# Patient Record
Sex: Male | Born: 1970
Health system: Southern US, Community
[De-identification: ages and names within clinical notes are randomized; demographics above are authoritative.]

## PROBLEM LIST (undated history)

## (undated) DIAGNOSIS — I420 Dilated cardiomyopathy: Secondary | ICD-10-CM

## (undated) DIAGNOSIS — I6529 Occlusion and stenosis of unspecified carotid artery: Secondary | ICD-10-CM

## (undated) DIAGNOSIS — E2609 Other primary hyperaldosteronism: Secondary | ICD-10-CM

## (undated) DIAGNOSIS — I77811 Abdominal aortic ectasia: Secondary | ICD-10-CM

## (undated) DIAGNOSIS — E785 Hyperlipidemia, unspecified: Secondary | ICD-10-CM

## (undated) DIAGNOSIS — I428 Other cardiomyopathies: Secondary | ICD-10-CM

## (undated) DIAGNOSIS — I110 Hypertensive heart disease with heart failure: Secondary | ICD-10-CM

## (undated) DIAGNOSIS — I701 Atherosclerosis of renal artery: Secondary | ICD-10-CM

## (undated) DIAGNOSIS — I2510 Atherosclerotic heart disease of native coronary artery without angina pectoris: Secondary | ICD-10-CM

## (undated) DIAGNOSIS — G40909 Epilepsy, unspecified, not intractable, without status epilepticus: Secondary | ICD-10-CM

## (undated) DIAGNOSIS — I619 Nontraumatic intracerebral hemorrhage, unspecified: Secondary | ICD-10-CM

## (undated) DIAGNOSIS — E119 Type 2 diabetes mellitus without complications: Secondary | ICD-10-CM

## (undated) HISTORY — DX: Occlusion and stenosis of unspecified carotid artery: I65.29

## (undated) HISTORY — DX: Abdominal aortic ectasia: I77.811

## (undated) HISTORY — DX: Dilated cardiomyopathy: I42.0

## (undated) HISTORY — DX: Nontraumatic intracerebral hemorrhage, unspecified: I61.9

## (undated) HISTORY — DX: Atherosclerotic heart disease of native coronary artery without angina pectoris: I25.10

## (undated) HISTORY — DX: Epilepsy, unspecified, not intractable, without status epilepticus: G40.909

## (undated) HISTORY — DX: Atherosclerosis of renal artery: I70.1

## (undated) HISTORY — DX: Type 2 diabetes mellitus without complications: E11.9

## (undated) HISTORY — DX: Hyperlipidemia, unspecified: E78.5

## (undated) HISTORY — DX: Other cardiomyopathies: I42.8

## (undated) HISTORY — DX: Other primary hyperaldosteronism: E26.09

## (undated) HISTORY — DX: Hypertensive heart disease with heart failure: I11.0

---

## 2011-07-10 ENCOUNTER — Other Ambulatory Visit: Payer: Self-pay

## 2011-07-10 ENCOUNTER — Encounter (HOSPITAL_COMMUNITY): Payer: Self-pay | Admitting: Emergency Medicine

## 2011-07-10 ENCOUNTER — Emergency Department (HOSPITAL_COMMUNITY): Payer: Self-pay

## 2011-07-10 ENCOUNTER — Emergency Department (HOSPITAL_COMMUNITY)
Admission: EM | Admit: 2011-07-10 | Discharge: 2011-07-10 | Disposition: A | Discharge status: Home / Self Care | Payer: Self-pay | Attending: Emergency Medicine | Admitting: Emergency Medicine

## 2011-07-10 DIAGNOSIS — I10 Essential (primary) hypertension: Secondary | ICD-10-CM | POA: Insufficient documentation

## 2011-07-10 DIAGNOSIS — R079 Chest pain, unspecified: Secondary | ICD-10-CM | POA: Insufficient documentation

## 2011-07-10 DIAGNOSIS — R0602 Shortness of breath: Secondary | ICD-10-CM | POA: Insufficient documentation

## 2011-07-10 DIAGNOSIS — I498 Other specified cardiac arrhythmias: Secondary | ICD-10-CM | POA: Insufficient documentation

## 2011-07-10 DIAGNOSIS — I471 Supraventricular tachycardia: Secondary | ICD-10-CM

## 2011-07-10 LAB — CBC
HCT: 51 % (ref 39.0–52.0)
MCHC: 35.1 g/dL (ref 30.0–36.0)
RDW: 13.5 % (ref 11.5–15.5)

## 2011-07-10 LAB — BASIC METABOLIC PANEL
BUN: 13 mg/dL (ref 6–23)
Creatinine, Ser: 1.1 mg/dL (ref 0.50–1.35)
GFR calc Af Amer: >90 mL/min (ref >90)
GFR calc non Af Amer: 83 mL/min — ABNORMAL LOW (ref >90)
Potassium: 3 mEq/L — ABNORMAL LOW (ref 3.5–5.1)

## 2011-07-10 MED ORDER — METOPROLOL TARTRATE 25 MG PO TABS: 25.0000 mg | ORAL_TABLET | Freq: Two times a day (BID) | ORAL | Status: DC

## 2011-07-10 MED ORDER — ADENOSINE 6 MG/2ML IV SOLN
INTRAVENOUS | Status: AC
  Filled 2011-07-10: qty 10

## 2011-07-10 MED ORDER — LABETALOL HCL 5 MG/ML IV SOLN
INTRAVENOUS | Status: AC
  Administered 2011-07-10: 10 mg via INTRAVENOUS
  Filled 2011-07-10: qty 4

## 2011-07-10 MED ORDER — CLONIDINE HCL 0.1 MG PO TABS
0.1000 mg | ORAL_TABLET | Freq: Once | ORAL | Status: AC
  Administered 2011-07-10: 0.1 mg via ORAL
  Filled 2011-07-10: qty 1

## 2011-07-10 MED ORDER — LABETALOL HCL 5 MG/ML IV SOLN
10.0000 mg | Freq: Once | INTRAVENOUS | Status: AC
  Administered 2011-07-10: 10 mg via INTRAVENOUS

## 2011-07-10 MED ORDER — LABETALOL HCL 5 MG/ML IV SOLN
INTRAVENOUS | Status: AC
  Filled 2011-07-10: qty 4

## 2011-07-10 MED ORDER — ADENOSINE 6 MG/2ML IV SOLN
6.0000 mg | Freq: Once | INTRAVENOUS | Status: AC
  Administered 2011-07-10: 6 mg via INTRAVENOUS

## 2011-07-10 MED ORDER — ADENOSINE 6 MG/2ML IV SOLN
12.0000 mg | Freq: Once | INTRAVENOUS | Status: AC
  Administered 2011-07-10: 12 mg via INTRAVENOUS

## 2011-07-10 NOTE — ED Notes (Signed)
Dr. Leary Roca is at the bedside.

## 2011-07-10 NOTE — ED Notes (Signed)
Dr. Leary Roca was made aware about patient having persistent elevated BP.

## 2011-07-10 NOTE — ED Notes (Signed)
Blood pressure remains elevated, medicated with Catapres 0.1 mg po as ordered. Will continue to monitor.

## 2011-07-10 NOTE — ED Notes (Signed)
RN notified of elevated B/p

## 2011-07-10 NOTE — ED Notes (Signed)
Pt sts woke up with palpitations, SOB and CP; pt noted to be tachy at present approx 140bpm; pt denies hx of same

## 2011-07-10 NOTE — ED Notes (Signed)
D/c I/v per charge RN

## 2011-07-10 NOTE — ED Provider Notes (Signed)
History     CSN: [id number]  Arrival date & time 07/10/11  1024   First MD Initiated Contact with Patient 07/10/11 1041      Chief Complaint  Patient presents with  . Palpitations  . Shortness of Breath  . Chest Pain    (Consider location/radiation/quality/duration/timing/severity/associated sxs/prior treatment) Patient is a 41 y.o. male presenting with palpitations. The history is provided by the patient.  Palpitations  This is a recurrent problem. The current episode started yesterday. The problem occurs constantly. The problem has been gradually worsening. The problem is associated with an unknown factor. Associated symptoms include irregular heartbeat, headaches (this morning, since resolved) and shortness of breath. Pertinent negatives include no diaphoresis, no fever, no malaise/fatigue, no chest pain, no chest pressure, no claudication, no near-syncope, no abdominal pain, no nausea, no vomiting, no leg pain, no lower extremity edema, no dizziness and no cough. He has tried nothing for the symptoms. Risk factors include smoking/tobacco exposure and being male. His past medical history does not include heart disease or hyperthyroidism.    History reviewed. No pertinent past medical history.  History reviewed. No pertinent past surgical history.  History reviewed. No pertinent family history.  History  Substance Use Topics  . Smoking status: Current Everyday Smoker  . Smokeless tobacco: Not on file  . Alcohol Use: Yes     occasional      Review of Systems  Unable to perform ROS Constitutional: Negative for fever, chills, malaise/fatigue and diaphoresis.  Respiratory: Positive for shortness of breath. Negative for cough and chest tightness.   Cardiovascular: Positive for palpitations. Negative for chest pain, claudication and near-syncope.  Gastrointestinal: Negative for nausea, vomiting and abdominal pain.  Musculoskeletal: Negative for myalgias and arthralgias.  Skin:  Negative for pallor and rash.  Neurological: Positive for headaches (this morning, since resolved). Negative for dizziness and light-headedness.  All other systems reviewed and are negative.    Allergies  Review of patient's allergies indicates no known allergies.  Home Medications   Current Outpatient Rx  Name Route Sig Dispense Refill  . ASPIRIN EC 81 MG PO TBEC Oral Take 81 mg by mouth daily as needed. For pain      Pulse 140  SpO2 100%  Physical Exam  Nursing note and vitals reviewed. Constitutional: He is oriented to person, place, and time. He appears well-developed and well-nourished.  HENT:  Head: Normocephalic and atraumatic.  Eyes: EOM are normal. Pupils are equal, round, and reactive to light.  Cardiovascular: Regular rhythm, normal heart sounds and intact distal pulses.  Tachycardia present.   Pulmonary/Chest: Effort normal and breath sounds normal. No respiratory distress.  Abdominal: Soft. There is no tenderness.  Neurological: He is alert and oriented to person, place, and time.  Skin: Skin is warm and dry.  Psychiatric: He has a normal mood and affect.    ED Course  Procedures (including critical care time)  Labs Reviewed  CBC - Abnormal; Notable for the following:    RBC 6.29 (*)     Hemoglobin 17.9 (*)     All other components within normal limits  BASIC METABOLIC PANEL - Abnormal; Notable for the following:    Potassium 3.0 (*)     Glucose, Bld 123 (*)     GFR calc non Af Amer 83 (*)     All other components within normal limits  POCT I-STAT TROPONIN I   Dg Chest 2 View  07/10/2011  *RADIOLOGY REPORT*  Clinical Data: Palpitations and shortness  of breath/chest pain.  CHEST - 2 VIEW  Comparison: None.  Findings: Midline trachea.  Mild cardiomegaly with tortuous thoracic aorta. Mediastinal contours otherwise within normal limits.  No pleural effusion or pneumothorax.  EKG lead artifacts project over the upper lobes bilaterally.  Mild interstitial  prominence.  IMPRESSION: Cardiomegaly.  Interstitial prominence suspicious for pulmonary venous congestion.  Original Report Authenticated By: Consuello Bossier, M.D.     Date: 07/10/2011 @1029   Rate: 139  Rhythm: supraventricular tachycardia (SVT)  QRS Axis: left  Intervals: normal  ST/T Wave abnormalities: ST depressions inferiorly and ST depressions laterally V4-V6, II, III, aVF  Conduction Disutrbances:none  Narrative Interpretation:   Old EKG Reviewed: none available   Date: 07/10/2011 @1129   Rate: 83  Rhythm: normal sinus rhythm  QRS Axis: normal  Intervals: PR prolonged  ST/T Wave abnormalities: nonspecific T wave changes, ST depressions inferiorly and ST depressions laterally <1 mm ST depression in II, V5-V6; TWI V4, III, aVF  Conduction Disutrbances:first-degree A-V block   Narrative Interpretation:   Old EKG Reviewed: changes noted p waves present, improvement of ST depressions    1. SVT (supraventricular tachycardia)   2. Hypertension       MDM  This is a 41 year old male who presents with palpitations and feeling like his heart is racing since last night, with mild headache this morning that has since resolved, and feelings of shortness of breath but still persists. The patient states that he has previously had similar episodes of palpitations, however they have not been as severe, they resolved on their own, and were not associated with any shortness of breath. He denies any chest pain at any point in time during this episode. He denies any drug use, over-the-counter medications, any recent infectious symptoms, any previous diagnosed history of dysrhythmias, or any known family history of dysrhythmias. He states he takes an intermittent PPS, but no other medications. He says he has previously been told he had hypertension, however has never been started on any medications. At this time the patient's overall well-appearing, is tachycardic with an EKG showing SVT; there are  some ST depressions noted, however at this point in time he'll consider they are most likely rate related. Patient was given 6 mg of adenosine without any benefit, followed by 12 mg of adenosine which resulted in conversion to a normal sinus rhythm. The patient tolerated this well without problems. A repeat EKG does still show some mild depressions, however if he rebleeds and with improvement in the depth of the depressions. The patient is persistently hypertensive, so will give labetalol to help treat this. We'll check basic labs to evaluate for possible etiology of his new SVT.  Labs unremarkable. Chest x-ray does show cardiomegaly and some possible pulmonary venous congestion. This is consistent with the patient's hypertension, which is likely chronic. Spoke with Trish from [facility] cardiology due to his new-onset SVT, hypertension, and cardiomegaly on chest x-ray. They will call the patient in the morning to set up close followup with him. In the interim we'll start the patient on low-dose beta blocker to help control his blood pressure as well as his heart rate. Advised patient to check his blood pressure as he is able, to keep track of these numbers, and the importance of followup with cardiology. The patient expresses understanding of this plan.    Theotis Burrow, MD 07/10/11 [phone]

## 2011-07-11 NOTE — ED Provider Notes (Signed)
I saw and evaluated the patient, reviewed the resident's note and I agree with the findings and plan.   Loren Racer, MD 07/11/11 [phone]

## 2012-04-14 ENCOUNTER — Encounter (HOSPITAL_COMMUNITY): Payer: Self-pay | Admitting: Emergency Medicine

## 2012-04-14 ENCOUNTER — Emergency Department (HOSPITAL_COMMUNITY)
Admission: EM | Admit: 2012-04-14 | Discharge: 2012-04-14 | Disposition: A | Discharge status: Home / Self Care | Payer: Self-pay | Attending: Emergency Medicine | Admitting: Emergency Medicine

## 2012-04-14 DIAGNOSIS — F411 Generalized anxiety disorder: Secondary | ICD-10-CM | POA: Insufficient documentation

## 2012-04-14 DIAGNOSIS — M5430 Sciatica, unspecified side: Secondary | ICD-10-CM | POA: Insufficient documentation

## 2012-04-14 DIAGNOSIS — F17200 Nicotine dependence, unspecified, uncomplicated: Secondary | ICD-10-CM | POA: Insufficient documentation

## 2012-04-14 DIAGNOSIS — I10 Essential (primary) hypertension: Secondary | ICD-10-CM | POA: Insufficient documentation

## 2012-04-14 DIAGNOSIS — M5432 Sciatica, left side: Secondary | ICD-10-CM

## 2012-04-14 DIAGNOSIS — M79609 Pain in unspecified limb: Secondary | ICD-10-CM | POA: Insufficient documentation

## 2012-04-14 MED ORDER — LISINOPRIL 20 MG PO TABS
20.0000 mg | ORAL_TABLET | Freq: Once | ORAL | Status: AC
  Administered 2012-04-14: 20 mg via ORAL
  Filled 2012-04-14: qty 1

## 2012-04-14 MED ORDER — LISINOPRIL 20 MG PO TABS: 20.0000 mg | ORAL_TABLET | Freq: Every day | ORAL | Status: DC

## 2012-04-14 MED ORDER — CYCLOBENZAPRINE HCL 10 MG PO TABS
5.0000 mg | ORAL_TABLET | Freq: Once | ORAL | Status: AC
  Administered 2012-04-14: 5 mg via ORAL
  Filled 2012-04-14: qty 1

## 2012-04-14 MED ORDER — IBUPROFEN 800 MG PO TABS: 800.0000 mg | ORAL_TABLET | Freq: Three times a day (TID) | ORAL | Status: DC

## 2012-04-14 MED ORDER — CYCLOBENZAPRINE HCL 10 MG PO TABS: 10.0000 mg | ORAL_TABLET | Freq: Two times a day (BID) | ORAL | Status: DC | PRN

## 2012-04-14 MED ORDER — FENTANYL CITRATE 0.05 MG/ML IJ SOLN
50.0000 ug | Freq: Once | INTRAMUSCULAR | Status: AC
  Administered 2012-04-14: 50 ug via INTRAMUSCULAR
  Filled 2012-04-14: qty 2

## 2012-04-14 MED ORDER — METOPROLOL TARTRATE 50 MG PO TABS: 50.0000 mg | ORAL_TABLET | Freq: Two times a day (BID) | ORAL | Status: DC

## 2012-04-14 MED ORDER — METOPROLOL TARTRATE 25 MG PO TABS
50.0000 mg | ORAL_TABLET | Freq: Once | ORAL | Status: AC
  Administered 2012-04-14: 50 mg via ORAL
  Filled 2012-04-14: qty 2

## 2012-04-14 NOTE — ED Provider Notes (Signed)
History     CSN: [id number]  Arrival date & time 04/14/12  0814   First MD Initiated Contact with Patient 04/14/12 [phone]      Chief Complaint  Patient presents with  . Back Pain  . Leg Pain    (Consider location/radiation/quality/duration/timing/severity/associated sxs/prior treatment) The history is provided by the patient.  Steven Higgins is a 42 y.o. male history of hypertension, medication noncompliance, here with left leg pain. Start with back pain that radiated down to the left leg for the last 3 weeks. Denies injury or trauma. He said the pain radiated down to his toes on the left leg. No fevers or chills or urinary incontinence or bowel incontinence. Denies vomiting. He has not been taking his metoprolol for over 3 months because he cannot follow up with a doctor due to lack of insurance. No chest pain or SOB.    Past Medical History  Diagnosis Date  . Hypertension     History reviewed. No pertinent past surgical history.  History reviewed. No pertinent family history.  History  Substance Use Topics  . Smoking status: Current Every Day Smoker  . Smokeless tobacco: Not on file  . Alcohol Use: Yes     Comment: occasional      Review of Systems  Musculoskeletal: Positive for back pain.       Leg pain   All other systems reviewed and are negative.    Allergies  Review of patient's allergies indicates no known allergies.  Home Medications   Current Outpatient Rx  Name  Route  Sig  Dispense  Refill  . aspirin EC 81 MG tablet   Oral   Take 81 mg by mouth daily as needed. For pain         . ibuprofen (ADVIL,MOTRIN) 200 MG tablet   Oral   Take 400-600 mg by mouth 3 (three) times daily as needed for pain.         . Menthol-Methyl Salicylate (MUSCLE RUB EX)   Apply externally   Apply 1 application topically every 4 (four) hours as needed (pain).           BP 198/125  Pulse 63  Temp(Src) 97.4 F (36.3 C) (Oral)  Resp 18  SpO2 98%  Physical  Exam  Nursing note and vitals reviewed. Constitutional: He is oriented to person, place, and time. He appears well-developed and well-nourished.  Slightly anxious, uncomfortable   HENT:  Head: Normocephalic.  Mouth/Throat: Oropharynx is clear and moist.  Eyes: Conjunctivae are normal. Pupils are equal, round, and reactive to light.  Neck: Normal range of motion. Neck supple.  Cardiovascular: Normal rate, regular rhythm and normal heart sounds.   Pulmonary/Chest: Effort normal and breath sounds normal. No respiratory distress. He has no wheezes. He has no rales.  Abdominal: Soft. Bowel sounds are normal. He exhibits no distension. There is no tenderness. There is no rebound and no guarding.  Musculoskeletal: Normal range of motion.  Positive straight leg raise on L side, + L paralumbar tenderness   Neurological: He is alert and oriented to person, place, and time.  No saddle anesthesia   Skin: Skin is warm and dry.  Psychiatric: He has a normal mood and affect. His behavior is normal. Judgment and thought content normal.    ED Course  Procedures (including critical care time)  Labs Reviewed - No data to display No results found.   No diagnosis found.    MDM  Steven Higgins is a 42 y.o.  male here with L leg pain. Likely sciatica. Also hypertensive but no evidence of end organ damage. Will give pain meds and bp meds and reassess.   11:36 AM Pain improved, BP improved with metoprolol. Will d/c home on metoprolol and lisinopril. Gave him a list of PMD to f/u with. Also prescribed motrin and flexeril for sciatica. Return precautions given.          Richardean Canal, MD 04/14/12 [phone]

## 2012-04-14 NOTE — ED Notes (Signed)
Pt sts left lower back pain with radiation down left leg x 3 weeks; pt noted to be hypertensive and sts has not had meds x 3 months

## 2012-04-21 ENCOUNTER — Emergency Department (HOSPITAL_COMMUNITY)
Admission: EM | Admit: 2012-04-21 | Discharge: 2012-04-21 | Disposition: A | Discharge status: Home / Self Care | Payer: Self-pay | Attending: Emergency Medicine | Admitting: Emergency Medicine

## 2012-04-21 ENCOUNTER — Encounter (HOSPITAL_COMMUNITY): Payer: Self-pay | Admitting: *Deleted

## 2012-04-21 DIAGNOSIS — F17200 Nicotine dependence, unspecified, uncomplicated: Secondary | ICD-10-CM | POA: Insufficient documentation

## 2012-04-21 DIAGNOSIS — M549 Dorsalgia, unspecified: Secondary | ICD-10-CM | POA: Insufficient documentation

## 2012-04-21 DIAGNOSIS — M5430 Sciatica, unspecified side: Secondary | ICD-10-CM | POA: Insufficient documentation

## 2012-04-21 DIAGNOSIS — R32 Unspecified urinary incontinence: Secondary | ICD-10-CM | POA: Insufficient documentation

## 2012-04-21 DIAGNOSIS — I10 Essential (primary) hypertension: Secondary | ICD-10-CM

## 2012-04-21 DIAGNOSIS — E876 Hypokalemia: Secondary | ICD-10-CM

## 2012-04-21 DIAGNOSIS — M5432 Sciatica, left side: Secondary | ICD-10-CM

## 2012-04-21 LAB — POCT I-STAT, CHEM 8
BUN: 12 mg/dL (ref 6–23)
Chloride: 95 mEq/L — ABNORMAL LOW (ref 96–112)
HCT: 53 % — ABNORMAL HIGH (ref 39.0–52.0)
Sodium: 140 mEq/L (ref 135–145)
TCO2: 37 mmol/L (ref 0–100)

## 2012-04-21 MED ORDER — ONDANSETRON 4 MG PO TBDP
4.0000 mg | ORAL_TABLET | Freq: Once | ORAL | Status: AC
  Administered 2012-04-21: 4 mg via ORAL
  Filled 2012-04-21: qty 1

## 2012-04-21 MED ORDER — PREDNISONE 20 MG PO TABS: 40.0000 mg | ORAL_TABLET | Freq: Every day | ORAL | Status: DC

## 2012-04-21 MED ORDER — OXYCODONE-ACETAMINOPHEN 5-325 MG PO TABS: 1.0000 | ORAL_TABLET | Freq: Four times a day (QID) | ORAL | Status: DC | PRN

## 2012-04-21 MED ORDER — METOPROLOL TARTRATE 25 MG PO TABS
50.0000 mg | ORAL_TABLET | Freq: Two times a day (BID) | ORAL | Status: DC
  Administered 2012-04-21: 50 mg via ORAL
  Filled 2012-04-21: qty 2

## 2012-04-21 MED ORDER — HYDROMORPHONE HCL PF 2 MG/ML IJ SOLN
2.0000 mg | Freq: Once | INTRAMUSCULAR | Status: AC
  Administered 2012-04-21: 2 mg via INTRAMUSCULAR
  Filled 2012-04-21: qty 1

## 2012-04-21 MED ORDER — POTASSIUM CHLORIDE CRYS ER 20 MEQ PO TBCR
80.0000 meq | EXTENDED_RELEASE_TABLET | Freq: Once | ORAL | Status: AC
  Administered 2012-04-21: 80 meq via ORAL
  Filled 2012-04-21: qty 4

## 2012-04-21 MED ORDER — POTASSIUM CHLORIDE CRYS ER 20 MEQ PO TBCR: 20.0000 meq | EXTENDED_RELEASE_TABLET | Freq: Two times a day (BID) | ORAL | Status: DC

## 2012-04-21 NOTE — ED Notes (Signed)
MD at bedside. 

## 2012-04-21 NOTE — ED Notes (Signed)
Dr. Anitra Lauth made aware of pts. Pain and hypertension

## 2012-04-21 NOTE — ED Notes (Signed)
Patient moved to room 29  Report given

## 2012-04-21 NOTE — ED Notes (Signed)
Pt is here with left leg pain that starts at lower buttocks area and down leg.  Strong pedal pulse

## 2012-04-21 NOTE — ED Provider Notes (Addendum)
History     CSN: [id number]  Arrival date & time 04/21/12  1541   First MD Initiated Contact with Patient 04/21/12 1811      Chief Complaint  Patient presents with  . Leg Pain    (Consider location/radiation/quality/duration/timing/severity/associated sxs/prior treatment) Patient is a 42 y.o. male presenting with leg pain. The history is provided by the patient.  Leg Pain Location:  Buttock Time since incident:  2 weeks Injury: no   Buttock location:  L buttock Pain details:    Quality:  Throbbing, sharp, burning and aching   Radiates to:  L leg (to the outer 3 toes)   Severity:  Severe   Onset quality:  Gradual   Duration:  2 weeks   Timing:  Constant   Progression:  Worsening Chronicity:  New Prior injury to area:  No Relieved by:  Nothing Worsened by:  Bearing weight and activity Ineffective treatments:  NSAIDs and muscle relaxant Associated symptoms: back pain   Associated symptoms: no decreased ROM, no fever, no muscle weakness, no neck pain, no numbness, no swelling and no tingling   Associated symptoms comment:  Urinary incontinence or retention. No bowel incontinence Risk factors: no frequent fractures and no recent illness     Past Medical History  Diagnosis Date  . Hypertension     History reviewed. No pertinent past surgical history.  No family history on file.  History  Substance Use Topics  . Smoking status: Current Every Day Smoker  . Smokeless tobacco: Not on file  . Alcohol Use: Yes     Comment: occasional      Review of Systems  Constitutional: Negative for fever.  HENT: Negative for neck pain.   Respiratory: Negative for cough and shortness of breath.   Cardiovascular: Negative for chest pain.       Pt states his BP is running high since the pain started.  170's last night.  Musculoskeletal: Positive for back pain.  Neurological: Negative for headaches.  All other systems reviewed and are negative.    Allergies  Review of  patient's allergies indicates no known allergies.  Home Medications   Current Outpatient Rx  Name  Route  Sig  Dispense  Refill  . cyclobenzaprine (FLEXERIL) 10 MG tablet   Oral   Take 1 tablet (10 mg total) by mouth 2 (two) times daily as needed for muscle spasms.   20 tablet   0   . ibuprofen (ADVIL,MOTRIN) 800 MG tablet   Oral   Take 1 tablet (800 mg total) by mouth 3 (three) times daily.   21 tablet   0   . lisinopril (PRINIVIL,ZESTRIL) 20 MG tablet   Oral   Take 1 tablet (20 mg total) by mouth daily.   30 tablet   0   . metoprolol (LOPRESSOR) 50 MG tablet   Oral   Take 1 tablet (50 mg total) by mouth 2 (two) times daily.   60 tablet   0     BP 219/145  Pulse 61  Temp(Src) 98 F (36.7 C) (Oral)  Resp 14  SpO2 98%  Physical Exam  Nursing note and vitals reviewed. Constitutional: He is oriented to person, place, and time. He appears well-developed and well-nourished. He appears distressed.  HENT:  Head: Normocephalic and atraumatic.  Mouth/Throat: Oropharynx is clear and moist.  Eyes: Conjunctivae and EOM are normal. Pupils are equal, round, and reactive to light.  Neck: Normal range of motion. Neck supple.  Cardiovascular: Normal rate, regular  rhythm and intact distal pulses.   No murmur heard. Pulmonary/Chest: Effort normal and breath sounds normal. No respiratory distress. He has no wheezes. He has no rales.  Abdominal: Soft. He exhibits no distension. There is no tenderness. There is no rebound and no guarding.  Musculoskeletal: Normal range of motion. He exhibits no edema and no tenderness.       Lumbar back: He exhibits tenderness, pain and spasm. He exhibits normal pulse.       Back:  Neurological: He is alert and oriented to person, place, and time. He has normal strength. No sensory deficit.  Reflex Scores:      Patellar reflexes are 2+ on the left side. Skin: Skin is warm and dry. No rash noted. No erythema.  Psychiatric: He has a normal mood and  affect. His behavior is normal.    ED Course  Procedures (including critical care time)  Labs Reviewed  POCT I-STAT, CHEM 8 - Abnormal; Notable for the following:    Potassium 2.6 (*)    Chloride 95 (*)    Glucose, Bld 111 (*)    Calcium, Ion 1.09 (*)    Hemoglobin 18.0 (*)    HCT 53.0 (*)    All other components within normal limits   No results found.   Date: 04/21/2012  Rate: 70  Rhythm: normal sinus rhythm  QRS Axis: normal  Intervals: normal  ST/T Wave abnormalities: nonspecific ST/T changes  Conduction Disutrbances:first-degree A-V block  and LVH  Narrative Interpretation:   Old EKG Reviewed: unchanged   1. Hypertension   2. Hypokalemia   3. Sciatica of left side       MDM   Pt with gradual onset of back pain suggestive of radiculopathy.  No neurovascular compromise and no incontinence.  Pt has no infectious sx, hx of CA  or other red flags concerning for pathologic back pain.  Pt is able to ambulate but is painful.  Normal strength and reflexes on exam.  Denies trauma.  Patient seen approximately last week has been taking ibuprofen and Flexeril without improvement in his sciatica-type symptoms Will give pt pain control and to return for developement of above sx. Also patient is hypertensive today but is asymptomatic from his hypertension. EKG shows signs of LVH but no acute findings. He denies any chest pain, shortness of breath. He has taken his blood pressure medications today.  8:36 PM Pt feeling much better after pain meds.  Given evening dose of his meds.  Repeat BP 191/120.  Still assymptomatic. I-stat with hypokalemia of 2.6 which is most likely from lisinopril.  Orally replaced and pt will be given supplement.    Gwyneth Sprout, MD 04/21/12 1610  Gwyneth Sprout, MD 04/21/12 2131

## 2012-04-21 NOTE — ED Notes (Signed)
Pt was told to come here for high blood pressure and pt  Is also complaining of headache

## 2012-04-21 NOTE — ED Notes (Signed)
Pt. States, "bp up b/c on severe pain."

## 2012-04-22 MED ORDER — OXYCODONE-ACETAMINOPHEN 5-325 MG PO TABS: 1.0000 | ORAL_TABLET | Freq: Four times a day (QID) | ORAL | Status: DC | PRN

## 2012-04-22 MED ORDER — PREDNISONE 20 MG PO TABS: 40.0000 mg | ORAL_TABLET | Freq: Every day | ORAL | Status: DC

## 2012-04-22 MED ORDER — POTASSIUM CHLORIDE CRYS ER 20 MEQ PO TBCR: 20.0000 meq | EXTENDED_RELEASE_TABLET | Freq: Two times a day (BID) | ORAL | Status: DC

## 2016-03-15 ENCOUNTER — Emergency Department (HOSPITAL_COMMUNITY): Payer: Self-pay

## 2016-03-15 ENCOUNTER — Encounter (HOSPITAL_COMMUNITY): Payer: Self-pay

## 2016-03-15 ENCOUNTER — Inpatient Hospital Stay (HOSPITAL_COMMUNITY)
Admission: EM | Admit: 2016-03-15 | Discharge: 2016-03-20 | DRG: 065 | Disposition: A | Discharge status: Home / Self Care | Payer: Self-pay | Attending: Neurology | Admitting: Neurology

## 2016-03-15 DIAGNOSIS — E876 Hypokalemia: Secondary | ICD-10-CM | POA: Diagnosis present

## 2016-03-15 DIAGNOSIS — N179 Acute kidney failure, unspecified: Secondary | ICD-10-CM | POA: Diagnosis present

## 2016-03-15 DIAGNOSIS — I629 Nontraumatic intracranial hemorrhage, unspecified: Secondary | ICD-10-CM

## 2016-03-15 DIAGNOSIS — R52 Pain, unspecified: Secondary | ICD-10-CM

## 2016-03-15 DIAGNOSIS — R569 Unspecified convulsions: Secondary | ICD-10-CM | POA: Diagnosis present

## 2016-03-15 DIAGNOSIS — F17210 Nicotine dependence, cigarettes, uncomplicated: Secondary | ICD-10-CM | POA: Diagnosis present

## 2016-03-15 DIAGNOSIS — Z79899 Other long term (current) drug therapy: Secondary | ICD-10-CM

## 2016-03-15 DIAGNOSIS — W19XXXA Unspecified fall, initial encounter: Secondary | ICD-10-CM | POA: Diagnosis present

## 2016-03-15 DIAGNOSIS — I611 Nontraumatic intracerebral hemorrhage in hemisphere, cortical: Principal | ICD-10-CM | POA: Diagnosis present

## 2016-03-15 DIAGNOSIS — I161 Hypertensive emergency: Secondary | ICD-10-CM | POA: Diagnosis present

## 2016-03-15 DIAGNOSIS — E785 Hyperlipidemia, unspecified: Secondary | ICD-10-CM | POA: Diagnosis present

## 2016-03-15 DIAGNOSIS — M25559 Pain in unspecified hip: Secondary | ICD-10-CM | POA: Diagnosis present

## 2016-03-15 DIAGNOSIS — R072 Precordial pain: Secondary | ICD-10-CM

## 2016-03-15 DIAGNOSIS — M310 Hypersensitivity angiitis: Secondary | ICD-10-CM | POA: Diagnosis present

## 2016-03-15 DIAGNOSIS — I619 Nontraumatic intracerebral hemorrhage, unspecified: Secondary | ICD-10-CM | POA: Diagnosis present

## 2016-03-15 DIAGNOSIS — Z720 Tobacco use: Secondary | ICD-10-CM | POA: Diagnosis present

## 2016-03-15 DIAGNOSIS — M549 Dorsalgia, unspecified: Secondary | ICD-10-CM | POA: Diagnosis present

## 2016-03-15 DIAGNOSIS — I701 Atherosclerosis of renal artery: Secondary | ICD-10-CM | POA: Diagnosis present

## 2016-03-15 DIAGNOSIS — I10 Essential (primary) hypertension: Secondary | ICD-10-CM | POA: Diagnosis present

## 2016-03-15 LAB — URINALYSIS, ROUTINE W REFLEX MICROSCOPIC
Bilirubin Urine: NEGATIVE
GLUCOSE, UA: NEGATIVE mg/dL
KETONES UR: NEGATIVE mg/dL
LEUKOCYTES UA: NEGATIVE
NITRITE: NEGATIVE
PH: 5 (ref 5.0–8.0)
Protein, ur: 100 mg/dL — AB
SPECIFIC GRAVITY, URINE: 1.01 (ref 1.005–1.030)
Squamous Epithelial / LPF: NONE SEEN

## 2016-03-15 LAB — COMPREHENSIVE METABOLIC PANEL
ALBUMIN: 3.9 g/dL (ref 3.5–5.0)
ALT: 36 U/L (ref 17–63)
AST: 35 U/L (ref 15–41)
Alkaline Phosphatase: 57 U/L (ref 38–126)
Anion gap: 10 (ref 5–15)
BUN: 14 mg/dL (ref 6–20)
CHLORIDE: 100 mmol/L — AB (ref 101–111)
CO2: 27 mmol/L (ref 22–32)
Calcium: 9 mg/dL (ref 8.9–10.3)
Creatinine, Ser: 1.58 mg/dL — ABNORMAL HIGH (ref 0.61–1.24)
GFR calc non Af Amer: 52 mL/min — ABNORMAL LOW (ref >60)
Glucose, Bld: 130 mg/dL — ABNORMAL HIGH (ref 65–99)
POTASSIUM: 2.5 mmol/L — AB (ref 3.5–5.1)
SODIUM: 137 mmol/L (ref 135–145)
Total Bilirubin: 0.5 mg/dL (ref 0.3–1.2)
Total Protein: 6.6 g/dL (ref 6.5–8.1)

## 2016-03-15 LAB — CBC WITH DIFFERENTIAL/PLATELET
Basophils Absolute: 0 10*3/uL (ref 0.0–0.1)
Basophils Relative: 0 %
EOS PCT: 1 %
Eosinophils Absolute: 0.1 10*3/uL (ref 0.0–0.7)
HEMATOCRIT: 44.1 % (ref 39.0–52.0)
Hemoglobin: 14.9 g/dL (ref 13.0–17.0)
LYMPHS ABS: 1.5 10*3/uL (ref 0.7–4.0)
LYMPHS PCT: 17 %
MCH: 27.5 pg (ref 26.0–34.0)
MCHC: 33.8 g/dL (ref 30.0–36.0)
MCV: 81.5 fL (ref 78.0–100.0)
Monocytes Absolute: 0.5 10*3/uL (ref 0.1–1.0)
Monocytes Relative: 6 %
NEUTROS ABS: 6.4 10*3/uL (ref 1.7–7.7)
Neutrophils Relative %: 76 %
PLATELETS: 188 10*3/uL (ref 150–400)
RBC: 5.41 MIL/uL (ref 4.22–5.81)
RDW: 13.4 % (ref 11.5–15.5)
WBC: 8.4 10*3/uL (ref 4.0–10.5)

## 2016-03-15 LAB — I-STAT TROPONIN, ED: Troponin i, poc: 0.07 ng/mL (ref 0.00–0.08)

## 2016-03-15 LAB — RAPID URINE DRUG SCREEN, HOSP PERFORMED
Amphetamines: NOT DETECTED
BARBITURATES: NOT DETECTED
Benzodiazepines: NOT DETECTED
Cocaine: NOT DETECTED
Opiates: NOT DETECTED
TETRAHYDROCANNABINOL: NOT DETECTED

## 2016-03-15 MED ORDER — POTASSIUM CHLORIDE 20 MEQ/15ML (10%) PO SOLN: 40.0000 meq | Freq: Every day | ORAL | Status: DC

## 2016-03-15 MED ORDER — NICARDIPINE HCL IN NACL 20-0.86 MG/200ML-% IV SOLN
3.0000 mg/h | INTRAVENOUS | Status: DC
  Administered 2016-03-15 – 2016-03-16 (×2): 5 mg/h via INTRAVENOUS
  Administered 2016-03-16: 7.5 mg/h via INTRAVENOUS
  Administered 2016-03-16: 2.5 mg/h via INTRAVENOUS
  Filled 2016-03-15 (×5): qty 200

## 2016-03-15 MED ORDER — MORPHINE SULFATE (PF) 4 MG/ML IV SOLN
4.0000 mg | Freq: Once | INTRAVENOUS | Status: AC
  Administered 2016-03-15: 4 mg via INTRAVENOUS
  Filled 2016-03-15: qty 1

## 2016-03-15 MED ORDER — POTASSIUM CHLORIDE 20 MEQ/15ML (10%) PO SOLN
60.0000 meq | Freq: Every day | ORAL | Status: DC
  Administered 2016-03-16: 60 meq via ORAL
  Filled 2016-03-15: qty 45

## 2016-03-15 MED ORDER — SODIUM CHLORIDE 0.9 % IV BOLUS (SEPSIS)
1000.0000 mL | Freq: Once | INTRAVENOUS | Status: AC
  Administered 2016-03-15: 1000 mL via INTRAVENOUS

## 2016-03-15 MED ORDER — POTASSIUM CHLORIDE CRYS ER 20 MEQ PO TBCR
40.0000 meq | EXTENDED_RELEASE_TABLET | Freq: Once | ORAL | Status: AC
  Administered 2016-03-15: 40 meq via ORAL
  Filled 2016-03-15: qty 2

## 2016-03-15 MED ORDER — LABETALOL HCL 5 MG/ML IV SOLN
10.0000 mg | Freq: Once | INTRAVENOUS | Status: AC
  Administered 2016-03-15: 10 mg via INTRAVENOUS
  Filled 2016-03-15: qty 4

## 2016-03-15 NOTE — ED Notes (Signed)
Patient transported to CT 

## 2016-03-15 NOTE — ED Notes (Signed)
Pt returned from radiology.

## 2016-03-15 NOTE — ED Notes (Signed)
Patient transported to X-ray 

## 2016-03-15 NOTE — H&P (Addendum)
Neurology H&P  CC: Seizure  History is obtained from: Patient, family  HPI: Steven Higgins is a 46 y.o. male who was in his normal state of health until earlier this evening when he had a seizure. Family member describes that he was extended and had his eyes turned though she is not certain which way. He was shaking and bit his tongue.  He has since returned to baseline.  On evaluation for this in the emergency department a CT was performed which shows a small hemorrhage in the left parietal region.  This was unclear on initial CT and therefore an MRI was performed which does demonstrate a small acute hemorrhage.  He does not have any symptoms from this, and therefore I'm not certain we can say when it happened but likely I would suspect that the seizure happened at onset.  Of note, he has long-standing hypertension but has not taken his medicines and 3 years because the one that he was prescribed was $100 per month.  LKW: Unclear tpa given?: no, ICH ICH Score: 0  ROS: A 14 point ROS was performed and is negative except as noted in the HPI.   Past Medical History:  Diagnosis Date  . Hypertension      Family history: Mother-hypertension   Social History:  reports that he has been smoking Cigarettes.  He has been smoking about 0.25 packs per day. He uses smokeless tobacco. He reports that he drinks alcohol. He reports that he does not use drugs.   Exam: Current vital signs: BP (!) 193/116   Pulse 83   Resp 20   Ht 6\' 6"  (1.981 m)   Wt 97.5 kg (215 lb)   SpO2 91%   BMI 24.85 kg/m  Vital signs in last 24 hours: Pulse Rate:  [82-88] 83 (03/15 2215) Resp:  [17-25] 20 (03/15 2215) BP: (146-196)/(100-123) 193/116 (03/15 2215) SpO2:  [91 %-96 %] 91 % (03/15 2215) Weight:  [97.5 kg (215 lb)] 97.5 kg (215 lb) (03/15 1910)  Physical Exam  Constitutional: Appears well-developed and well-nourished.  Psych: Affect appropriate to situation Eyes: No scleral injection HENT: No  OP obstrucion Head: Normocephalic.  Cardiovascular: Normal rate and regular rhythm.  Respiratory: Effort normal and breath sounds normal to anterior ascultation GI: Soft.  No distension. There is no tenderness.  Skin: WDI  Neuro: Mental Status: Patient is awake, alert, oriented to person, place, month, year, and situation. Patient is able to give a clear and coherent history. No signs of aphasia or neglect Cranial Nerves: II: Visual Fields are full. Pupils are equal, round, and reactive to light.   III,IV, VI: EOMI without ptosis or diploplia.  V: Facial sensation is symmetric to temperature VII: Facial movement is symmetric.  VIII: hearing is intact to voice X: Uvula elevates symmetrically XI: Shoulder shrug is symmetric. XII: tongue is midline without atrophy or fasciculations.  Motor: Tone is normal. Bulk is normal. 5/5 strength was present in all four extremities.  Sensory: Sensation is symmetric to light touch and temperature in the arms and legs. Cerebellar: FNF intact bilaterally   I have reviewed labs in epic and the results pertinent to this consultation are: Hypokalemia  I have reviewed the images obtained: CT head-small hemorrhage in the left parietal region  Impression: 46 year old man with severe long-standing hypertension who presents with small hemorrhage and left parietal region resulting in seizure. I would favor at least short-term antiepileptics.   Though the hemorrhage is relatively small at this time, given his  severe hypertension I would favor admission to the ICU for aggressive blood pressure management to prevent enlargement.  Given evidence of AKI, small size of hemorrhage, long-standing hypertension I would favor using a slightly higher goal of 140-160 systolic rather than the typical 120-140.  He also has long-standing hypokalemia, and I wonder if he has renal artery stenosis resulting in both severe hypertension and  hyperaldosteronism.  Recommendations: 1) Admit to ICU 2) no antiplatelets or anticoagulants 3) blood pressure control with goal systolic 140 - 160 4) Frequent neuro checks 5) If symptoms worsen or there is decreased mental status, repeat stat head CT 6) PT,OT,ST 7) renal artery ultrasound 8) For hypokalemia, will give 60 mEq potassium immediate release and 40 mEq long-acting, recheck BMP in the morning 9) nicardipine for blood pressure control 10) keppra 500mg  bid   This patient is critically ill and at significant risk of neurological worsening, death and care requires constant monitoring of vital signs, hemodynamics,respiratory and cardiac monitoring, neurological assessment, discussion with family, other specialists and medical decision making of high complexity. I spent 45 minutes of neurocritical care time  in the care of  this patient.  Ritta Slot, MD [facility] [phone]  If 7pm- 7am, please page neurology on call as listed in [facility]ION. 03/15/2016  10:30 PM

## 2016-03-15 NOTE — ED Notes (Signed)
Blood hemolyzed after obtaining from IV site, EMT to get blood

## 2016-03-15 NOTE — ED Notes (Signed)
MD made aware of blood pressures. Pt stated he has been off his blood pressure medications for at least three years.

## 2016-03-15 NOTE — ED Triage Notes (Signed)
Pt from home with [facility]. Pt started to feel sick to his stomach, went to restroom and sister went to check on pt in bathroom after hearing a "bang". Pt was found leaning against the sink from the toilet "shaking".  Sister helped lower pt to the floor and called EMS. Pt denies hx of seizures, only hx of htn. Pt c/o neck and lower back pain and frontal HA. Denies SHOB, Nausea or dizziness. Towel applied to pt neck due to c collar not fitting. VSS NAD at this time. Pt alert and oriented x4.

## 2016-03-15 NOTE — ED Notes (Addendum)
EMT will get blood when pt returns from xray.

## 2016-03-15 NOTE — ED Provider Notes (Signed)
Emergency Department Provider Note   I have reviewed the triage vital signs and the nursing notes.   HISTORY  Chief Complaint Seizures   HPI Steven Higgins is a 46 y.o. male with PMH of HTN presents to the emergency room for evaluation of questionable seizure activity and chest pressure. The patient states he was making dinner and began to feel that he had an upset stomach. He went to the bathroom at which point he apparently had either a syncopal event or a seizure. His significant other heard a noise in the bathroom and found him shaking on the floor. EMS was called. They report that the patient seemed postictal on their arrival. Upon questioning he did complain of some chest pressure and lower back pain. Initial EKG was obtained and then repeated in the ambulance when he began to complain of some increasing anxiety symptoms. Arrival the patient is not having chest pain and this continued to complain of some lower back discomfort. He has no history of syncope or seizure. No fevers, chills, new medications. He drinks 1-2 days a week. Denies any illicit drug use. He does not recall any chest pain, palpitations, difficulty breathing prior to the event. No sudden severe headache. No radiation of symptoms.    Past Medical History:  Diagnosis Date  . Hypertension     Patient Active Problem List   Diagnosis Date Noted  . ICH (intracerebral hemorrhage) (HCC) 03/15/2016    History reviewed. No pertinent surgical history.  Current Outpatient Rx  . Order #: [id number] Class: Historical Med  . Order #: [id number] Class: Print  . Order #: [id number] Class: Print  . Order #: [id number] Class: Print  . Order #: [id number] Class: Print  . Order #: [id number] Class: Print  . Order #: [id number] Class: Print  . Order #: [id number] Class: Print    Allergies Patient has no known allergies.  No family history on file.  Social History Social History  Substance Use Topics  . Smoking status: Current Every Day  Smoker    Packs/day: 0.25    Types: Cigarettes  . Smokeless tobacco: Current User  . Alcohol use Yes     Comment: occasional    Review of Systems  Constitutional: No fever/chills. Positive LOC.  Eyes: No visual changes. ENT: No sore throat. Cardiovascular: Positive chest pain (now resolved). Respiratory: Denies shortness of breath. Gastrointestinal: No abdominal pain.  No nausea, no vomiting.  No diarrhea.  No constipation. Genitourinary: Negative for dysuria. Musculoskeletal: Positive lower for back pain. Skin: Negative for rash. Neurological: Negative for headaches, focal weakness or numbness. Positive questionable seizure.   10-point ROS otherwise negative.  ____________________________________________   PHYSICAL EXAM:  VITAL SIGNS: Pulse: 88 BP: 146/100 Resp: 17 SpO2: 94% RA   Constitutional: Alert and oriented. Well appearing and in no acute distress. Eyes: Conjunctivae are normal. PERRL.  Head: Atraumatic. Nose: No congestion/rhinnorhea. Mouth/Throat: Mucous membranes are moist.  Oropharynx non-erythematous. Neck: No stridor.   Cardiovascular: Normal rate, regular rhythm. Good peripheral circulation. Grossly normal heart sounds.   Respiratory: Normal respiratory effort.  No retractions. Lungs CTAB. Gastrointestinal: Soft and nontender. No distention.  Musculoskeletal: No lower extremity tenderness nor edema. No gross deformities of extremities. Neurologic:  Normal speech and language. No gross focal neurologic deficits are appreciated.  Skin:  Skin is warm, dry and intact. No rash noted.  ____________________________________________   LABS (all labs ordered are listed, but only abnormal results are displayed)  Labs Reviewed  COMPREHENSIVE METABOLIC PANEL - Abnormal; Notable for the following:  Result Value   Potassium 2.5 (*)    Chloride 100 (*)    Glucose, Bld 130 (*)    Creatinine, Ser 1.58 (*)    GFR calc non Af Amer 52 (*)    All other  components within normal limits  URINALYSIS, ROUTINE W REFLEX MICROSCOPIC - Abnormal; Notable for the following:    APPearance HAZY (*)    Hgb urine dipstick MODERATE (*)    Protein, ur 100 (*)    Bacteria, UA RARE (*)    All other components within normal limits  CBC WITH DIFFERENTIAL/PLATELET  RAPID URINE DRUG SCREEN, HOSP PERFORMED  ETHANOL  I-STAT TROPOININ, ED   ____________________________________________  EKG    EKG Interpretation  Date/Time:  Thursday March 15 2016 19:13:30 EDT Ventricular Rate:  86 PR Interval:    QRS Duration: 120 QT Interval:  381 QTC Calculation: 456 R Axis:   48 Text Interpretation:  Sinus rhythm Left atrial enlargement LVH with secondary repolarization abnormality Anterior ST elevation, probably due to LVH No STEMI.  Confirmed by LONG MD, JOSHUA [id number]) on 03/15/2016 7:17:09 PM       ____________________________________________  RADIOLOGY  Dg Chest 2 View  Result Date: 03/15/2016 CLINICAL DATA:  46 year old male with seizure like activity. EXAM: CHEST  2 VIEW COMPARISON:  Chest radiograph dated 07/10/2011 FINDINGS: The heart size and mediastinal contours are within normal limits. Both lungs are clear. The visualized skeletal structures are unremarkable. IMPRESSION: No active cardiopulmonary disease. Electronically Signed   By: Elgie Collard M.D.   On: 03/15/2016 19:37   Ct Head Wo Contrast  Result Date: 03/15/2016 CLINICAL DATA:  46 year old male with possible seizure. EXAM: CT HEAD WITHOUT CONTRAST TECHNIQUE: Contiguous axial images were obtained from the base of the skull through the vertex without intravenous contrast. COMPARISON:  None. FINDINGS: Brain: A 3 mm hyperdense focus within the posterior left parietal region may represent a small area of hemorrhage but there is no adjacent edema. Periventricular white matter hypodensities are nonspecific but may represent chronic small-vessel disease. No evidence of acute infarction, extra-axial  collection, hydrocephalus or midline shift. Vascular: No hyperdense vessel or unexpected calcification. Skull: Normal. Negative for fracture or focal lesion. Sinuses/Orbits: Clear except for partial opacification of the right sphenoid sinus Other: None IMPRESSION: 3 mm hyperdensity/hemorrhage within the posterior left parietal region -no adjacent edema. This may represent a small incidental cavernoma. MRI may be helpful for further evaluation as clinically indicated. Periventricular white matter hypodensities - nonspecific but question chronic small-vessel ischemic changes. Critical Value/emergent results were called by telephone at the time of interpretation on 03/15/2016 at 7:59 pm to Dr. Alona Bene , who verbally acknowledged these results. Electronically Signed   By: Harmon Pier M.D.   On: 03/15/2016 19:59   Mr Brain Wo Contrast  Result Date: 03/15/2016 CLINICAL DATA:  46 y/o M; seizure with questionable hemorrhage on CT. EXAM: MRI HEAD WITHOUT CONTRAST TECHNIQUE: Multiplanar, multiecho pulse sequences of the brain and surrounding structures were obtained without intravenous contrast. COMPARISON:  03/15/2016 CT of the head. FINDINGS: Brain: No diffusion signal abnormality. Numerous T2 FLAIR hyperintense foci within white matter are present in periventricular and subcortical white matter. No lesion is identified within the corpus callosum, brainstem, or cerebellum. There are T2 hyperintense foci present within the left lentiform nucleus and left caudate body with cystic change probably representing chronic lacunar infarcts. On SWI imaging there are numerous punctate foci of susceptibility hypointensity in greatest concentration within the bilateral basal ganglia and with additional  lesions in the left frontal lobe, left parietal lobe, bilateral temporal lobes, left occipital lobe, and several scattered foci in the cerebellar hemispheres. Susceptibility hypointensity likely represents hemosiderin deposition  from old microhemorrhage. The hyperdense lesion on CT as corresponding susceptibility blooming on MRI and is poorly visualized on T1 and T2 weighted sequences, likely a small acute hemorrhage. No hydrocephalus, extra-axial collection, or significant mass effect. Vascular: Intracranial arterial flow voids are diffusely enlarged. Skull and upper cervical spine: Normal marrow signal. Sinuses/Orbits: Right sphenoid sinus opacification, moderate left maxillary sinus mucosal thickening, and ethmoid sinus mucosal thickening. Other: 5 mm left paramedian frontal scalp lesion. IMPRESSION: 1. Hyperdense focus in left parietal lobe on CT demonstrates likely represents acute hemorrhage. 2. Numerous foci of chronic hemosiderin deposition with central predominance, diffuse enlargement of intracranial arterial flow voids, severe white matter disease for age, and sequelae of chronic lacunar infarcts in the basal ganglia. Findings are likely related to chronic severe hypertension, less likely vasculopathy such as CNS vasculitis or HIV associated. 3. Paranasal sinus disease predominantly in the right sphenoid and left maxillary sinuses. 4. 5 mm left paramedian frontal scalp lesion. Electronically Signed   By: Mitzi Hansen M.D.   On: 03/15/2016 22:01    ____________________________________________   PROCEDURES  Procedure(s) performed:   Procedures  CRITICAL CARE Performed by: Maia Plan Total critical care time: 40 minutes Critical care time was exclusive of separately billable procedures and treating other patients. Critical care was necessary to treat or prevent imminent or life-threatening deterioration. Critical care was time spent personally by me on the following activities: development of treatment plan with patient and/or surrogate as well as nursing, discussions with consultants, evaluation of patient's response to treatment, examination of patient, obtaining history from patient or surrogate,  ordering and performing treatments and interventions, ordering and review of laboratory studies, ordering and review of radiographic studies, pulse oximetry and re-evaluation of patient's condition.  Alona Bene, MD Emergency Medicine  ____________________________________________   INITIAL IMPRESSION / ASSESSMENT AND PLAN / ED COURSE  Pertinent labs & imaging results that were available during my care of the patient were reviewed by me and considered in my medical decision making (see chart for details).  Patient resents to the emergency department for evaluation of questionable syncope event versus seizure while sitting on the toilet today. Patient apparently appeared postictal to EMS on their arrival. No stigmata of seizure on exam. Patient's EKG was repeated in route with some anxiety and was nonspecific. Repeat EKG on arrival to the emergency department shows significant LVH but no acute ischemia. Patient not actively having any chest pain or other anginal equivalents. He is overall well-appearing with a nonfocal exam. Plan for CT scan of the head with questionable seizure activity along with lab work to evaluate for first-time seizure versus syncope with some associated chest discomfort. No STEMI on ED EKG or EKG transmitted from EMS.   08:00 PM Spoke with Radiology regarding CT findings. May be incidental but with seizure today will obtain MRI.   09:37 PM Spoke with Dr. Amada Jupiter who will see the patient with new onset seizure and concern for possible bleed. Will control BP and admit. Patient with worsening HA symptoms and BP which were not present initially. BP worsening.   Discussed patient's case with Neurology, Dr. Amada Jupiter. Patient and family (if present) updated with plan. Care transferred to Neurology service.  I reviewed all nursing notes, vitals, pertinent old records, EKGs, labs, imaging (as available).  ____________________________________________  FINAL CLINICAL  IMPRESSION(S) / ED DIAGNOSES  Final diagnoses:  Seizure (HCC)  Intracranial hemorrhage (HCC)  Essential hypertension  Precordial chest pain     MEDICATIONS GIVEN DURING THIS VISIT:  Medications  nicardipine (CARDENE) 20mg  in 0.86% saline IV infusion (0.1 mg/ml) (12.5 mg/hr Intravenous Rate/Dose Change 03/15/16 2254)  potassium chloride 20 MEQ/15ML (10%) solution 60 mEq (not administered)  sodium chloride 0.9 % bolus 1,000 mL (0 mLs Intravenous Stopped 03/15/16 2128)  sodium chloride 0.9 % bolus 1,000 mL (0 mLs Intravenous Stopped 03/15/16 2259)  potassium chloride SA (K-DUR,KLOR-CON) CR tablet 40 mEq (40 mEq Oral Given 03/15/16 2135)  morphine 4 MG/ML injection 4 mg (4 mg Intravenous Given 03/15/16 2210)  labetalol (NORMODYNE,TRANDATE) injection 10 mg (10 mg Intravenous Given 03/15/16 2211)     NEW OUTPATIENT MEDICATIONS STARTED DURING THIS VISIT:  None   Note:  This document was prepared using Dragon voice recognition software and may include unintentional dictation errors.  Alona Bene, MD Emergency Medicine   Maia Plan, MD 03/16/16 1026

## 2016-03-16 ENCOUNTER — Inpatient Hospital Stay (HOSPITAL_COMMUNITY): Payer: Self-pay

## 2016-03-16 DIAGNOSIS — E785 Hyperlipidemia, unspecified: Secondary | ICD-10-CM

## 2016-03-16 DIAGNOSIS — R569 Unspecified convulsions: Secondary | ICD-10-CM

## 2016-03-16 DIAGNOSIS — I161 Hypertensive emergency: Secondary | ICD-10-CM

## 2016-03-16 DIAGNOSIS — I6789 Other cerebrovascular disease: Secondary | ICD-10-CM

## 2016-03-16 DIAGNOSIS — E876 Hypokalemia: Secondary | ICD-10-CM

## 2016-03-16 DIAGNOSIS — F17200 Nicotine dependence, unspecified, uncomplicated: Secondary | ICD-10-CM

## 2016-03-16 LAB — LIPID PANEL
CHOL/HDL RATIO: 4.3 ratio
Cholesterol: 217 mg/dL — ABNORMAL HIGH (ref 0–200)
HDL: 50 mg/dL (ref >40)
LDL CALC: 151 mg/dL — AB (ref 0–99)
Triglycerides: 79 mg/dL (ref <150)
VLDL: 16 mg/dL (ref 0–40)

## 2016-03-16 LAB — VAS US CAROTID
LCCADDIAS: -19 cm/s
LCCADSYS: -79 cm/s
LCCAPSYS: 91 cm/s
LEFT ECA DIAS: -8 cm/s
LEFT VERTEBRAL DIAS: 9 cm/s
LICADSYS: -76 cm/s
Left CCA prox dias: 11 cm/s
Left ICA dist dias: -24 cm/s
Left ICA prox dias: -13 cm/s
Left ICA prox sys: -67 cm/s
RCCADSYS: -70 cm/s
RCCAPSYS: 108 cm/s
RIGHT ECA DIAS: -9 cm/s
RIGHT VERTEBRAL DIAS: 11 cm/s
Right CCA prox dias: 12 cm/s

## 2016-03-16 LAB — HIV ANTIBODY (ROUTINE TESTING W REFLEX): HIV SCREEN 4TH GENERATION: NONREACTIVE

## 2016-03-16 LAB — BASIC METABOLIC PANEL
ANION GAP: 11 (ref 5–15)
BUN: 7 mg/dL (ref 6–20)
CALCIUM: 8.7 mg/dL — AB (ref 8.9–10.3)
CO2: 29 mmol/L (ref 22–32)
Chloride: 102 mmol/L (ref 101–111)
Creatinine, Ser: 1.12 mg/dL (ref 0.61–1.24)
Glucose, Bld: 108 mg/dL — ABNORMAL HIGH (ref 65–99)
Potassium: 2.8 mmol/L — ABNORMAL LOW (ref 3.5–5.1)
Sodium: 142 mmol/L (ref 135–145)

## 2016-03-16 LAB — RPR: RPR: NONREACTIVE

## 2016-03-16 LAB — CBC
HEMATOCRIT: 42 % (ref 39.0–52.0)
Hemoglobin: 14.3 g/dL (ref 13.0–17.0)
MCH: 27.9 pg (ref 26.0–34.0)
MCHC: 34 g/dL (ref 30.0–36.0)
MCV: 82 fL (ref 78.0–100.0)
PLATELETS: 211 10*3/uL (ref 150–400)
RBC: 5.12 MIL/uL (ref 4.22–5.81)
RDW: 13.5 % (ref 11.5–15.5)
WBC: 7.4 10*3/uL (ref 4.0–10.5)

## 2016-03-16 LAB — ECHOCARDIOGRAM COMPLETE
Height: 78 in
Weight: 3432.12 oz

## 2016-03-16 LAB — VITAMIN B12: VITAMIN B 12: 598 pg/mL (ref 180–914)

## 2016-03-16 LAB — TSH: TSH: 0.568 u[IU]/mL (ref 0.350–4.500)

## 2016-03-16 LAB — MRSA PCR SCREENING: MRSA BY PCR: NEGATIVE

## 2016-03-16 MED ORDER — MAGIC MOUTHWASH
15.0000 mL | Freq: Once | ORAL | Status: DC
  Filled 2016-03-16: qty 15

## 2016-03-16 MED ORDER — PANTOPRAZOLE SODIUM 40 MG PO TBEC
40.0000 mg | DELAYED_RELEASE_TABLET | Freq: Every day | ORAL | Status: DC
  Administered 2016-03-16 – 2016-03-20 (×5): 40 mg via ORAL
  Filled 2016-03-16 (×5): qty 1

## 2016-03-16 MED ORDER — BENZOCAINE 10 % MT GEL
Freq: Three times a day (TID) | OROMUCOSAL | Status: DC | PRN
Start: 2016-03-16 — End: 2016-03-20
  Administered 2016-03-16: 1 via OROMUCOSAL
  Filled 2016-03-16: qty 9.4

## 2016-03-16 MED ORDER — ACETAMINOPHEN 325 MG PO TABS
650.0000 mg | ORAL_TABLET | ORAL | Status: DC | PRN
  Administered 2016-03-16 – 2016-03-19 (×5): 650 mg via ORAL
  Filled 2016-03-16 (×5): qty 2

## 2016-03-16 MED ORDER — PANTOPRAZOLE SODIUM 40 MG IV SOLR
40.0000 mg | Freq: Every day | INTRAVENOUS | Status: DC
  Administered 2016-03-16: 40 mg via INTRAVENOUS
  Filled 2016-03-16: qty 40

## 2016-03-16 MED ORDER — ATORVASTATIN CALCIUM 10 MG PO TABS
20.0000 mg | ORAL_TABLET | Freq: Every day | ORAL | Status: DC
  Administered 2016-03-16 – 2016-03-19 (×4): 20 mg via ORAL
  Filled 2016-03-16 (×3): qty 2
  Filled 2016-03-16: qty 1

## 2016-03-16 MED ORDER — AMLODIPINE BESYLATE 10 MG PO TABS
10.0000 mg | ORAL_TABLET | Freq: Every day | ORAL | Status: DC
  Administered 2016-03-16 – 2016-03-20 (×5): 10 mg via ORAL
  Filled 2016-03-16 (×5): qty 1

## 2016-03-16 MED ORDER — POTASSIUM CHLORIDE CRYS ER 20 MEQ PO TBCR
40.0000 meq | EXTENDED_RELEASE_TABLET | ORAL | Status: AC
  Administered 2016-03-16 – 2016-03-17 (×3): 40 meq via ORAL
  Filled 2016-03-16 (×3): qty 2

## 2016-03-16 MED ORDER — ACETAMINOPHEN 650 MG RE SUPP: 650.0000 mg | RECTAL | Status: DC | PRN

## 2016-03-16 MED ORDER — LABETALOL HCL 5 MG/ML IV SOLN: 10.0000 mg | INTRAVENOUS | Status: DC | PRN

## 2016-03-16 MED ORDER — SENNOSIDES-DOCUSATE SODIUM 8.6-50 MG PO TABS
1.0000 | ORAL_TABLET | Freq: Two times a day (BID) | ORAL | Status: DC
  Administered 2016-03-16 – 2016-03-20 (×10): 1 via ORAL
  Filled 2016-03-16 (×10): qty 1

## 2016-03-16 MED ORDER — STROKE: EARLY STAGES OF RECOVERY BOOK
Freq: Once | Status: AC
  Administered 2016-03-16: 02:00:00
  Filled 2016-03-16: qty 1

## 2016-03-16 MED ORDER — LEVETIRACETAM 500 MG PO TABS
500.0000 mg | ORAL_TABLET | Freq: Two times a day (BID) | ORAL | Status: DC
  Administered 2016-03-16 – 2016-03-20 (×9): 500 mg via ORAL
  Filled 2016-03-16 (×9): qty 1

## 2016-03-16 MED ORDER — ACETAMINOPHEN 160 MG/5ML PO SOLN: 650.0000 mg | ORAL | Status: DC | PRN

## 2016-03-16 MED ORDER — LABETALOL HCL 100 MG PO TABS
100.0000 mg | ORAL_TABLET | Freq: Three times a day (TID) | ORAL | Status: DC
  Administered 2016-03-16 – 2016-03-17 (×4): 100 mg via ORAL
  Filled 2016-03-16 (×4): qty 1

## 2016-03-16 NOTE — Progress Notes (Signed)
STROKE TEAM PROGRESS NOTE   HISTORY OF PRESENT ILLNESS (per record) Steven Higgins is a 46 y.o. male who was in his normal state of health until earlier this evening when he had a seizure. Family member describes that he was extended and had his eyes turned though she is not certain which way. He was shaking and bit his tongue.  He has since returned to baseline.  On evaluation for this in the emergency department a CT was performed which shows a small hemorrhage in the left parietal region.  This was unclear on initial CT and therefore an MRI was performed which does demonstrate a small acute hemorrhage.  He does not have any symptoms from this, and therefore I'm not certain we can say when it happened but likely I would suspect that the seizure happened at onset.  Of note, he has long-standing hypertension but has not taken his medicines and 3 years because the one that he was prescribed was $100 per month.  LKW: Unclear tpa given?: no, ICH ICH Score: 0   SUBJECTIVE (INTERVAL HISTORY) No family is at the bedside.  2D echo tech is doing TTE. He is back to baseline. He has no hx of seizure and he admitted that he did not compliant with medication for his HTN treatment.    OBJECTIVE Temp:  [98.3 F (36.8 C)-98.6 F (37 C)] 98.3 F (36.8 C) (03/16 0400) Pulse Rate:  [72-88] 72 (03/16 0645) Cardiac Rhythm: Normal sinus rhythm (03/16 0200) Resp:  [11-25] 12 (03/16 0645) BP: (131-196)/(79-127) 138/84 (03/16 0645) SpO2:  [90 %-96 %] 92 % (03/16 0645) FiO2 (%):  [0 %] 0 % (03/16 0139) Weight:  [97.3 kg (214 lb 8.1 oz)-97.5 kg (215 lb)] 97.3 kg (214 lb 8.1 oz) (03/16 0232)  CBC:   Recent Labs Lab 03/15/16 1904 03/16/16 0705  WBC 8.4 7.4  NEUTROABS 6.4  --   HGB 14.9 14.3  HCT 44.1 42.0  MCV 81.5 82.0  PLT 188 211    Basic Metabolic Panel:   Recent Labs Lab 03/15/16 1904 03/16/16 0705  NA 137 142  K 2.5* 2.8*  CL 100* 102  CO2 27 29  GLUCOSE 130* 108*  BUN 14  7  CREATININE 1.58* 1.12  CALCIUM 9.0 8.7*    Lipid Panel:     Component Value Date/Time   CHOL 217 (H) 03/16/2016 0705   TRIG 79 03/16/2016 0705   HDL 50 03/16/2016 0705   CHOLHDL 4.3 03/16/2016 0705   VLDL 16 03/16/2016 0705   LDLCALC 151 (H) 03/16/2016 0705   HgbA1c: No results found for: HGBA1C Urine Drug Screen:     Component Value Date/Time   LABOPIA NONE DETECTED 03/15/2016 2049   COCAINSCRNUR NONE DETECTED 03/15/2016 2049   LABBENZ NONE DETECTED 03/15/2016 2049   AMPHETMU NONE DETECTED 03/15/2016 2049   THCU NONE DETECTED 03/15/2016 2049   LABBARB NONE DETECTED 03/15/2016 2049      IMAGING I have personally reviewed the radiological images below and agree with the radiology interpretations.  Dg Chest 2 View 03/15/2016 No active cardiopulmonary disease.   Ct Head Wo Contrast 03/15/2016 3 mm hyperdensity/hemorrhage within the posterior left parietal region -no adjacent edema. This may represent a small incidental cavernoma. MRI may be helpful for further evaluation as clinically indicated. Periventricular white matter hypodensities - nonspecific but question chronic small-vessel ischemic changes.  Mr Brain Wo Contrast 03/15/2016 1. Hyperdense focus in left parietal lobe on CT demonstrates likely represents acute hemorrhage.  2. Numerous  foci of chronic hemosiderin deposition with central predominance, diffuse enlargement of intracranial arterial flow voids, severe white matter disease for age, and sequelae of chronic lacunar infarcts in the basal ganglia. Findings are likely related to chronic severe hypertension, less likely vasculopathy such as CNS vasculitis or HIV associated.  3. Paranasal sinus disease predominantly in the right sphenoid and left maxillary sinuses.  4. 5 mm left paramedian frontal scalp lesion.   Carotid Duplex    Findings suggest 1-39% internal carotid artery stenosis bilaterally. Vertebral arteries are patent with antegrade flow.  Renal  artery duplex  Findings suggest 1-59% renal artery stenosis bilaterally.  MRA pending  EEG pending  TTE pending   PHYSICAL EXAM  Temp:  [98.2 F (36.8 C)-98.6 F (37 C)] 98.3 F (36.8 C) (03/16 1200) Pulse Rate:  [72-88] 76 (03/16 1500) Resp:  [11-25] 15 (03/16 1500) BP: (131-196)/(79-127) 151/97 (03/16 1500) SpO2:  [90 %-97 %] 96 % (03/16 1500) FiO2 (%):  [0 %] 0 % (03/16 0139) Weight:  [214 lb 8.1 oz (97.3 kg)-215 lb (97.5 kg)] 214 lb 8.1 oz (97.3 kg) (03/16 0232)  General - Well nourished, well developed, in no apparent distress.  Ophthalmologic - Fundi not visualized due to at TTE testing.  Cardiovascular - Regular rate and rhythm.  Mental Status -  Level of arousal and orientation to time, place, and person were intact. Language including expression, naming, repetition, comprehension was assessed and found intact. Attention span and concentration were normal. Fund of Knowledge was assessed and was intact.  Cranial Nerves II - XII - II - Visual field intact OU. III, IV, VI - Extraocular movements intact. V - Facial sensation intact bilaterally. VII - Facial movement intact bilaterally. VIII - Hearing & vestibular intact bilaterally. X - Palate elevates symmetrically. XI - Chin turning & shoulder shrug intact bilaterally XII - Tongue protrusion intact.  Motor Strength - The patient's strength was normal in all extremities and pronator drift was absent.  Bulk was normal and fasciculations were absent.   Motor Tone - Muscle tone was assessed at the neck and appendages and was normal.  Reflexes - The patient's reflexes were 1+ in all extremities and he had no pathological reflexes.  Sensory - Light touch, temperature/pinprick were assessed and were symmetrical.    Coordination - The patient had normal movements in the hands and feet with no ataxia or dysmetria.  Tremor was absent.  Gait and Station - deferred due to during TTE test.   ASSESSMENT/PLAN Mr.  Masson Nalepa is a 46 y.o. male with history of hypertension, hypokalemia, previous infarcts by imaging, and ongoing tobacco use presenting with new onset seizure. He did not receive IV t-PA due to acute left parietal hemorrhage.  Seizure -  likely due to hypertensive emergency, less likely due to the tiny left parietal ICH  now onset  On keppra 500mg  bid  EEG pending  ICH - tiny left parietal ICH, likely secondary to untreated hypertension due to noncompliance. Also multiple CMBs on MRI support long standing untreated HTN.  Resultant no neuro deficit  MRI - left parietal lobe tiny acute hemorrhage. Chronic lacunar infarcts in the basal ganglia. Multiple CMBs throughout.  MRA - pending  Carotid Doppler - negative  2D Echo - pending  LDL - 151  HgbAc - pending  VTE prophylaxis - SCDs Diet regular Room service appropriate? Yes; Fluid consistency: Thin  aspirin 81 mg daily prior to admission, now on No antithrombotic - secondary to hemorrhage.  Ongoing aggressive  stroke risk factor management  Therapy recommendations: pending  Disposition: Pending  Hypertensive emergency  Stable now - off Cardene drip  On amlodipine and labetalol  Long-term BP goal normotensive  Medication compliance counseling       Renal artery duplex - negative for RAS  Metanephrines, Catacholamines, Aldosterone and Renin - pending for evaluation of secondary HTN  Hyperlipidemia  Home meds: No lipid lowering medications prior to admission  LDL 151, goal < 70  Add Lipitor 20 mg daily  Continue statin at discharge  Tobacco abuse  Current smoker  Smoking cessation counseling provided  Pt is willing to quit  Hypokalemia   K 2.6-2.5-2.8  Supplement  Check magnesium in am  Other Stroke Risk Factors  ETOH use, advised to drink no more than 1 drink per day  Hx stroke on imaging  Other Active Problems  Prednisone therapy prior to admission - ?    Hospital day # 1  This  patient is critically ill due to seizure, hypertensive emergency, and left ICH and at significant risk of neurological worsening, death form recurrent ICH, status epilepticus. This patient's care requires constant monitoring of vital signs, hemodynamics, respiratory and cardiac monitoring, review of multiple databases, neurological assessment, discussion with family, other specialists and medical decision making of high complexity. I spent 35 minutes of neurocritical care time in the care of this patient.  Marvel Plan, MD PhD Stroke Neurology 03/16/2016 4:04 PM  To contact Stroke Continuity provider, please refer to [email or web address]. After hours, contact General Neurology

## 2016-03-16 NOTE — Progress Notes (Signed)
*  PRELIMINARY RESULTS* Vascular Ultrasound Carotid Duplex (Doppler) has been completed.   Findings suggest 1-39% internal carotid artery stenosis bilaterally. Vertebral arteries are patent with antegrade flow.  Renal artery duplex has been completed. Findings suggest 1-59% renal artery stenosis bilaterally.  03/16/2016 10:29 AM Gertie Fey, BS, RVT, RDCS, RDMS

## 2016-03-16 NOTE — Progress Notes (Signed)
Patient a transfer from 17 MW alert and oriented x 4 ,speech care answers question appropriately and moves all extremities. Pt oriented to room and the use of call light to call at all time safety and fall education also given. The patient denied any c/o at present.  Cardiac monitor box          No 60M- 04 in use. use. CMT made aware verification complete. RN will continue to monitor pt. will

## 2016-03-16 NOTE — Progress Notes (Signed)
SLP Cancellation Note  Patient Details Name: Steven Higgins MRN: [id number] DOB: Aug 07, 1970   Cancelled treatment:       Reason Eval/Treat Not Completed: Other (comment) Attempted to see pt but has was having testing done in the room. Will f/u for speech/language eval as able.   Maxcine Ham 03/16/2016, 4:00 PM  Maxcine Ham, M.A. CCC-SLP [phone]

## 2016-03-16 NOTE — Progress Notes (Signed)
PT Cancellation Note  Patient Details Name: Steven Higgins MRN: [id number] DOB: November 02, 1970   Cancelled Treatment:    Reason Eval/Treat Not Completed: Patient not medically ready.  Patient remains on bedrest per orders.  MD:  Please write activity orders when appropriate for patient.  Thank you.   Vena Austria 03/16/2016, 10:40 AM Durenda Hurt. Renaldo Fiddler, [facility]hab Services Pager [phone]

## 2016-03-16 NOTE — Progress Notes (Signed)
  Echocardiogram 2D Echocardiogram has been performed.  Steven Higgins 03/16/2016, 4:08 PM

## 2016-03-16 NOTE — Progress Notes (Signed)
Bedside EEG completed, results pending. 

## 2016-03-16 NOTE — Care Management Note (Signed)
Case Management Note  Patient Details  Name: Steven Higgins MRN: [id number] Date of Birth: [birth year]  Subjective/Objective:    Pt admitted on 03/15/16 with small hemorrhage in the Lt parietal region resulting in a seizure.  PTA, pt independent of ADLS.                  Action/Plan: Will follow for discharge planning as pt progresses.    Expected Discharge Date:                  Expected Discharge Plan:  Home/Self Care  In-House Referral:     Discharge planning Services  CM Consult  Post Acute Care Choice:    Choice offered to:     DME Arranged:    DME Agency:     HH Arranged:    HH Agency:     Status of Service:  In process, will continue to follow  If discussed at Long Length of Stay Meetings, dates discussed:    Additional Comments:  Glennon Mac, RN 03/16/2016, 3:48 PM

## 2016-03-17 ENCOUNTER — Inpatient Hospital Stay (HOSPITAL_COMMUNITY): Payer: Self-pay

## 2016-03-17 LAB — BASIC METABOLIC PANEL
Anion gap: 10 (ref 5–15)
BUN: 6 mg/dL (ref 6–20)
CALCIUM: 8.9 mg/dL (ref 8.9–10.3)
CO2: 28 mmol/L (ref 22–32)
CREATININE: 1.18 mg/dL (ref 0.61–1.24)
Chloride: 101 mmol/L (ref 101–111)
GFR calc Af Amer: >60 mL/min (ref >60)
GLUCOSE: 115 mg/dL — AB (ref 65–99)
POTASSIUM: 2.9 mmol/L — AB (ref 3.5–5.1)
SODIUM: 139 mmol/L (ref 135–145)

## 2016-03-17 LAB — CBC
HCT: 42.9 % (ref 39.0–52.0)
Hemoglobin: 14.1 g/dL (ref 13.0–17.0)
MCH: 27.4 pg (ref 26.0–34.0)
MCHC: 32.9 g/dL (ref 30.0–36.0)
MCV: 83.5 fL (ref 78.0–100.0)
PLATELETS: 189 10*3/uL (ref 150–400)
RBC: 5.14 MIL/uL (ref 4.22–5.81)
RDW: 13.7 % (ref 11.5–15.5)
WBC: 8 10*3/uL (ref 4.0–10.5)

## 2016-03-17 LAB — HEMOGLOBIN A1C
Hgb A1c MFr Bld: 6.1 % — ABNORMAL HIGH (ref 4.8–5.6)
MEAN PLASMA GLUCOSE: 128 mg/dL

## 2016-03-17 LAB — MAGNESIUM: MAGNESIUM: 1.7 mg/dL (ref 1.7–2.4)

## 2016-03-17 MED ORDER — LABETALOL HCL 5 MG/ML IV SOLN
10.0000 mg | INTRAVENOUS | Status: DC | PRN
  Administered 2016-03-17 – 2016-03-19 (×3): 10 mg via INTRAVENOUS
  Filled 2016-03-17 (×4): qty 4

## 2016-03-17 MED ORDER — POTASSIUM CHLORIDE 20 MEQ PO PACK
20.0000 meq | PACK | Freq: Three times a day (TID) | ORAL | Status: DC
  Administered 2016-03-17 – 2016-03-18 (×5): 20 meq via ORAL
  Filled 2016-03-17 (×6): qty 1

## 2016-03-17 MED ORDER — MAGNESIUM CHLORIDE 64 MG PO TBEC
1.0000 | DELAYED_RELEASE_TABLET | Freq: Two times a day (BID) | ORAL | Status: AC
  Administered 2016-03-17 – 2016-03-19 (×6): 64 mg via ORAL
  Filled 2016-03-17 (×6): qty 1

## 2016-03-17 MED ORDER — LABETALOL HCL 200 MG PO TABS
200.0000 mg | ORAL_TABLET | Freq: Three times a day (TID) | ORAL | Status: DC
  Administered 2016-03-17 – 2016-03-20 (×9): 200 mg via ORAL
  Filled 2016-03-17 (×9): qty 1

## 2016-03-17 NOTE — Progress Notes (Signed)
Elevated BP; prn labetalol given; continue to monitor;goal SBP<160.

## 2016-03-17 NOTE — Progress Notes (Addendum)
STROKE TEAM PROGRESS NOTE   HISTORY OF PRESENT ILLNESS (per record) Keyandre Pileggi is a 46 y.o. male who was in his normal state of health until earlier this evening when he had a seizure. Family member describes that he was extended and had his eyes turned though she is not certain which way. He was shaking and bit his tongue.  He has since returned to baseline.  On evaluation for this in the emergency department a CT was performed which shows a small hemorrhage in the left parietal region.  This was unclear on initial CT and therefore an MRI was performed which does demonstrate a small acute hemorrhage.  He does not have any symptoms from this, and therefore I'm not certain we can say when it happened but likely I would suspect that the seizure happened at onset.  Of note, he has long-standing hypertension but has not taken his medicines and 3 years because the one that he was prescribed was $100 per month.  LKW: Unclear tpa given?: no, ICH ICH Score: 0   SUBJECTIVE (INTERVAL HISTORY) Family is at the bedside.  He is back to baseline. But he reports feeling very dizzy.  He has no hx of seizure and he admitted that he did not compliant with medication for his HTN treatment due to having no insurance.  I agreed to order Social Work consult   OBJECTIVE Temp:  [98.7 F (37.1 C)-101.1 F (38.4 C)] 98.9 F (37.2 C) (03/17 1034) Pulse Rate:  [71-87] 76 (03/17 1142) Cardiac Rhythm: Normal sinus rhythm;Bundle branch block (03/17 0700) Resp:  [11-22] 18 (03/17 1034) BP: (133-173)/(79-109) 159/109 (03/17 1142) SpO2:  [89 %-98 %] 98 % (03/17 1142) Weight:  [96.3 kg (212 lb 6.4 oz)] 96.3 kg (212 lb 6.4 oz) (03/16 2130)  CBC:   Recent Labs Lab 03/15/16 1904 03/16/16 0705 03/17/16 0455  WBC 8.4 7.4 8.0  NEUTROABS 6.4  --   --   HGB 14.9 14.3 14.1  HCT 44.1 42.0 42.9  MCV 81.5 82.0 83.5  PLT 188 211 189    Basic Metabolic Panel:   Recent Labs Lab 03/16/16 0705  03/17/16 0455  NA 142 139  K 2.8* 2.9*  CL 102 101  CO2 29 28  GLUCOSE 108* 115*  BUN 7 6  CREATININE 1.12 1.18  CALCIUM 8.7* 8.9  MG  --  1.7    Lipid Panel:     Component Value Date/Time   CHOL 217 (H) 03/16/2016 0705   TRIG 79 03/16/2016 0705   HDL 50 03/16/2016 0705   CHOLHDL 4.3 03/16/2016 0705   VLDL 16 03/16/2016 0705   LDLCALC 151 (H) 03/16/2016 0705   HgbA1c:  Lab Results  Component Value Date   HGBA1C 6.1 (H) 03/16/2016   Urine Drug Screen:     Component Value Date/Time   LABOPIA NONE DETECTED 03/15/2016 2049   COCAINSCRNUR NONE DETECTED 03/15/2016 2049   LABBENZ NONE DETECTED 03/15/2016 2049   AMPHETMU NONE DETECTED 03/15/2016 2049   THCU NONE DETECTED 03/15/2016 2049   LABBARB NONE DETECTED 03/15/2016 2049      IMAGING I have personally reviewed the radiological images below and agree with the radiology interpretations.  Dg Chest 2 View 03/15/2016 No active cardiopulmonary disease.   Ct Head Wo Contrast 03/15/2016 3 mm hyperdensity/hemorrhage within the posterior left parietal region -no adjacent edema. This may represent a small incidental cavernoma. MRI may be helpful for further evaluation as clinically indicated. Periventricular white matter hypodensities - nonspecific but  question chronic small-vessel ischemic changes.  Mr Brain Wo Contrast 03/15/2016 1. Hyperdense focus in left parietal lobe on CT demonstrates likely represents acute hemorrhage.  2. Numerous foci of chronic hemosiderin deposition with central predominance, diffuse enlargement of intracranial arterial flow voids, severe white matter disease for age, and sequelae of chronic lacunar infarcts in the basal ganglia. Findings are likely related to chronic severe hypertension, less likely vasculopathy such as CNS vasculitis or HIV associated.  3. Paranasal sinus disease predominantly in the right sphenoid and left maxillary sinuses.  4. 5 mm left paramedian frontal scalp lesion.    Carotid Duplex    Findings suggest 1-39% internal carotid artery stenosis bilaterally. Vertebral arteries are patent with antegrade flow.  Renal artery duplex  Findings suggest 1-59% renal artery stenosis bilaterally.  MRA Head Wo Contrast 01/17/2016 1. Mild bilateral M1 ectasia and bilateral M2 superior division short segments of mild stenosis. Combined with findings of prior MRI of the brain, this is likely to represent artery remodeling secondary to chronic hypertension, less likely vasculitis. 2. No high-grade stenosis, large vessel occlusion, or aneurysm identified.    EEG pending    TTE  03/16/2016 Study Conclusions - Left ventricle: The cavity size was at the upper limits of   normal. Wall thickness was increased in a pattern of mild LVH.   Systolic function was mildly to moderately reduced. The estimated   ejection fraction was in the range of 40% to 45%. Diffuse hypokinesis. - Aortic valve: There was no stenosis. There was mild   regurgitation. - Mitral valve: There was trivial regurgitation. - Left atrium: The atrium was moderately dilated. - Right ventricle: The cavity size was normal. Systolic function   was normal. - Right atrium: The atrium was moderately dilated. - Pulmonary arteries: No complete TR doppler jet so unable to   estimate PA systolic pressure. - Systemic veins: IVC measured 2.7 cm with > 50% respirophasic   variation, suggesting RA pressure 8 mmHg. Impressions: - Borderline dilated LV with mild LV hypertrophy. EF 40-45%, diffuse hypokinesis.  Normal RV size and systolic function. Mild aortic insufficiency.     PHYSICAL EXAM  Temp:  [98.7 F (37.1 C)-101.1 F (38.4 C)] 98.9 F (37.2 C) (03/17 1034) Pulse Rate:  [71-87] 76 (03/17 1142) Resp:  [11-22] 18 (03/17 1034) BP: (133-173)/(79-109) 159/109 (03/17 1142) SpO2:  [89 %-98 %] 98 % (03/17 1142) Weight:  [96.3 kg (212 lb 6.4 oz)] 96.3 kg (212 lb 6.4 oz) (03/16 2130)  General - Well  nourished, well developed, in no apparent distress.  Ophthalmologic - Fundi not visualized due to at TTE testing.  Cardiovascular - Regular rate and rhythm.  Mental Status -  Level of arousal and orientation to time, place, and person were intact. Language including expression, naming, repetition, comprehension was assessed and found intact. Attention span and concentration were normal. Fund of Knowledge was assessed and was intact.  Cranial Nerves II - XII - II - Visual field intact OU. III, IV, VI - Extraocular movements intact. V - Facial sensation intact bilaterally. VII - Facial movement intact bilaterally. VIII - Hearing & vestibular intact bilaterally. X - Palate elevates symmetrically. XI - Chin turning & shoulder shrug intact bilaterally XII - Tongue protrusion intact.  Motor Strength - The patient's strength was normal in all extremities and pronator drift was absent.  Bulk was normal and fasciculations were absent.   Motor Tone - Muscle tone was assessed at the neck and appendages and was normal.  Reflexes -  The patient's reflexes were 1+ in all extremities and he had no pathological reflexes.  Sensory - Light touch, temperature/pinprick were assessed and were symmetrical.    Coordination - The patient had normal movements in the hands and feet with no ataxia or dysmetria.  Tremor was absent.  Gait and Station - deferred due to during TTE test.   ASSESSMENT/PLAN Mr. Yostin Malacara is a 46 y.o. male with history of hypertension, hypokalemia, previous infarcts by imaging, and ongoing tobacco use presenting with new onset seizure. He did not receive IV t-PA due to acute left parietal hemorrhage.  Seizure -  likely due to hypertensive emergency, less likely due to the tiny left parietal ICH  now onset  On keppra 500mg  bid  EEG - pending  ICH - tiny left parietal ICH, likely secondary to untreated hypertension due to noncompliance. Also multiple CMBs on MRI support  long standing untreated HTN.  Resultant no neuro deficit  MRI - left parietal lobe tiny acute hemorrhage. Chronic lacunar infarcts in the basal ganglia. Multiple CMBs throughout.  MRA - No high-grade stenosis, large vessel occlusion, or aneurysm identified.  Carotid Doppler - negative  2D Echo - 40% to 45%. Diffuse hypokinesis. No cardiac source of emboli identified.  LDL - 151  HgbAc - 6.1  VTE prophylaxis - SCDs Diet regular Room service appropriate? Yes; Fluid consistency: Thin  aspirin 81 mg daily prior to admission, now on No antithrombotic - secondary to hemorrhage.  Ongoing aggressive stroke risk factor management  Therapy recommendations: pending - increase activity. BP running high.  Disposition: Pending  Hypertensive emergency  Stable now - off Cardene drip -> Labetalol increased.  On amlodipine and labetalol  Long-term BP goal normotensive  Medication compliance counseling       Renal artery duplex - negative for RAS  Metanephrines, Catacholamines, Aldosterone and Renin - pending for evaluation of secondary HTN  Hyperlipidemia  Home meds: No lipid lowering medications prior to admission  LDL 151, goal < 70  Add Lipitor 20 mg daily  Continue statin at discharge  Tobacco abuse  Current smoker  Smoking cessation counseling provided  Pt is willing to quit  Hypokalemia   K 2.6-2.5-2.8 -> 2.9 supplement further and recheck  Check magnesium in am -> 1.7 supplement  Other Stroke Risk Factors  ETOH use, advised to drink no more than 1 drink per day  Hx stroke on imaging  Other Active Problems  Prednisone therapy prior to admission - ?  Nurse reports patient C/O back and hip pain secondary to fall associated with seizure.  May need imaging.  Hospital day # 2  ATTENDING NOTE: Patient was seen and examined by me personally. Documentation reflects findings. The laboratory and radiographic studies reviewed by me. ROS completed by me  personally and pertinent positives fully documented  Condition:  Stable  Assessment and plan completed by me personally and fully documented above. Plans/Recommendations include:     As abovce  Will also check orthostatics  Case management consult for medication assistance   SIGNED BY: Dr. Sula Soda     To contact Stroke Continuity provider, please refer to [email or web address]. After hours, contact General Neurology

## 2016-03-17 NOTE — Progress Notes (Signed)
OT Cancellation Note  Patient Details Name: Steven Higgins MRN: [id number] DOB: [birth year]   Cancelled Treatment:    Reason Eval/Treat Not Completed: Other (comment). Per conversation with PT, pt just finished with PT and blood pressure is measuring high even after rest. Will reattempt.   Pearletha Furl OTR/L Pager: [phone]  03/17/2016, 12:22 PM

## 2016-03-17 NOTE — Procedures (Signed)
History: 46 yo M with new onset seziure, tiny ICH  Sedation: None  Technique: This is a 21 channel routine scalp EEG performed at the bedside with bipolar and monopolar montages arranged in accordance to the international 10/20 system of electrode placement. One channel was dedicated to EKG recording.    Background: The background consists of intermixed alpha and beta activities. There is a well defined posterior dominant rhythm of 9 - 10 Hz that attenuates with eye opening.   Photic stimulation: Physiologic driving is not performed  EEG Abnormalities: None  Clinical Interpretation: This normal EEG is recorded in the waking and drowsy state. There was no seizure or seizure predisposition recorded on this study. Please note that a normal EEG does not preclude the possibility of epilepsy.   Ritta Slot, MD [facility] [phone]  If 7pm- 7am, please page neurology on call as listed in AMION.

## 2016-03-17 NOTE — Progress Notes (Addendum)
Patient having nose bleed; red blood on tissues; patient denies any drainage down the throat; patient held pressure and tissue up his nares; no active bleeding at this time; continue to monitor.

## 2016-03-17 NOTE — Progress Notes (Signed)
Physical Therapy Evaluation Patient Details Name: Steven Higgins MRN: [id number] DOB: [birth year] Today's Date: 03/17/2016   History of Present Illness  46 yo male with episode of seizure on 03/15/16. Imaging shows small ICH, and patient has history of HTN.  Clinical Impression  Patient seen with sister present, she is visiting from [place], and will be able to stay with him on return home.  Physically, patient presents with full strength and coordination, had been on bed rest for 2 days, Supervision for safety on initial stand and activity.  BP checked following activity and found to be significantly elevated, so patient returned to bed and nursing notified.  Balance not formally assessed this treatment.  Patient also reports Left hip pain, which sister feels may be from fall during seizure event.  Also reported to nursing.     Follow Up Recommendations Supervision/Assistance - 24 hour;Supervision for mobility/OOB    Equipment Recommendations   (To be determined)    Recommendations for Other Services       Precautions / Restrictions Precautions Precautions: Fall;Other (comment) Precaution Comments: Monitor BP      Mobility  Bed Mobility Overal bed mobility: Independent                Transfers Overall transfer level: Modified independent Equipment used: None             General transfer comment: Patient very tall, Supervision due to first time standing in 2 days.  Ambulation/Gait Ambulation/Gait assistance: Supervision Ambulation Distance (Feet): 100 Feet Assistive device: None (Seeking handrail for stability.) Gait Pattern/deviations: Step-through pattern;Wide base of support   Gait velocity interpretation: Below normal speed for age/gender General Gait Details: Slower pace, wide base of support with slightly everted hips, seeking hallway rail for mild stability assist.  Stairs            Wheelchair Mobility    Modified Rankin (Stroke Patients  Only) Modified Rankin (Stroke Patients Only) Pre-Morbid Rankin Score: No symptoms Modified Rankin: Slight disability     Balance Overall balance assessment: Needs assistance   Sitting balance-Leahy Scale: Normal     Standing balance support: No upper extremity supported Standing balance-Leahy Scale: Fair                 High Level Balance Comments: Formal balance assessment not completed due to elevated BP             Pertinent Vitals/Pain Pain Assessment: 0-10 Pain Score: 7  Pain Location: Left hip (Sister states he may have fallen on it when had seizure. ) Pain Descriptors / Indicators: Sore;Aching Pain Intervention(s): Monitored during session    Home Living Family/patient expects to be discharged to:: Private residence Living Arrangements: Alone Available Help at Discharge: Family;Available 24 hours/day (Sister visiting now, will stay with patient on return home.) Type of Home: Apartment Home Access: Level entry     Home Layout: One level        Prior Function Level of Independence: Independent         Comments: Reports he is normally active, recreational athletics, etc.     Hand Dominance        Extremity/Trunk Assessment   Upper Extremity Assessment Upper Extremity Assessment: Defer to OT evaluation;Overall [facility] for tasks assessed    Lower Extremity Assessment Lower Extremity Assessment: Overall WFL for tasks assessed    Cervical / Trunk Assessment Cervical / Trunk Assessment: Normal  Communication   Communication: No difficulties  Cognition Arousal/Alertness: Awake/alert Behavior During Therapy:  WFL for tasks assessed/performed Overall Cognitive Status: Within Functional Limits for tasks assessed                      General Comments General comments (skin integrity, edema, etc.): BP in sitting, after gait 166/112 (126), terminated evaluation, patient returned to supine and reported to nursing.    Exercises      Assessment/Plan    PT Assessment Patient needs continued PT services  PT Problem List Decreased activity tolerance;Decreased balance;Decreased mobility;Decreased safety awareness;Decreased knowledge of precautions;Pain       PT Treatment Interventions Gait training;Functional mobility training;Balance training;Therapeutic activities;Therapeutic exercise;Neuromuscular re-education;Patient/family education    PT Goals (Current goals can be found in the Care Plan section)  Acute Rehab PT Goals Patient Stated Goal: Get back home PT Goal Formulation: With patient/family Time For Goal Achievement: 03/31/16 Potential to Achieve Goals: Good    Frequency Min 4X/week   Barriers to discharge        Co-evaluation               End of Session Equipment Utilized During Treatment: Gait belt Activity Tolerance: Treatment limited secondary to medical complications (Comment) Patient left: in bed;with call bell/phone within reach;with family/visitor present Nurse Communication: Mobility status;Patient requests pain meds;Other (comment) (Elevated BP, returned to supine) PT Visit Diagnosis: Other abnormalities of gait and mobility (R26.89);Pain Pain - Right/Left: Left Pain - part of body: Hip    Functional Assessment Tool Used: AM-PAC 6 Clicks Basic Mobility Functional Limitation: Mobility: Walking and moving around Mobility: Walking and Moving Around Current Status (Z6109): At least 20 percent but less than 40 percent impaired, limited or restricted Mobility: Walking and Moving Around Goal Status [phone]): 0 percent impaired, limited or restricted    Time: 1100-1125 PT Time Calculation (min) (ACUTE ONLY): 25 min   Charges:   PT Evaluation $PT Eval Moderate Complexity: 1 Procedure     PT G Codes:   PT G-Codes **NOT FOR INPATIENT CLASS** Functional Assessment Tool Used: AM-PAC 6 Clicks Basic Mobility Functional Limitation: Mobility: Walking and moving around Mobility: Walking  and Moving Around Current Status (U9811): At least 20 percent but less than 40 percent impaired, limited or restricted Mobility: Walking and Moving Around Goal Status [phone]): 0 percent impaired, limited or restricted     Freida Busman, Yulitza Shorts L 03/17/2016, 12:53 PM

## 2016-03-18 LAB — BASIC METABOLIC PANEL
Anion gap: 8 (ref 5–15)
BUN: 8 mg/dL (ref 6–20)
CALCIUM: 9 mg/dL (ref 8.9–10.3)
CHLORIDE: 100 mmol/L — AB (ref 101–111)
CO2: 29 mmol/L (ref 22–32)
CREATININE: 1.22 mg/dL (ref 0.61–1.24)
GFR calc Af Amer: >60 mL/min (ref >60)
GFR calc non Af Amer: >60 mL/min (ref >60)
GLUCOSE: 111 mg/dL — AB (ref 65–99)
Potassium: 3.1 mmol/L — ABNORMAL LOW (ref 3.5–5.1)
Sodium: 137 mmol/L (ref 135–145)

## 2016-03-18 LAB — CBC WITH DIFFERENTIAL/PLATELET
BASOS ABS: 0 10*3/uL (ref 0.0–0.1)
Basophils Relative: 0 %
Eosinophils Absolute: 0.3 10*3/uL (ref 0.0–0.7)
Eosinophils Relative: 5 %
HEMATOCRIT: 41.2 % (ref 39.0–52.0)
Hemoglobin: 13.6 g/dL (ref 13.0–17.0)
Lymphocytes Relative: 41 %
Lymphs Abs: 2.9 10*3/uL (ref 0.7–4.0)
MCH: 27.5 pg (ref 26.0–34.0)
MCHC: 33 g/dL (ref 30.0–36.0)
MCV: 83.4 fL (ref 78.0–100.0)
MONOS PCT: 9 %
Monocytes Absolute: 0.6 10*3/uL (ref 0.1–1.0)
NEUTROS ABS: 3.3 10*3/uL (ref 1.7–7.7)
Neutrophils Relative %: 45 %
Platelets: 186 10*3/uL (ref 150–400)
RBC: 4.94 MIL/uL (ref 4.22–5.81)
RDW: 13.8 % (ref 11.5–15.5)
WBC: 7.2 10*3/uL (ref 4.0–10.5)

## 2016-03-18 LAB — MAGNESIUM: Magnesium: 1.7 mg/dL (ref 1.7–2.4)

## 2016-03-18 NOTE — Progress Notes (Signed)
STROKE TEAM PROGRESS NOTE   HISTORY OF PRESENT ILLNESS (per record) Steven Higgins is a 46 y.o. male who was in his normal state of health until earlier this evening when he had a seizure. Family member describes that he was extended and had his eyes turned though she is not certain which way. He was shaking and bit his tongue.  He has since returned to baseline.  On evaluation for this in the emergency department a CT was performed which shows a small hemorrhage in the left parietal region.  This was unclear on initial CT and therefore an MRI was performed which does demonstrate a small acute hemorrhage.  He does not have any symptoms from this, and therefore I'm not certain we can say when it happened but likely I would suspect that the seizure happened at onset.  Of note, he has long-standing hypertension but has not taken his medicines and 3 years because the one that he was prescribed was $100 per month.  LKW: Unclear tpa given?: no, ICH ICH Score: 0   SUBJECTIVE (INTERVAL HISTORY) Family is at the bedside.  He is back to baseline. But he continue to report feeling very dizzy.  He feels generalized weakness today   OBJECTIVE Temp:  [97.8 F (36.6 C)-99.7 F (37.6 C)] 97.8 F (36.6 C) (03/18 1043) Pulse Rate:  [70-114] 79 (03/18 1043) Cardiac Rhythm: Heart block (03/18 0753) Resp:  [16-20] 16 (03/18 1043) BP: (157-181)/(102-111) 157/102 (03/18 1043) SpO2:  [94 %-99 %] 99 % (03/18 1043)  CBC:   Recent Labs Lab 03/15/16 1904  03/17/16 0455 03/18/16 0403  WBC 8.4  < > 8.0 7.2  NEUTROABS 6.4  --   --  3.3  HGB 14.9  < > 14.1 13.6  HCT 44.1  < > 42.9 41.2  MCV 81.5  < > 83.5 83.4  PLT 188  < > 189 186  < > = values in this interval not displayed.  Basic Metabolic Panel:   Recent Labs Lab 03/17/16 0455 03/18/16 0403  NA 139 137  K 2.9* 3.1*  CL 101 100*  CO2 28 29  GLUCOSE 115* 111*  BUN 6 8  CREATININE 1.18 1.22  CALCIUM 8.9 9.0  MG 1.7  --      Lipid Panel:     Component Value Date/Time   CHOL 217 (H) 03/16/2016 0705   TRIG 79 03/16/2016 0705   HDL 50 03/16/2016 0705   CHOLHDL 4.3 03/16/2016 0705   VLDL 16 03/16/2016 0705   LDLCALC 151 (H) 03/16/2016 0705   HgbA1c:  Lab Results  Component Value Date   HGBA1C 6.1 (H) 03/16/2016   Urine Drug Screen:     Component Value Date/Time   LABOPIA NONE DETECTED 03/15/2016 2049   COCAINSCRNUR NONE DETECTED 03/15/2016 2049   LABBENZ NONE DETECTED 03/15/2016 2049   AMPHETMU NONE DETECTED 03/15/2016 2049   THCU NONE DETECTED 03/15/2016 2049   LABBARB NONE DETECTED 03/15/2016 2049      IMAGING I have personally reviewed the radiological images below and agree with the radiology interpretations.  Dg Chest 2 View 03/15/2016 No active cardiopulmonary disease.   Ct Head Wo Contrast 03/15/2016 3 mm hyperdensity/hemorrhage within the posterior left parietal region -no adjacent edema. This may represent a small incidental cavernoma. MRI may be helpful for further evaluation as clinically indicated. Periventricular white matter hypodensities - nonspecific but question chronic small-vessel ischemic changes.  Mr Brain Wo Contrast 03/15/2016 1. Hyperdense focus in left parietal lobe on CT  demonstrates likely represents acute hemorrhage.  2. Numerous foci of chronic hemosiderin deposition with central predominance, diffuse enlargement of intracranial arterial flow voids, severe white matter disease for age, and sequelae of chronic lacunar infarcts in the basal ganglia. Findings are likely related to chronic severe hypertension, less likely vasculopathy such as CNS vasculitis or HIV associated.  3. Paranasal sinus disease predominantly in the right sphenoid and left maxillary sinuses.  4. 5 mm left paramedian frontal scalp lesion.   Carotid Duplex    Findings suggest 1-39% internal carotid artery stenosis bilaterally. Vertebral arteries are patent with antegrade flow.  Renal artery  duplex  Findings suggest 1-59% renal artery stenosis bilaterally.  MRA Head Wo Contrast 01/17/2016 1. Mild bilateral M1 ectasia and bilateral M2 superior division short segments of mild stenosis. Combined with findings of prior MRI of the brain, this is likely to represent artery remodeling secondary to chronic hypertension, less likely vasculitis. 2. No high-grade stenosis, large vessel occlusion, or aneurysm identified.   Lumbar Spine 4 View 03/17/2016 1. Loss of the normal lumbar lordosis may be related to muscle spasm. 2. Mild localized degenerative disc disease at L5-S1.  EEG  03/17/2014 EEG Abnormalities: None Clinical Interpretation: This normal EEG is recorded in the waking and drowsy state. There was no seizure or seizure predisposition recorded on this study. Please note that a normal EEG does not preclude the possibility of epilepsy.    TTE  03/16/2016 Study Conclusions - Left ventricle: The cavity size was at the upper limits of   normal. Wall thickness was increased in a pattern of mild LVH.   Systolic function was mildly to moderately reduced. The estimated   ejection fraction was in the range of 40% to 45%. Diffuse hypokinesis. - Aortic valve: There was no stenosis. There was mild   regurgitation. - Mitral valve: There was trivial regurgitation. - Left atrium: The atrium was moderately dilated. - Right ventricle: The cavity size was normal. Systolic function   was normal. - Right atrium: The atrium was moderately dilated. - Pulmonary arteries: No complete TR doppler jet so unable to   estimate PA systolic pressure. - Systemic veins: IVC measured 2.7 cm with > 50% respirophasic   variation, suggesting RA pressure 8 mmHg. Impressions: - Borderline dilated LV with mild LV hypertrophy. EF 40-45%, diffuse hypokinesis.  Normal RV size and systolic function. Mild aortic insufficiency.  PHYSICAL EXAM  Temp:  [97.8 F (36.6 C)-99.7 F (37.6 C)] 97.8 F (36.6 C)  (03/18 1043) Pulse Rate:  [70-114] 79 (03/18 1043) Resp:  [16-20] 16 (03/18 1043) BP: (157-181)/(102-111) 157/102 (03/18 1043) SpO2:  [94 %-99 %] 99 % (03/18 1043)  General - Well nourished, well developed, in no apparent distress.  Ophthalmologic - Fundi not visualized due to at TTE testing.  Cardiovascular - Regular rate and rhythm.  Mental Status -  Level of arousal and orientation to time, place, and person were intact. Language including expression, naming, repetition, comprehension was assessed and found intact. Attention span and concentration were normal. Fund of Knowledge was assessed and was intact.  Cranial Nerves II - XII - II - Visual field intact OU. III, IV, VI - Extraocular movements intact. V - Facial sensation intact bilaterally. VII - Facial movement intact bilaterally. VIII - Hearing & vestibular intact bilaterally. X - Palate elevates symmetrically. XI - Chin turning & shoulder shrug intact bilaterally XII - Tongue protrusion intact.  Motor Strength - The patient's strength was normal in all extremities and pronator drift was  absent.  Bulk was normal and fasciculations were absent.   Motor Tone - Muscle tone was assessed at the neck and appendages and was normal.  Reflexes - The patient's reflexes were 1+ in all extremities and he had no pathological reflexes.  Sensory - Light touch, temperature/pinprick were assessed and were symmetrical.    Coordination - The patient had normal movements in the hands and feet with no ataxia or dysmetria.  Tremor was absent.  Gait and Station - deferred due to during TTE test.   ASSESSMENT/PLAN [name] is a 46 y.o. male with history of hypertension, hypokalemia, previous infarcts by imaging, and ongoing tobacco use presenting with new onset seizure. He did not receive IV t-PA due to acute left parietal hemorrhage.  Seizure -  likely due to hypertensive emergency, less likely due to the tiny left parietal  ICH  now onset  On keppra 500mg  bid  EEG - normal  ICH - tiny left parietal ICH, likely secondary to untreated hypertension due to noncompliance. Also multiple CMBs on MRI support long standing untreated HTN.  Resultant no neuro deficit  MRI - left parietal lobe tiny acute hemorrhage. Chronic lacunar infarcts in the basal ganglia. Multiple CMBs throughout.  MRA - No high-grade stenosis, large vessel occlusion, or aneurysm identified.  Carotid Doppler - negative  2D Echo - 40% to 45%. Diffuse hypokinesis. No cardiac source of emboli identified.  LDL - 151  HgbAc - 6.1  VTE prophylaxis - SCDs Diet regular Room service appropriate? Yes; Fluid consistency: Thin  aspirin 81 mg daily prior to admission, now on No antithrombotic - secondary to hemorrhage.  Ongoing aggressive stroke risk factor management  Therapy recommendations: 24 hr per day supervision recommended.  Disposition: Pending  Hypertensive emergency  Stable now - off Cardene drip -> Labetalol increased.  On amlodipine and labetalol  Long-term BP goal normotensive  Medication compliance counseling       Renal artery duplex - negative for RAS  Metanephrines, Catacholamines, Aldosterone and Renin - pending for evaluation of secondary HTN  Hyperlipidemia  Home meds: No lipid lowering medications prior to admission  LDL 151, goal < 70  Now on Lipitor 20 mg daily  Continue statin at discharge  Tobacco abuse  Current smoker  Smoking cessation counseling provided  Pt is willing to quit  Hypokalemia   K 2.6-2.5-2.8 -> 2.9 -> 3.1 -> continue potassium -> check in AM  Check magnesium in am -> 1.7 supplement -> check in AM  Other Stroke Risk Factors  ETOH use, advised to drink no more than 1 drink per day  Hx stroke on imaging  Other Active Problems  Prednisone therapy prior to admission - ?  Nurse reports patient C/O back and hip pain secondary to fall associated with seizure.  Lumbar spine  films - as above.  Hospital day # 3  ATTENDING NOTE: Patient was seen and examined by me personally. Documentation reflects findings. The laboratory and radiographic studies reviewed by me. ROS completed by me personally and pertinent positives fully documented  Condition:  Stable  Assessment and plan completed by me personally and fully documented above. Plans/Recommendations include:     As above  If patient continue to have lower back discomfort, may need muscle relaxant given film findings.  However, today the back pain is not his focal issue  Hypokalemia remains and issue.  Metanephrines, Catacholamines, Aldosterone and Renin - pending for evaluation of secondary HTN  Checked orthostatics -> unremarkable  Case management  consult for medication assistance   To contact Stroke Continuity provider, please refer to [email or web address]. After hours, contact General Neurology

## 2016-03-18 NOTE — Progress Notes (Signed)
Family at bedside reports that patient having chest pain.  Patient denies chest pain and upset with wife for reporting the chest pain.  Tylenol administered, and encouraged patient to report any chest pains immediately.  Patient on telemetry, will continue to monitor.

## 2016-03-18 NOTE — Progress Notes (Signed)
Physical Therapy Treatment Patient Details Name: Steven Higgins MRN: [id number] DOB: [birth year] Today's Date: 03/18/2016    History of Present Illness 46 yo male with episode of seizure on 03/15/16. Imaging shows small ICH, and patient has history of HTN.    PT Comments    Pt continues to progress with mobility.  Reports fatigue during gait with mild dyspnea. SPO2 on RA 96%, HR 93 bpm.  NO c/o dizziness just reports feeling swimmy headed from being in bed.  Pt remains to present with sway/drift during gait but NO LOB noted.  Minor LOB to the R with L SLS.  Will continue to follow patient per POC with emphasis on high level balance activities.     Follow Up Recommendations  Supervision/Assistance - 24 hour;Supervision for mobility/OOB     Equipment Recommendations   (TBD)    Recommendations for Other Services       Precautions / Restrictions Precautions Precautions: Fall;Other (comment) Precaution Comments: Monitor BP Restrictions Weight Bearing Restrictions: No    Mobility  Bed Mobility Overal bed mobility: Independent                Transfers Overall transfer level: Modified independent Equipment used: None             General transfer comment: Good technique to and from seated surface.    Ambulation/Gait Ambulation/Gait assistance: Supervision Ambulation Distance (Feet): 150 Feet Assistive device: None (no use of hand rail during today's treatment.  ) Gait Pattern/deviations: Step-through pattern;Wide base of support;Drifts right/left;Trunk flexed   Gait velocity interpretation: Below normal speed for age/gender General Gait Details: Cues for upper trunk control, mild drifting.  Pt reports feeling swimmy headed from lack of mobility but reports he feels close to his baseline.     Stairs            Wheelchair Mobility    Modified Rankin (Stroke Patients Only)       Balance Overall balance assessment: Needs assistance   Sitting  balance-Leahy Scale: Normal       Standing balance-Leahy Scale: Fair   Single Leg Stance - Right Leg: 30 Single Leg Stance - Left Leg: 15 (LOB to the R after 15 sec) Tandem Stance - Right Leg: 30 Tandem Stance - Left Leg: 30 Rhomberg - Eyes Opened: 30 Rhomberg - Eyes Closed: 30        Cognition Arousal/Alertness: Awake/alert Behavior During Therapy: WFL for tasks assessed/performed Overall Cognitive Status: Impaired/Different from baseline                      Exercises      General Comments        Pertinent Vitals/Pain Pain Assessment: No/denies pain Pain Score: 0-No pain    Home Living     Available Help at Discharge: Family;Available 24 hours/day Type of Home: Apartment              Prior Function            PT Goals (current goals can now be found in the care plan section) Acute Rehab PT Goals Patient Stated Goal: Get back home Potential to Achieve Goals: Good Progress towards PT goals: Progressing toward goals    Frequency    Min 4X/week      PT Plan Current plan remains appropriate    Co-evaluation             End of Session Equipment Utilized During Treatment: Gait belt Activity Tolerance: Treatment limited  secondary to medical complications (Comment) Patient left: in bed;with call bell/phone within reach;with family/visitor present;with bed alarm set Nurse Communication: Mobility status PT Visit Diagnosis: Other abnormalities of gait and mobility (R26.89);Pain Pain - Right/Left: Left Pain - part of body: Hip     Time: 1610-9604 PT Time Calculation (min) (ACUTE ONLY): 14 min  Charges:  $Gait Training: 8-22 mins                    G Codes:       Florestine Avers 2016-04-11, 11:44 AM Joycelyn Rua, PTA pager [phone]

## 2016-03-18 NOTE — Evaluation (Signed)
Speech Language Pathology Evaluation Patient Details Name: Steven Higgins MRN: [id number] DOB: 11/06/1970 Today's Date: 03/18/2016 Time: 8299-3716 SLP Time Calculation (min) (ACUTE ONLY): 32 min  Problem List:  Patient Active Problem List   Diagnosis Date Noted  . ICH (intracerebral hemorrhage) (HCC) 03/15/2016   Past Medical History:  Past Medical History:  Diagnosis Date  . Hypertension    Past Surgical History: History reviewed. No pertinent surgical history. HPI:  46 yo male with episode of seizure on 03/15/16. Imaging shows small left parietal ICH, and patient has history of HTN.   Assessment / Plan / Recommendation Clinical Impression  Patient presents with moderate cognitive communication impairment with deficits in attention (3/6), language (0/3), and delayed recall (0/5). Naming appears intact, however suspect attention and language impacting delayed recall. Administered MOCA form 7.3; patient scored 17/30. He demonstrates reduced awareness of deficits, "I don't feel like anything is different." With verbal cues at completion of testing, he did acknowledge he had difficulty on the test which he felt would have been easier for him before his stroke. Provided education regarding potential functional impacts of cognitive deficits. Recommending 24 hour supervision at discharge with follow-up in OP for cognitive-linguistic deficits. No acute f/u from SLP recommended at this time as all needs can be addressed in next level of care. Patient verbalizes understanding and is in agreement with this plan.     SLP Assessment  SLP Recommendation/Assessment: All further Speech Lanaguage Pathology  needs can be addressed in the next venue of care SLP Visit Diagnosis: Cognitive communication deficit (R41.841)    Follow Up Recommendations  24 hour supervision/assistance;Outpatient SLP    Frequency and Duration           SLP Evaluation Cognition  Overall Cognitive Status: Impaired/Different  from baseline Arousal/Alertness: Awake/alert Orientation Level: Oriented X4 Attention: Focused;Sustained;Selective Focused Attention: Impaired Focused Attention Impairment: Verbal complex;Functional basic Sustained Attention: Impaired Sustained Attention Impairment: Verbal complex;Functional complex Selective Attention: Impaired Selective Attention Impairment: Verbal complex;Functional complex Memory: Impaired Memory Impairment: Storage deficit;Decreased recall of new information Awareness: Impaired Awareness Impairment: Intellectual impairment Problem Solving: Appears intact Executive Function: Reasoning;Sequencing;Organizing Reasoning: Appears intact Sequencing: Appears intact Organizing: Appears intact Safety/Judgment: Impaired       Comprehension  Auditory Comprehension Overall Auditory Comprehension: Appears within functional limits for tasks assessed Yes/No Questions: Within Functional Limits Commands: Within Functional Limits Conversation: Complex Interfering Components: Attention EffectiveTechniques: Repetition Visual Recognition/Discrimination Discrimination: Within Function Limits Reading Comprehension Reading Status: Not tested    Expression Expression Primary Mode of Expression: Verbal Verbal Expression Overall Verbal Expression: Impaired Initiation: No impairment Automatic Speech: Name;Social Response Level of Generative/Spontaneous Verbalization: Conversation Repetition: Impaired Level of Impairment: Sentence level Naming: No impairment Interfering Components: Attention Non-Verbal Means of Communication: Not applicable Written Expression Dominant Hand: Right Written Expression: Not tested   Oral / Motor  Oral Motor/Sensory Function Overall Oral Motor/Sensory Function: Within functional limits Motor Speech Overall Motor Speech: Appears within functional limits for tasks assessed Respiration: Within functional limits Phonation: Normal Resonance:  Within functional limits Articulation: Within functional limitis Intelligibility: Intelligible Motor Planning: Witnin functional limits Motor Speech Errors: Not applicable   GO                   Rondel Baton, MS CF-SLP Speech-Language Pathologist [phone]   Arlana Lindau 03/18/2016, 9:50 AM

## 2016-03-18 NOTE — Progress Notes (Addendum)
Received notification of rhythm change to junctional rhythm from the telemetry tech, patient is asymptomatic at this time.

## 2016-03-18 NOTE — Evaluation (Signed)
Occupational Therapy Evaluation Patient Details Name: Steven Higgins MRN: [id number] DOB: 04-14-1970 Today's Date: 03/18/2016    History of Present Illness 46 yo male with episode of seizure on 03/15/16. Imaging shows small ICH, and patient has history of HTN.   Clinical Impression   PTA, pt was independent with ADL and functional mobility. He currently is able to complete ADL in hospital setting without physical assistance. However, he does present with decreased short-term memory, emergent awareness, decreased safety awareness, and decreased attention to task limiting his safety with IADL, medication management, and work related tasks. He would benefit from continued OT services while admitted to improve safety post-acute D/C and facilitate return to PLOF. Recommend 24 hour assistance post-acute D/C and outpatient OT services if deficits persist.    Follow Up Recommendations  Supervision/Assistance - 24 hour;Outpatient OT    Equipment Recommendations  None recommended by OT    Recommendations for Other Services       Precautions / Restrictions Precautions Precautions: Fall;Other (comment) Precaution Comments: Monitor BP Restrictions Weight Bearing Restrictions: No      Mobility Bed Mobility Overal bed mobility: Independent                Transfers Overall transfer level: Modified independent Equipment used: None                  Balance Overall balance assessment: Needs assistance   Sitting balance-Leahy Scale: Normal     Standing balance support: No upper extremity supported Standing balance-Leahy Scale: Fair                              ADL Overall ADL's : Independent;At baseline                                       General ADL Comments: Able to complete all ADL without physical assistance on evaluation.     Vision Baseline Vision/History: No visual deficits Patient Visual Report: No change from baseline Vision  Assessment?: No apparent visual deficits Additional Comments: Central and peripheral vision in tact for tasks assessed. No diplopia or blurry vision reported. Able to read menu with no difficulties.     Perception     Praxis      Pertinent Vitals/Pain Pain Assessment: No/denies pain     Hand Dominance Right   Extremity/Trunk Assessment Upper Extremity Assessment Upper Extremity Assessment: Overall WFL for tasks assessed   Lower Extremity Assessment Lower Extremity Assessment: Overall WFL for tasks assessed   Cervical / Trunk Assessment Cervical / Trunk Assessment: Normal   Communication Communication Communication: No difficulties   Cognition Arousal/Alertness: Awake/alert Behavior During Therapy: WFL for tasks assessed/performed Overall Cognitive Status: Impaired/Different from baseline Area of Impairment: Attention;Memory;Safety/judgement;Awareness;Problem solving   Current Attention Level: Sustained Memory: Decreased short-term memory   Safety/Judgement: Decreased awareness of safety Awareness: Emergent Problem Solving: Slow processing General Comments: Pt tends to hyperfocus on topics and is easily distracted at times.   General Comments       Exercises       Shoulder Instructions      Home Living Family/patient expects to be discharged to:: Private residence Living Arrangements: Alone Available Help at Discharge: Family;Available 24 hours/day Type of Home: Apartment Home Access: Level entry     Home Layout: One level     Bathroom Shower/Tub: Tub/shower unit   [employer]  Toilet: Standard     Home Equipment: None      Lives With: Alone    Prior Functioning/Environment Level of Independence: Independent        Comments: Reports he is normally active, recreational athletics, etc.        OT Problem List: Impaired balance (sitting and/or standing);Decreased cognition      OT Treatment/Interventions: Self-care/ADL training;Therapeutic  activities;Cognitive remediation/compensation    OT Goals(Current goals can be found in the care plan section) Acute Rehab OT Goals Patient Stated Goal: Get back home OT Goal Formulation: With patient Time For Goal Achievement: 03/25/16 Potential to Achieve Goals: Good ADL Goals Additional ADL Goal #1: Pt will incorporate 3 strategies to improve memory in order to improve safety with medication management tasks. Additional ADL Goal #2: Pt will complete simple path-finding task utilizing contextual cues with no more than 1 VC.  OT Frequency: Min 2X/week   Barriers to D/C:            Co-evaluation              End of Session Nurse Communication: Mobility status  Activity Tolerance: Patient tolerated treatment well Patient left: in bed;with call bell/phone within reach;with bed alarm set;with family/visitor present  OT Visit Diagnosis: Unsteadiness on feet (R26.81)                ADL either performed or assessed with clinical judgement  Time: 1350-1405 OT Time Calculation (min): 15 min Charges:  OT General Charges $OT Visit: 1 Procedure OT Evaluation $OT Eval Low Complexity: 1 Procedure G-Codes:     Doristine Section, MS OTR/L  Pager: [phone]   Kouper Spinella A Cashae Weich 03/18/2016, 3:46 PM

## 2016-03-19 DIAGNOSIS — I610 Nontraumatic intracerebral hemorrhage in hemisphere, subcortical: Secondary | ICD-10-CM

## 2016-03-19 LAB — METANEPHRINES, PLASMA
Metanephrine, Free: 36 pg/mL (ref 0–62)
NORMETANEPHRINE FREE: 169 pg/mL — AB (ref 0–145)

## 2016-03-19 LAB — POTASSIUM: POTASSIUM: 3.5 mmol/L (ref 3.5–5.1)

## 2016-03-19 MED ORDER — POTASSIUM CHLORIDE CRYS ER 20 MEQ PO TBCR
20.0000 meq | EXTENDED_RELEASE_TABLET | Freq: Three times a day (TID) | ORAL | Status: AC
  Administered 2016-03-19 – 2016-03-20 (×4): 20 meq via ORAL
  Filled 2016-03-19 (×4): qty 1

## 2016-03-19 MED ORDER — LABETALOL HCL 200 MG PO TABS: 200.0000 mg | ORAL_TABLET | Freq: Three times a day (TID) | ORAL | 2 refills | Status: DC

## 2016-03-19 MED ORDER — ATORVASTATIN CALCIUM 20 MG PO TABS: 20.0000 mg | ORAL_TABLET | Freq: Every day | ORAL | 2 refills | Status: DC

## 2016-03-19 MED ORDER — LABETALOL HCL 5 MG/ML IV SOLN: 10.0000 mg | INTRAVENOUS | Status: DC | PRN

## 2016-03-19 MED ORDER — AMLODIPINE BESYLATE 10 MG PO TABS: 10.0000 mg | ORAL_TABLET | Freq: Every day | ORAL | 2 refills | Status: DC

## 2016-03-19 MED ORDER — NICOTINE 14 MG/24HR TD PT24
14.0000 mg | MEDICATED_PATCH | Freq: Every day | TRANSDERMAL | Status: DC
  Filled 2016-03-19 (×2): qty 1

## 2016-03-19 MED ORDER — LEVETIRACETAM 500 MG PO TABS: 500.0000 mg | ORAL_TABLET | Freq: Two times a day (BID) | ORAL | 2 refills | Status: DC

## 2016-03-19 MED ORDER — SIMETHICONE 80 MG PO CHEW
80.0000 mg | CHEWABLE_TABLET | Freq: Four times a day (QID) | ORAL | Status: DC | PRN
  Administered 2016-03-19: 80 mg via ORAL
  Filled 2016-03-19: qty 1

## 2016-03-19 NOTE — Progress Notes (Signed)
   03/19/16 1337  Vitals  Temp 98.4 F (36.9 C)  Temp Source Oral  BP (!) 165/103  BP Location Right Arm  BP Method Automatic  Patient Position (if appropriate) Lying  Pulse Rate 75  Pulse Rate Source Dinamap  ECG Heart Rate (!) 18  Oxygen Therapy  SpO2 97 %   Notified Annie Main, NP of pt.'s BP. Orders received for parameter change to 180.

## 2016-03-19 NOTE — Progress Notes (Signed)
STROKE TEAM PROGRESS NOTE    SUBJECTIVE (INTERVAL HISTORY) His sister is at the bedside. Pt reports hx of HTN x 10 years. He took meds for a while, but them stopped. He became dizzy while taking the meds, did not like what he thought were side effects and stopped the medications. Advised to cut out all additional salt from diet.    OBJECTIVE Temp:  [98.1 F (36.7 C)-98.6 F (37 C)] 98.6 F (37 C) (03/19 0953) Pulse Rate:  [77-89] 78 (03/19 0953) Cardiac Rhythm: Normal sinus rhythm (03/19 0700) Resp:  [16-18] 18 (03/19 0953) BP: (144-180)/(97-117) 144/102 (03/19 1225) SpO2:  [96 %-99 %] 97 % (03/19 0953)  CBC:   Recent Labs Lab 03/15/16 1904  03/17/16 0455 03/18/16 0403  WBC 8.4  < > 8.0 7.2  NEUTROABS 6.4  --   --  3.3  HGB 14.9  < > 14.1 13.6  HCT 44.1  < > 42.9 41.2  MCV 81.5  < > 83.5 83.4  PLT 188  < > 189 186  < > = values in this interval not displayed.  Basic Metabolic Panel:   Recent Labs Lab 03/17/16 0455 03/18/16 0403 03/19/16 0339  NA 139 137  --   K 2.9* 3.1* 3.5  CL 101 100*  --   CO2 28 29  --   GLUCOSE 115* 111*  --   BUN 6 8  --   CREATININE 1.18 1.22  --   CALCIUM 8.9 9.0  --   MG 1.7 1.7  --     Lipid Panel:     Component Value Date/Time   CHOL 217 (H) 03/16/2016 0705   TRIG 79 03/16/2016 0705   HDL 50 03/16/2016 0705   CHOLHDL 4.3 03/16/2016 0705   VLDL 16 03/16/2016 0705   LDLCALC 151 (H) 03/16/2016 0705   HgbA1c:  Lab Results  Component Value Date   HGBA1C 6.1 (H) 03/16/2016   Urine Drug Screen:     Component Value Date/Time   LABOPIA NONE DETECTED 03/15/2016 2049   COCAINSCRNUR NONE DETECTED 03/15/2016 2049   LABBENZ NONE DETECTED 03/15/2016 2049   AMPHETMU NONE DETECTED 03/15/2016 2049   THCU NONE DETECTED 03/15/2016 2049   LABBARB NONE DETECTED 03/15/2016 2049     PHYSICAL EXAM General - Well nourished, well developed, in no apparent distress.  Ophthalmologic - Fundi not visualized due to at TTE  testing.  Cardiovascular - Regular rate and rhythm.  Mental Status -  Level of arousal and orientation to time, place, and person were intact. Language including expression, naming, repetition, comprehension was assessed and found intact. Attention span and concentration were normal. Fund of Knowledge was assessed and was intact.  Cranial Nerves II - XII - II - Visual field intact OU. III, IV, VI - Extraocular movements intact. V - Facial sensation intact bilaterally. VII - Facial movement intact bilaterally. VIII - Hearing & vestibular intact bilaterally. X - Palate elevates symmetrically. XI - Chin turning & shoulder shrug intact bilaterally XII - Tongue protrusion intact.  Motor Strength - The patient's strength was normal in all extremities and pronator drift was absent.  Bulk was normal and fasciculations were absent.   Motor Tone - Muscle tone was assessed at the neck and appendages and was normal.  Reflexes - The patient's reflexes were 1+ in all extremities and he had no pathological reflexes.  Sensory - Light touch, temperature/pinprick were assessed and were symmetrical.    Coordination - The patient had normal movements  in the hands and feet with no ataxia or dysmetria.  Tremor was absent.  Gait and Station - deferred due to during TTE test.   ASSESSMENT/PLAN [name] is a 46 y.o. male with history of hypertension, hypokalemia, previous infarcts by imaging, and ongoing tobacco use presenting with new onset seizure. He did not receive IV t-PA due to acute left parietal hemorrhage.  Seizure -  likely due to hypertensive emergency, less likely due to the tiny left parietal ICH  now onset  On keppra 500mg  bid  EEG - normal  ICH - tiny left parietal ICH, likely secondary to untreated hypertension due to noncompliance. Also multiple CMBs on MRI support long standing untreated HTN.  Resultant no neuro deficit  MRI - left parietal lobe tiny acute hemorrhage.  Chronic lacunar infarcts in the basal ganglia. Multiple CMBs throughout.  MRA - No high-grade stenosis, large vessel occlusion, or aneurysm identified.  Carotid Doppler - negative  2D Echo - 40% to 45%. Diffuse hypokinesis. No cardiac source of emboli identified.  LDL - 151  HgbAc - 6.1  VTE prophylaxis - SCDs Diet regular Room service appropriate? Yes; Fluid consistency: Thin  aspirin 81 mg daily prior to admission, now on No antithrombotic - secondary to hemorrhage.  Ongoing aggressive stroke risk factor management  Therapy recommendations: 24 hr per day supervision recommended. OP OT  Disposition: Pending  Anticipate discharge later today pending BP   Hypertensive emergency  Stable now - off Cardene drip -> Labetalol increased.  On amlodipine and labetalol  Long-term BP goal normotensive  Medication compliance counseling       Renal artery duplex - negative for RAS  Metanephrines, Catacholamines, Aldosterone and Renin - pending for evaluation of secondary HTN  Checked orthostatics -> unremarkable  Increase SBP goal to < 180. Discussed with RN  Will take time for BP to lower - discharge home and monitor  Hyperlipidemia  Home meds: No lipid lowering medications prior to admission  LDL 151, goal < 70  Now on Lipitor 20 mg daily  Continue statin at discharge  Tobacco abuse  Current smoker  Smoking cessation counseling provided  Pt is willing to quit  Nicotine patch 14 mg daily added  Hypokalemia   K 2.6-2.5-2.8 -> 2.9 -> 3.1 -> 3.5 continue potassium  Check magnesium in am -> 1.7 supplement   Other Stroke Risk Factors  ETOH use, advised to drink no more than 1 drink per day  Hx stroke on imaging  Other Active Problems  Prednisone therapy prior to admission - ?   back and hip pain secondary to fall associated with seizure.  Lumbar spine films - as above. Less pain as hospitalization progressed  Hospital day # 4  [facility]  Stroke Center See Amion for Pager information 03/19/2016 1:31 PM  I have personally examined this patient, reviewed notes, independently viewed imaging studies, participated in medical decision making and plan of care.ROS completed by me personally and pertinent positives fully documented  I have made any additions or clarifications directly to the above note. Agree with note above.   Delia Heady, MD Medical Director [facility] Pager: [phone] 03/19/2016 6:59 PM  To contact Stroke Continuity provider, please refer to [email or web address]. After hours, contact General Neurology

## 2016-03-19 NOTE — Progress Notes (Signed)
Occupational Therapy Treatment Patient Details Name: Steven Higgins MRN: [id number] DOB: [birth year] Today's Date: 03/19/2016    History of present illness 46 yo male with episode of seizure on 03/15/16. Imaging shows small ICH, and patient has history of HTN.   OT comments  Pt progressing well toward OT goals. He was able to complete simple financial management/check writing tasks with min assist this session. Pt able to complete simple path-finding activity in hallway utilizing contextual cues with supervision. He continues to demonstrate decreased short-term memory which is impacting his independence and safety with IADL tasks. Educated pt and sister concerning compensatory strategies for memory in order to improve safety with medication management and other IADL. Continue to recommend outpatient OT services post-acute D/C. All further OT needs can be met at next venue of care. Acute OT will sign off.    Follow Up Recommendations  Supervision/Assistance - 24 hour;Outpatient OT    Equipment Recommendations  Tub/shower seat    Recommendations for Other Services      Precautions / Restrictions Precautions Precautions: Fall;Other (comment) Precaution Comments: Monitor BP Restrictions Weight Bearing Restrictions: No       Mobility Bed Mobility Overal bed mobility: Independent             General bed mobility comments: OOB in chair on arrival  Transfers Overall transfer level: Independent Equipment used: None             General transfer comment: Good technique to and from seated surface.      Balance Overall balance assessment: Needs assistance Sitting-balance support: No upper extremity supported;Feet supported Sitting balance-Leahy Scale: Normal     Standing balance support: No upper extremity supported;During functional activity Standing balance-Leahy Scale: Good                 High Level Balance Comments: Modified SLS at 12 inch stair.  Minor swaying  with eyes closed but no true LOB noted.     ADL                                         General ADL Comments: Pt able to complete basic ADL tasks independently. Requires VC's for safety with IADL tasks. He demonstrated difficulty with check writing/financial management tasks this session and demonstrates poor short-term memory skills related to medication management. Educated pt concerning compensatory strategies including chunking, list-making, and pill organization to improve safety and independence with IADL tasks.      Vision                 Additional Comments: Appears in tact   Perception     Praxis      Cognition   Behavior During Therapy: WFL for tasks assessed/performed Overall Cognitive Status: Impaired/Different from baseline Area of Impairment: Memory     Memory: Decreased short-term memory          General Comments: Decreased short-term memory skills related to IADL. Able to complete simple pathfinding tasks this session with supervision for safety.      Exercises     Shoulder Instructions       General Comments      Pertinent Vitals/ Pain       Pain Assessment: No/denies pain Pain Score: 0-No pain  Home Living  Prior Functioning/Environment Level of Independence: Independent        Comments: Reports he is normally active, recreational athletics, etc.   Frequency  Min 2X/week        Progress Toward Goals  OT Goals(current goals can now be found in the care plan section)  Progress towards OT goals: Progressing toward goals  Acute Rehab OT Goals Patient Stated Goal: Get back home OT Goal Formulation: With patient Time For Goal Achievement: 03/25/16 Potential to Achieve Goals: Good ADL Goals Additional ADL Goal #1: Pt will incorporate 3 strategies to improve memory in order to improve safety with medication management tasks. Additional ADL Goal #2: Pt  will complete simple path-finding task utilizing contextual cues with no more than 1 VC.  Plan Discharge plan remains appropriate    Co-evaluation                 End of Session    OT Visit Diagnosis: Unsteadiness on feet (R26.81)   Activity Tolerance Patient tolerated treatment well   Patient Left in bed;with call bell/phone within reach;with family/visitor present (sitting at EOB; signed off as independent per nursing)   Nurse Communication Mobility status        Time: 6438-3779 OT Time Calculation (min): 23 min  Charges: OT General Charges $OT Visit: 1 Procedure OT Treatments $Self Care/Home Management : 8-22 mins $Therapeutic Activity: 8-22 mins  Norman Herrlich, MS OTR/L  Pager: [place] A Bonna Steury 03/19/2016, 5:08 PM

## 2016-03-19 NOTE — Progress Notes (Addendum)
Pt has met all goals and has no further acute PT needs at this time. Agree with below assessment.  Kittie Plater, PT, DPT Pager #: [phone] Office #: [phone]   Physical Therapy Treatment Patient Details Name: Steven Higgins MRN: [id number] DOB: 1970/08/13 Today's Date: 03/19/2016    History of Present Illness 46 yo male with episode of seizure on 03/15/16. Imaging shows small ICH, and patient has history of HTN.    PT Comments    Pt performing all mobility within functional limits with no AD.  Pt does not require an AD and has no complaints of dizziness despite elevated BPs.  Pt scored 24/24 on DGI.  Will inform supervising PT to sign off patient at this time.       03/19/16 1443  Balance  Standardized Balance Assessment  Standardized Balance Assessment  Dynamic Gait Index  Dynamic Gait Index  Level Surface 3  Change in Gait Speed 3  Gait with Horizontal Head Turns 3  Gait with Vertical Head Turns 3  Gait and Pivot Turn 3  Step Over Obstacle 3  Step Around Obstacles 3  Steps 3  Total Score 24    Follow Up Recommendations  Supervision/Assistance - 24 hour;Supervision for mobility/OOB     Equipment Recommendations       Recommendations for Other Services       Precautions / Restrictions Precautions Precautions: Fall;Other (comment) Precaution Comments: Monitor BP Restrictions Weight Bearing Restrictions: No    Mobility  Bed Mobility Overal bed mobility: Independent                Transfers Overall transfer level: Independent Equipment used: None             General transfer comment: Good technique to and from seated surface.    Ambulation/Gait Ambulation/Gait assistance: Independent Ambulation Distance (Feet): 300 Feet Assistive device: None Gait Pattern/deviations: Step-to pattern;WFL(Within Functional Limits)   Gait velocity interpretation: Below normal speed for age/gender General Gait Details: Pt with no LOB and no gait deviations.      Stairs Stairs: Yes   Stair Management: Two rails;Alternating pattern Number of Stairs: 10 General stair comments: No cues, good technique  Wheelchair Mobility    Modified Rankin (Stroke Patients Only)       Balance Overall balance assessment: Needs assistance   Sitting balance-Leahy Scale: Normal       Standing balance-Leahy Scale: Good                 High Level Balance Comments: Modified SLS at 12 inch stair.  Minor swaying with eyes closed but no true LOB noted.      Cognition Arousal/Alertness: Awake/alert Behavior During Therapy: WFL for tasks assessed/performed Overall Cognitive Status: Within Functional Limits for tasks assessed                      Exercises      General Comments        Pertinent Vitals/Pain Pain Assessment: No/denies pain Pain Score: 0-No pain    Home Living                      Prior Function            PT Goals (current goals can now be found in the care plan section) Acute Rehab PT Goals Patient Stated Goal: Get back home Potential to Achieve Goals: Good Progress towards PT goals: Progressing toward goals    Frequency  Min 4X/week      PT Plan Current plan remains appropriate    Co-evaluation             End of Session   Activity Tolerance: Treatment limited secondary to medical complications (Comment) Patient left: in bed;with call bell/phone within reach;with family/visitor present Nurse Communication: Mobility status (pain in abdomen, reports feeling full.  ) PT Visit Diagnosis: Other abnormalities of gait and mobility (R26.89);Pain Pain - Right/Left: Left Pain - part of body: Hip     Time: 0277-4128 PT Time Calculation (min) (ACUTE ONLY): 12 min  Charges:  $Gait Training: 8-22 mins                    G Codes:       Cristela Blue March 21, 2016, 2:47 PM Governor Rooks, PTA pager [phone]

## 2016-03-19 NOTE — Progress Notes (Signed)
Pt. c/o episode of "heart fluttering." Resolved now. Tele- NSR. Pt. States has these episodes at home only when he smokes. Also requests nicotine patch and to be discharged home on chantix. Annie Main, NP made aware. Will discuss on rounds.

## 2016-03-19 NOTE — Progress Notes (Signed)
Noted BP elevated; re-checked manually- 164/110. Just gave 10am BP meds; will re-check BP once meds have taken effect.

## 2016-03-19 NOTE — Progress Notes (Signed)
PT Cancellation Note  Patient Details Name: Steven Higgins MRN: [id number] DOB: 1970/08/10   Cancelled Treatment:    Reason Eval/Treat Not Completed: Patient not medically ready (Pt sitting edge of bed on arrival, BP taken in LUE sitting 180/110, returned patient to supine and BP 168/117.  Informed RN and will cancel treatment until BP improves.  Pt reports he is feeling fine.  )   Loanne Emery Artis Delay 03/19/2016, 11:58 AM Joycelyn Rua, PTA pager [phone]

## 2016-03-20 DIAGNOSIS — W19XXXA Unspecified fall, initial encounter: Secondary | ICD-10-CM | POA: Diagnosis present

## 2016-03-20 DIAGNOSIS — E876 Hypokalemia: Secondary | ICD-10-CM | POA: Diagnosis present

## 2016-03-20 DIAGNOSIS — I161 Hypertensive emergency: Secondary | ICD-10-CM | POA: Diagnosis present

## 2016-03-20 DIAGNOSIS — M25559 Pain in unspecified hip: Secondary | ICD-10-CM | POA: Diagnosis present

## 2016-03-20 DIAGNOSIS — Z720 Tobacco use: Secondary | ICD-10-CM | POA: Diagnosis present

## 2016-03-20 DIAGNOSIS — M549 Dorsalgia, unspecified: Secondary | ICD-10-CM | POA: Diagnosis present

## 2016-03-20 LAB — CATECHOLAMINES, FRACTIONATED, PLASMA
EPINEPHRINE: 20 pg/mL (ref 0–62)
Norepinephrine: 426 pg/mL (ref 0–874)

## 2016-03-20 MED ORDER — LEVETIRACETAM 500 MG PO TABS: 500.0000 mg | ORAL_TABLET | Freq: Two times a day (BID) | ORAL | 2 refills | Status: DC

## 2016-03-20 MED ORDER — AMLODIPINE BESYLATE 10 MG PO TABS: 10.0000 mg | ORAL_TABLET | Freq: Every day | ORAL | 2 refills | Status: DC

## 2016-03-20 MED ORDER — LABETALOL HCL 200 MG PO TABS: 200.0000 mg | ORAL_TABLET | Freq: Three times a day (TID) | ORAL | 2 refills | Status: DC

## 2016-03-20 MED ORDER — NICOTINE 14 MG/24HR TD PT24: 14.0000 mg | MEDICATED_PATCH | Freq: Every day | TRANSDERMAL | 0 refills | Status: DC

## 2016-03-20 MED ORDER — ATORVASTATIN CALCIUM 20 MG PO TABS: 20.0000 mg | ORAL_TABLET | Freq: Every day | ORAL | 2 refills | Status: DC

## 2016-03-20 MED FILL — ATORVASTATIN 20 MG TABLET: 20 | 30 days supply | Qty: 30 | Fill #0

## 2016-03-20 MED FILL — levETIRAcetam 500 MG TABS: 500 | 30 days supply | Qty: 60 | Fill #0

## 2016-03-20 MED FILL — AMLODIPINE BESYLATE 10 MG T: 10 | 30 days supply | Qty: 30 | Fill #0

## 2016-03-20 MED FILL — LABETALOL HCL 200 MG TABLET: 200 | 30 days supply | Qty: 90 | Fill #0

## 2016-03-20 NOTE — Discharge Summary (Signed)
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  Past Medical History:  Diagnosis Date  . Hypertension    History reviewed. No pertinent surgical history.  Allergies as of 03/20/2016   No Known Allergies     Medication List    STOP taking these medications   aspirin EC 81 MG tablet   cyclobenzaprine 10 MG tablet Commonly known as:  FLEXERIL   ibuprofen 800 MG tablet Commonly known as:  ADVIL,MOTRIN   lisinopril 20 MG tablet Commonly known as:  PRINIVIL,ZESTRIL   metoprolol 50 MG tablet Commonly known as:  LOPRESSOR   oxyCODONE-acetaminophen 5-325 MG tablet Commonly known as:  PERCOCET/ROXICET   potassium chloride SA 20 MEQ tablet Commonly known as:  K-DUR,KLOR-CON   predniSONE 20 MG tablet Commonly known as:  DELTASONE     TAKE these medications   amLODipine 10 MG tablet Commonly known as:  NORVASC Take 1 tablet (10 mg total) by mouth daily.   atorvastatin 20 MG tablet Commonly known as:  LIPITOR Take 1 tablet (20 mg total) by mouth daily at 6 PM.   labetalol 200 MG tablet Commonly known as:  NORMODYNE Take 1 tablet (200 mg total) by mouth 3 (three) times daily.   levETIRAcetam 500 MG tablet Commonly known as:  KEPPRA Take 1 tablet (500 mg total) by mouth 2 (two) times daily.   nicotine 14 mg/24hr patch Commonly known as:  NICODERM CQ - dosed in  mg/24 hours Place 1 patch (14 mg total) onto the skin daily. Start taking on:  03/21/2016       LABORATORY STUDIES CBC    Component Value Date/Time   WBC 7.2 03/18/2016 0403   RBC 4.94 03/18/2016 0403   HGB 13.6 03/18/2016 0403   HCT 41.2 03/18/2016 0403   PLT 186 03/18/2016 0403   MCV 83.4 03/18/2016 0403   MCH 27.5 03/18/2016 0403   MCHC 33.0 03/18/2016 0403   RDW 13.8 03/18/2016 0403   LYMPHSABS 2.9 03/18/2016 0403   MONOABS 0.6 03/18/2016 0403   EOSABS 0.3 03/18/2016 0403   BASOSABS 0.0 03/18/2016 0403   CMP    Component Value Date/Time   NA 137 03/18/2016 0403   K 3.5 03/19/2016 0339   CL 100 (L) 03/18/2016 0403   CO2 29 03/18/2016 0403   GLUCOSE 111 (H) 03/18/2016 0403   BUN 8 03/18/2016 0403   CREATININE 1.22 03/18/2016 0403   CALCIUM 9.0 03/18/2016 0403   PROT 6.6 03/15/2016 1904   ALBUMIN 3.9 03/15/2016 1904   AST 35 03/15/2016 1904   ALT 36 03/15/2016 1904   ALKPHOS 57 03/15/2016 1904   BILITOT 0.5 03/15/2016 1904   GFRNONAA >60 03/18/2016 0403   GFRAA >60 03/18/2016 0403   Lipid Panel    Component Value Date/Time   CHOL 217 (H) 03/16/2016 0705   TRIG 79 03/16/2016  0705   HDL 50 03/16/2016 0705   CHOLHDL 4.3 03/16/2016 0705   VLDL 16 03/16/2016 0705   LDLCALC 151 (H) 03/16/2016 0705   HgbA1C  Lab Results  Component Value Date   HGBA1C 6.1 (H) 03/16/2016   Urinalysis    Component Value Date/Time   COLORURINE YELLOW 03/15/2016 2049   APPEARANCEUR HAZY (A) 03/15/2016 2049   LABSPEC 1.010 03/15/2016 2049   PHURINE 5.0 03/15/2016 2049   GLUCOSEU NEGATIVE 03/15/2016 2049   HGBUR MODERATE (A) 03/15/2016 2049   BILIRUBINUR NEGATIVE 03/15/2016 2049   KETONESUR NEGATIVE 03/15/2016 2049   PROTEINUR 100 (A) 03/15/2016 2049   NITRITE NEGATIVE 03/15/2016 2049   LEUKOCYTESUR NEGATIVE 03/15/2016 2049   Urine Drug Screen     Component Value Date/Time   LABOPIA NONE DETECTED 03/15/2016 2049   COCAINSCRNUR NONE DETECTED 03/15/2016 2049   LABBENZ  NONE DETECTED 03/15/2016 2049   AMPHETMU NONE DETECTED 03/15/2016 2049   THCU NONE DETECTED 03/15/2016 2049   LABBARB NONE DETECTED 03/15/2016 2049    SIGNIFICANT DIAGNOSTIC STUDIES Dg Chest 2 View 03/15/2016 No active cardiopulmonary disease.   Ct Head Wo Contrast 03/15/2016 3 mm hyperdensity/hemorrhage within the posterior left parietal region -no adjacent edema. This may represent a small incidental cavernoma. MRI may be helpful for further evaluation as clinically indicated. Periventricular white matter hypodensities - nonspecific but question chronic small-vessel ischemic changes.  Mr Brain Wo Contrast 03/15/2016 1. Hyperdense focus in left parietal lobe on CT demonstrates likely represents acute hemorrhage.  2. Numerous foci of chronic hemosiderin deposition with central predominance, diffuse enlargement of intracranial arterial flow voids, severe white matter disease for age, and sequelae of chronic lacunar infarcts in the basal ganglia. Findings are likely related to chronic severe hypertension, less likely vasculopathy such as CNS vasculitis or HIV associated.  3. Paranasal sinus disease predominantly in the right sphenoid and left maxillary sinuses.  4. 5 mm left paramedian frontal scalp lesion.   MRA Head Wo Contrast 01/17/2016 1. Mild bilateral M1 ectasia and bilateral M2 superior division short segments of mild stenosis. Combined with findings of prior MRI of the brain, this is likely to represent artery remodeling secondary to chronic hypertension, less likely vasculitis. 2. No high-grade stenosis, large vessel occlusion, or aneurysm identified.  Carotid Duplex   Findings suggest 1-39% internal carotid artery stenosis bilaterally. Vertebral arteries are patent with antegrade flow.  2-D echocardiogram  03/16/2016 - Left ventricle: The cavity size was at the upper limits ofnormal. Wall thickness was increased in a pattern of mild LVH.Systolic function was mildly to  moderately reduced. The estimatedejection fraction was in the range of 40% to 45%. Diffuse hypokinesis. - Aortic valve: There was no stenosis. There was mildregurgitation. - Mitral valve: There was trivial regurgitation. - Left atrium: The atrium was moderately dilated. - Right ventricle: The cavity size was normal. Systolic functionwas normal. - Right atrium: The atrium was moderately dilated. - Pulmonary arteries: No complete TR doppler jet so unable toestimate PA systolic pressure. - Systemic veins: IVC measured 2.7 cm with > 50% respirophasicvariation, suggesting RA pressure 8 mmHg. Impressions: - Borderline dilated LV with mild LV hypertrophy. EF 40-45%, diffuse hypokinesis. ormal RV size and systolic function. Mild aortic insufficiency.  Renal artery duplex  Findings suggest 1-59% renal artery stenosis bilaterally.  Lumbar Spine 4 View 03/17/2016 1. Loss of the normal lumbar lordosis may be related to muscle spasm. 2. Mild localized degenerative disc disease at L5-S1.  EEG  03/17/2014 EEG Abnormalities: None Clinical Interpretation: This  normal EEG is recorded in the waking and drowsystate. There was no seizure or seizure predisposition recorded on this study. Please note that a normal EEG does not preclude the possibility of epilepsy.      HISTORY OF PRESENT ILLNESS  Delroy Ordway is a 46 y.o. male who was in his normal state of health until earlier this evening on 03/15/2016 when he had a seizure. Family member describes that he was extended and had his eyes turned though she is not certain which way. He was shaking and bit his tongue. He has since returned to baseline. On evaluation for this in the emergency department a CT was performed which shows a small hemorrhage in the left parietal region. This was unclear on initial CT and therefore an MRI was performed which does demonstrate a small acute hemorrhage. He does not have any symptoms from this, and therefore not certain  we can say when it happened but likely would suspect that the seizure happened at onset. Of note, he has long-standing hypertension but has not taken his medicines and 3 years because the one that he was prescribed was $100 per month. ICH Score: 0    HOSPITAL COURSE Mr. Kristy Catoe is a 46 y.o. male with history of hypertension, hypokalemia, previous infarcts by imaging, and ongoing tobacco use presenting with new onset seizure. He did not receive IV t-PA due to acute left parietal hemorrhage.  Seizure -  likely due to hypertensive emergency, less likely due to the tiny left parietal ICH  new onset  Started on keppra 500mg  bid  EEG - normal  ICH - tiny left parietal ICH, likely secondary to untreated hypertension due to noncompliance. Also multiple CMBs on MRI support long standing untreated HTN.  Resultant no neuro deficit  MRI - left parietal lobe tiny acute hemorrhage. Chronic lacunar infarcts in the basal ganglia. Multiple CMBs throughout.  MRA - No high-grade stenosis, large vessel occlusion, or aneurysm identified.  Carotid Doppler - negative  2D Echo - 40% to 45%. Diffuse hypokinesis. No cardiac source of emboli identified.  LDL - 151  HgbAc - 6.1  aspirin 81 mg daily prior to admission, now on No antithrombotic - secondary to hemorrhage.  Ongoing aggressive stroke risk factor management  Therapy recommendations: OP OT  Disposition: home with OP OT  Hypertensive emergency  Stable now - initially treated with Cardene drip -> Labetalol increased.  On amlodipine and labetalol. Lisinopril on hold given elevated creatinine.  Long-term BP goal normotensive  Medication compliance counselingRenal artery duplex - negative for RAS  Metanephrines, Catacholamines, Aldosterone and Renin - pending for evaluation of secondary HTN  Checked orthostatics -> unremarkable  Patient has been hypertensive for some time. Will take time for BP to lower - discharge home and  monitor  Hyperlipidemia  Home meds: No lipid lowering medications prior to admission  LDL 151, goal < 70  Now on Lipitor 20 mg daily  Continue statin at discharge  Tobacco abuse  Current smoker  Smoking cessation counseling provided  Pt is willing to quit  Nicotine patch 14 mg daily added  Hypokalemia   Normalize after replacement. Will not continue at discharge.  Other Stroke Risk Factors  ETOH use, advised to drink no more than 1 drink per day  Hx stroke on imaging  Other Active Problems  Prednisone therapy listed prior to admission, the patient states he was not taking. Prednisone was discontinued   back and hip pain secondary to fall associated with seizure.  Lumbar spine films negative. Less pain as hospitalization progressed   DISCHARGE EXAM Blood pressure (!) 169/108, pulse 74, temperature 97.9 F (36.6 C), temperature source Oral, resp. rate 18, height 6\' 6"  (1.981 m), weight 96.3 kg (212 lb 6.4 oz), SpO2 95 %. General - Well nourished, well developed, in no apparent distress.  Ophthalmologic - Fundi not visualized due to at TTE testing.  Cardiovascular - Regular rate and rhythm.  Mental Status -  Level of arousal and orientation to time, place, and person were intact. Language including expression, naming, repetition, comprehension was assessed and found intact. Attention span and concentration were normal. Fund of Knowledge was assessed and was intact.  Cranial Nerves II - XII - II - Visual field intact OU. III, IV, VI - Extraocular movements intact. V - Facial sensation intact bilaterally. VII - Facial movement intact bilaterally. VIII - Hearing & vestibular intact bilaterally. X - Palate elevates symmetrically. XI - Chin turning & shoulder shrug intact bilaterally XII - Tongue protrusion intact.  Motor Strength - The patient's strength was normal in all extremities and pronator drift was absent.  Bulk was normal and fasciculations  were absent.   Motor Tone - Muscle tone was assessed at the neck and appendages and was normal.  Reflexes - The patient's reflexes were 1+ in all extremities and he had no pathological reflexes.  Sensory - Light touch, temperature/pinprick were assessed and were symmetrical.    Coordination - The patient had normal movements in the hands and feet with no ataxia or dysmetria.  Tremor was absent.  Gait and Station - deferred due to during TTE test.  Discharge Diet   Diet regular Room service appropriate? Yes; Fluid consistency: Thin liquids  DISCHARGE PLAN  Disposition:  Home  OP occupational therapy   No anti-thrombotic given intracerebral hemorrhage   Ongoing risk factor control by Primary Care Physician at time of discharge  Follow-up No PCP Per Patient in 2 weeks.  Follow-up with Dr. Delia Heady, Stroke Clinic in 6 weeks, office to schedule an appointment.  50 minutes were spent preparing discharge.  [facility] See Amion for Pager information 03/20/2016 5:31 PM   I have personally examined this patient, reviewed notes, independently viewed imaging studies, participated in medical decision making and plan of care.ROS completed by me personally and pertinent positives fully documented  I have made any additions or clarifications directly to the above note. Agree with note above.    Delia Heady, MD Medical Director [facility] St[facility] Pager: [phone] 03/20/2016 9:07 PM

## 2016-03-20 NOTE — Progress Notes (Signed)
Pt D/C home, no new concerns or needs, A&O. D/C instructions provided with teach back. Pts family will take pt home.

## 2016-03-20 NOTE — Care Management Note (Signed)
Case Management Note  Patient Details  Name: Steven Higgins MRN: [id number] Date of Birth: 1970/03/26  Subjective/Objective:                    Action/Plan: CM consulted for medication assistance. Pt has no PCP and no insurance. CM met with the patient and asked about the [facility]. Pt was interested. CM was able to obtain him an appointment on Thursday the 22nd. Information given to the patient and placed on the AVS. CM went over with him the use of the pharmacy at the [facility] for assistance with his medications. Pt voiced understanding. Pt to get current prescriptions filled at [facility] when he discharges.   Expected Discharge Date:  03/20/16               Expected Discharge Plan:  Home/Self Care  In-House Referral:     Discharge planning Services  CM Consult  Post Acute Care Choice:    Choice offered to:     DME Arranged:    DME Agency:     HH Arranged:    HH Agency:     Status of Service:  Completed, signed off  If discussed at [employer] of Stay Meetings, dates discussed:    Additional Comments:  Pollie Friar, RN 03/20/2016, 2:36 PM

## 2016-03-21 LAB — ALDOSTERONE + RENIN ACTIVITY W/ RATIO
ALDO / PRA RATIO: 54.1 — AB (ref 0.0–30.0)
Aldosterone: 9.9 ng/dL (ref 0.0–30.0)
PRA LC/MS/MS: 0.183 ng/mL/hr (ref 0.167–5.380)

## 2016-03-22 ENCOUNTER — Ambulatory Visit: Payer: Self-pay | Attending: Internal Medicine | Admitting: Physician Assistant

## 2016-03-22 VITALS — BP 163/105 | HR 74 | Temp 98.2°F | Wt 215.8 lb

## 2016-03-22 DIAGNOSIS — F17200 Nicotine dependence, unspecified, uncomplicated: Secondary | ICD-10-CM

## 2016-03-22 DIAGNOSIS — I619 Nontraumatic intracerebral hemorrhage, unspecified: Secondary | ICD-10-CM

## 2016-03-22 DIAGNOSIS — R569 Unspecified convulsions: Secondary | ICD-10-CM

## 2016-03-22 DIAGNOSIS — I420 Dilated cardiomyopathy: Secondary | ICD-10-CM

## 2016-03-22 DIAGNOSIS — E876 Hypokalemia: Secondary | ICD-10-CM | POA: Insufficient documentation

## 2016-03-22 DIAGNOSIS — Z09 Encounter for follow-up examination after completed treatment for conditions other than malignant neoplasm: Secondary | ICD-10-CM | POA: Insufficient documentation

## 2016-03-22 DIAGNOSIS — I629 Nontraumatic intracranial hemorrhage, unspecified: Secondary | ICD-10-CM | POA: Insufficient documentation

## 2016-03-22 DIAGNOSIS — E785 Hyperlipidemia, unspecified: Secondary | ICD-10-CM

## 2016-03-22 DIAGNOSIS — I10 Essential (primary) hypertension: Secondary | ICD-10-CM | POA: Insufficient documentation

## 2016-03-22 LAB — BASIC METABOLIC PANEL
BUN: 13 mg/dL (ref 7–25)
CHLORIDE: 100 mmol/L (ref 98–110)
CO2: 31 mmol/L (ref 20–31)
CREATININE: 1.07 mg/dL (ref 0.60–1.35)
Calcium: 9.4 mg/dL (ref 8.6–10.3)
Glucose, Bld: 84 mg/dL (ref 65–99)
Potassium: 4 mmol/L (ref 3.5–5.3)
Sodium: 141 mmol/L (ref 135–146)

## 2016-03-22 MED ORDER — LISINOPRIL 10 MG PO TABS: 10.0000 mg | ORAL_TABLET | Freq: Every day | ORAL | 3 refills | Status: DC

## 2016-03-22 NOTE — Progress Notes (Signed)
Steven Higgins  VUY:[id number]  HUO:[id number]  DOB - 09/04/1970  Chief Complaint  Patient presents with  . Hospitalization Follow-up       Subjective:   Steven Higgins is a 46 y.o. male here today for establishment of care. He presented to the hospital via EMS on 03/15/2016 after episode of chest pain in a blackout. He was witnessed to have jerking, shaking movements and his eyes were rolling. When EMS arrived they believed that he was post ictal. By time he would arrived at the emergency department he was almost back to baseline. His potassium was 2.5. Glucose 1:30. Creatinine 1.5. Blood pressure 193/116. A CT of the head showed a small hemorrhage in the left parietal region. MRI showed a small acute hemorrhage. He was admitted by the neurology team. He was started on anti-lipids. He was started on antihypertensives. He was given potassium. He had speech, occupational and speech therapy.  Her renal ultrasound showed bilateral moderate nonobstructive disease. Carotid ultrasound showed mild nonobstructive disease. An echocardiogram shows left ventricular hypertrophy, an EF of 40-45%, and diffuse hypokinesia. His potassium at discharge was 3.5. Creatinine at discharge 1.2. HIV negative. Total cholesterol 217. LDL 151. A1c 6.1%. His blood pressure at discharge was 169/109.  He states that he's been compliant with his medications. He has not smoked and doesn't have the urge to do so. No chest pain. No shortness of breath. No seizure activity. No headaches. No fevers or chills.  ROS: GEN: denies fever or chills, denies change in weight Skin: denies lesions or rashes HEENT: denies headache, earache, epistaxis, sore throat, or neck pain LUNGS: denies SHOB, dyspnea, PND, orthopnea CV: denies CP or palpitations ABD: denies abd pain, N or V EXT: denies muscle spasms or swelling; no pain in lower ext, no weakness NEURO: denies numbness or tingling, denies sz, stroke or TIA  ALLERGIES: No Known  Allergies  PAST MEDICAL HISTORY: Past Medical History:  Diagnosis Date  . Hypertension     PAST SURGICAL HISTORY: No past surgical history on file.  MEDICATIONS AT HOME: Prior to Admission medications   Medication Sig Start Date End Date Taking? Authorizing Provider  amLODipine (NORVASC) 10 MG tablet Take 1 tablet (10 mg total) by mouth daily. 03/20/16  Yes Layne Benton, NP  atorvastatin (LIPITOR) 20 MG tablet Take 1 tablet (20 mg total) by mouth daily at 6 PM. 03/20/16  Yes Layne Benton, NP  labetalol (NORMODYNE) 200 MG tablet Take 1 tablet (200 mg total) by mouth 3 (three) times daily. 03/20/16  Yes Layne Benton, NP  levETIRAcetam (KEPPRA) 500 MG tablet Take 1 tablet (500 mg total) by mouth 2 (two) times daily. 03/20/16  Yes Layne Benton, NP  nicotine (NICODERM CQ - DOSED IN MG/24 HOURS) 14 mg/24hr patch Place 1 patch (14 mg total) onto the skin daily. 03/21/16  Yes Layne Benton, NP  lisinopril (PRINIVIL,ZESTRIL) 10 MG tablet Take 1 tablet (10 mg total) by mouth daily. 03/22/16   Vivianne Master, PA-C   Fam-HTN  SOCIAl-married has children in [place]; native of [place] just stopped smoking  Objective:   Vitals:   03/22/16 1514  BP: (!) 163/105  Pulse: 74  Temp: 98.2 F (36.8 C)  TempSrc: Oral  SpO2: 99%  Weight: 215 lb 12.8 oz (97.9 kg)    Exam General appearance : Awake, alert, not in any distress. Speech Clear. Not toxic looking HEENT: Atraumatic and Normocephalic, pupils equally reactive to light and accomodation Neck: supple,  no JVD. No cervical lymphadenopathy.  Chest:Good air entry bilaterally, no added sounds  CVS: S1 S2 regular, no murmurs.  Abdomen: Bowel sounds present, Non tender and not distended with no guarding, rigidity or rebound. Extremities: B/L Lower Ext shows no edema, both legs are warm to touch Neurology: Awake alert, and oriented X 3, CN II-XII intact, Non focal Skin:No Rash Wounds:N/A  Data Review Lab Results  Component Value  Date   HGBA1C 6.1 (H) 03/16/2016     Assessment & Plan  1. Intracerebral Hemmorhage  -RF modification  -Aggressive BP control  -Appt with neuro for f/u  2. Seizure likely 2/2 #1  -Prevention-Keppra  -no driving for now 3. HTN emergency-improved  -DASH diet  -Add ACE  -BMP today 4. Hypokalemia  -BMP today 5. CM EF 40-45%  -Cont BB, add ACE  -Cards referral 6. Smoker  -ready for cessation 7. HL  -cont statin  -low chol diet   Return in about 10 days (around 04/01/2016).  The patient was given clear instructions to go to ER or return to medical center if symptoms don't improve, worsen or new problems develop. The patient verbalized understanding. The patient was told to call to get lab results if they haven't heard anything in the next week.   Total time spent with patient was 29 min.Greater than 50 % of this visit was spent face to face counseling and coordinating care regarding risk factor modification, compliance importance and encouragement, education related to RF modification, meds and review of hospital course.  This note has been created with Education officer, environmental. Any transcriptional errors are unintentional.    Scot Jun, PA-C [facility] and [facility] [place], [place] [phone]   03/22/2016, 3:23 PM

## 2016-03-23 ENCOUNTER — Encounter: Payer: Self-pay | Admitting: Cardiology

## 2016-03-27 ENCOUNTER — Telehealth: Payer: Self-pay

## 2016-03-27 NOTE — Telephone Encounter (Signed)
Pt was called and there is no VM set up at this time.

## 2016-04-02 ENCOUNTER — Other Ambulatory Visit: Payer: Self-pay

## 2016-04-02 NOTE — Patient Outreach (Signed)
[facility]) Care Management  04/02/2016  Steven Higgins [birth year] [id number]  EMMI stroke program:  Day #9 ( stroke coping) REFERRAL DATE: 04/02/16  REFERRAL SOURCE: EMMI stroke red alert: STATUS post hospital  discharge 03/15/16 - 03/20/16 REFERRAL REASON: Lost interest in things they used to enjoy: YES,  Sad, hopeless, anxious, or empty: YES  PROVIDERS: Dr. Armanda Higgins Dr. Pearlean Higgins  SOCIAL: Patient lives alone. Patient has his own transportation. Per patient he has family that is supportive.   Telephone call to patient regarding EMMI stroke red alert.  HIPAA verified with patient. Discussed EMMI stroke program with patient. Patient states he has been feeling a little low. Patient states he thinks his medication is making him feel this way. Patient states he can tell his energy is increasing and his appetite is better. Patient states he is not use to staying home and wants to get back in the gym.  Patient reports he has a primary MD appointment on 04/04/16. New primary MD appointment set up for patient at discharge.  Patient confirms he has a neurology appointment with Dr. Pearlean Higgins on 05/10/16. Patient states he is able to provide his own transportation.  Patient states he has all of his medications. States he is currently able to afford them. Patient states he is taking his medications as prescribed. RNCM reviewed all medications with patient. Discussed medication usage and what each medication was for. Medication compliance counseling provided.  Patient states he does not have a blood pressure monitor at home but has checked his blood pressure at the store. Patient reports  blood pressure check las week was 179/109. Patient denies any symptoms of headache, vision disturbance or other symptoms. RNCM reviewed signs/ symptoms of stroke with patient. Advised patient to call 911 for stroke like symptoms.  Patient states overall he feels better now than he did before he went into the  hospital .  Patient states he is down to smoking 1-2 cigarettes per week and is not drinking any alcohol.  Patient states he is having trouble sleeping. States he wakes up during the night. Patient states he also has left sided neck pain at night. RNCM advised patient to report these symptoms to his doctor at upcoming appointment. Patient verbally agreed.  Patient states he is waiting to hear from the financial office from [place] regarding his bills.  RNCM informed patient she would send him EMMI education material on stroke, hypertension and seizure. Patient verbally agreed [facility] contacted [place] billing department for patient. Was informed if patient is Medicaid potential patient will need to complete Medicaid application process. If patient denied for Medicaid then patient can contact hospital billing office inform them he was denied and apply for financial assistance. RNCM contacted patient back and informed him of this. Patient verbalized understanding.   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  Mr.Steven [name] a 46 y.o.malewith history of hypertension, hypokalemia, previous infarcts by imaging, and ongoing tobacco usepresenting withnew onsetseizure.   PLAN; RNCM will close patient due to patient being assessed and having  no further needs. RNCM will send patient EMMI education material on stroke, hypertension, and seizures.   George Ina RN,BSN,CCM [facility] Telephonic  [phone]

## 2016-04-04 ENCOUNTER — Ambulatory Visit (INDEPENDENT_AMBULATORY_CARE_PROVIDER_SITE_OTHER): Payer: Self-pay | Admitting: Cardiology

## 2016-04-04 ENCOUNTER — Telehealth: Payer: Self-pay

## 2016-04-04 ENCOUNTER — Encounter: Payer: Self-pay | Admitting: Cardiology

## 2016-04-04 VITALS — BP 146/92 | HR 74 | Ht 78.0 in | Wt 212.0 lb

## 2016-04-04 DIAGNOSIS — I6529 Occlusion and stenosis of unspecified carotid artery: Secondary | ICD-10-CM

## 2016-04-04 DIAGNOSIS — I420 Dilated cardiomyopathy: Secondary | ICD-10-CM | POA: Insufficient documentation

## 2016-04-04 DIAGNOSIS — I701 Atherosclerosis of renal artery: Secondary | ICD-10-CM

## 2016-04-04 DIAGNOSIS — I161 Hypertensive emergency: Secondary | ICD-10-CM

## 2016-04-04 DIAGNOSIS — I132 Hypertensive heart and chronic kidney disease with heart failure and with stage 5 chronic kidney disease, or end stage renal disease: Secondary | ICD-10-CM | POA: Insufficient documentation

## 2016-04-04 DIAGNOSIS — R079 Chest pain, unspecified: Secondary | ICD-10-CM

## 2016-04-04 DIAGNOSIS — I6523 Occlusion and stenosis of bilateral carotid arteries: Secondary | ICD-10-CM

## 2016-04-04 DIAGNOSIS — N186 End stage renal disease: Secondary | ICD-10-CM

## 2016-04-04 DIAGNOSIS — G40909 Epilepsy, unspecified, not intractable, without status epilepticus: Secondary | ICD-10-CM | POA: Insufficient documentation

## 2016-04-04 DIAGNOSIS — E785 Hyperlipidemia, unspecified: Secondary | ICD-10-CM | POA: Insufficient documentation

## 2016-04-04 HISTORY — DX: Occlusion and stenosis of unspecified carotid artery: I65.29

## 2016-04-04 HISTORY — DX: Atherosclerosis of renal artery: I70.1

## 2016-04-04 MED ORDER — CHLORTHALIDONE 25 MG PO TABS: 25.0000 mg | ORAL_TABLET | Freq: Every day | ORAL | 3 refills | Status: DC

## 2016-04-04 MED FILL — CHLORTHALIDONE 25 MG TABLET: 25 | 90 days supply | Qty: 90 | Fill #0

## 2016-04-04 NOTE — Patient Instructions (Signed)
Medication Instructions:  Your physician has recommended you make the following change in your medication:  1) START Chlorthalidone 25 mg tablet by mouth ONCE daily   Labwork: none  Testing/Procedures: Your physician has requested that you have a lexiscan myoview. For further information please visit [email or web address] Please follow instruction sheet, as given.    Follow-Up: Your physician recommends that you schedule a follow-up appointment in: Blood Pressure Clinic - within a week   Follow up with Dr. Mayford Knife as needed.     Any Other Special Instructions Will Be Listed Below (If Applicable).     If you need a refill on your cardiac medications before your next appointment, please call your pharmacy.

## 2016-04-04 NOTE — Addendum Note (Signed)
Addended by: Theressa Stamps on: 04/04/2016 03:19 PM   Modules accepted: Orders

## 2016-04-04 NOTE — Progress Notes (Signed)
Cardiology Office Note    Date:  04/04/2016   ID:  Steven Higgins, Steven Higgins 01-27-1970, MRN [id number]  PCP:  Scot Jun PA Cardiologist:  Armanda Magic, MD   Chief Complaint  Patient presents with  . New Evaluation    chest pain, HTN and cardiomyopathy    History of Present Illness:  Steven Higgins is a 46 y.o. male with a history of HTN who recently was in the hospital after a syncopal.  He was witnessed to have jerking and shaking movements with his eyes rolling back in his head.  In ER he was noted to be post ictal and potassium was 2.5 and BP 193/134mmHg.  A head CT showed a small hemorrhage in the left parietal lobe secondary to hypertensive microvascular disease.  Carotid dopplers showed mild 1-39% nonobstructive disease and echo showed EF 40-45%. Renal dopplers showed 1-59% renal artery stenosis bilaterally.   LDL was 151.  He was started on Keppra for seizures. He is now referred by Scot Jun PA for evaluation of mild LV dysfunction.  He says that he will have chest pain from time to time.  He was reported to have CP prior to his syncope but he says that actually it was upper abdominal pain and he went to the bathroom and had diarrhea and then passed out. He says that he used to have CP when he would smoke a cigarette but that has stopped after he stopped smoking when discharged from the hospital.  He denies any SOB, LE edema, dizziness or palpitations.      Past Medical History:  Diagnosis Date  . Carotid artery stenosis 04/04/2016   1-39% bilateral by dopplers 03/2016  . DCM (dilated cardiomyopathy) (HCC)    EF 40-45% by echo 03/2016 presumed due to HTN  . Hyperlipidemia LDL goal <70   . Hypertensive heart disease with CHF (HCC)   . 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 Renal artery stenosis (HCC) 04/04/2016   By renal dopplers 03/2016 1-59%  . Seizure disorder (HCC)    as sequela of small intracerebral bleed    History reviewed. No pertinent surgical  history.  Current Medications: Current Meds  Medication Sig  . amLODipine (NORVASC) 10 MG tablet Take 1 tablet (10 mg total) by mouth daily.  Marland Kitchen atorvastatin (LIPITOR) 20 MG tablet Take 1 tablet (20 mg total) by mouth daily at 6 PM.  . labetalol (NORMODYNE) 200 MG tablet Take 1 tablet (200 mg total) by mouth 3 (three) times daily.  Marland Kitchen levETIRAcetam (KEPPRA) 500 MG tablet Take 1 tablet (500 mg total) by mouth 2 (two) times daily.  Marland Kitchen lisinopril (PRINIVIL,ZESTRIL) 10 MG tablet Take 1 tablet (10 mg total) by mouth daily.  . nicotine (NICODERM CQ - DOSED IN MG/24 HOURS) 14 mg/24hr patch Place 1 patch (14 mg total) onto the skin daily.    Allergies:   Patient has no known allergies.   Social History   Social History  . Marital status: Single    Spouse name: N/A  . Number of children: N/A  . Years of education: N/A   Social History Main Topics  . Smoking status: Former Smoker    Packs/day: 0.25    Types: Cigarettes  . Smokeless tobacco: Current User  . Alcohol use Yes     Comment: occasional  . Drug use: No  . Sexual activity: Not Asked   Other Topics Concern  . None   Social History Narrative  .  None     Family History:  The patient's family history includes Diabetes in his mother; Heart attack in his father; Heart disease in his father and mother.   ROS:   Please see the history of present illness.    ROS All other systems reviewed and are negative.  No flowsheet data found.     PHYSICAL EXAM:   VS:  BP (!) 146/92   Pulse 74   Ht 6\' 6"  (1.981 m)   Wt 212 lb (96.2 kg)   SpO2 96%   BMI 24.50 kg/m    GEN: Well nourished, well developed, in no acute distress  HEENT: normal  Neck: no JVD, carotid bruits, or masses Cardiac: RRR; no murmurs, rubs, or gallops,no edema.  Intact distal pulses bilaterally.  Respiratory:  clear to auscultation bilaterally, normal work of breathing GI: soft, nontender, nondistended, + BS MS: no deformity or atrophy  Skin: warm and dry,  no rash Neuro:  Alert and Oriented x 3, Strength and sensation are intact Psych: euthymic mood, full affect  Wt Readings from Last 3 Encounters:  04/04/16 212 lb (96.2 kg)  03/22/16 215 lb 12.8 oz (97.9 kg)  03/16/16 212 lb 6.4 oz (96.3 kg)      Studies/Labs Reviewed:   EKG:  EKG is not ordered today.    Recent Labs: 03/15/2016: ALT 36 03/16/2016: TSH 0.568 03/18/2016: Hemoglobin 13.6; Magnesium 1.7; Platelets 186 03/22/2016: BUN 13; Creat 1.07; Potassium 4.0; Sodium 141   Lipid Panel    Component Value Date/Time   CHOL 217 (H) 03/16/2016 0705   TRIG 79 03/16/2016 0705   HDL 50 03/16/2016 0705   CHOLHDL 4.3 03/16/2016 0705   VLDL 16 03/16/2016 0705   LDLCALC 151 (H) 03/16/2016 0705    Additional studies/ records that were reviewed today include:  Hospital recordds    ASSESSMENT:    1. Chest pain, unspecified type   2. 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 Bilateral carotid artery stenosis   4. Renal artery stenosis (HCC)   5. DCM (dilated cardiomyopathy) (HCC)   6. Hyperlipidemia LDL goal <70      PLAN:  In order of problems listed above:  1. Chest pain - atpyical in nature and actually seems to start in his upper abdomen and is associated with diarrhea.  He does have CRFs including poorly controlled HTN, hyperlipidemia, tobacco use (he quit last month) and family history of heart disease.  His EKG is nonischemic. I suspect that CP is more due to abdominal issues and possibly from poorly controlled HTN.  I will get a Lexiscan myoview to rule out ischemia.  2. HTN - remains poorly controlled. He will continue on amlodipine, Labetolol and ACE I and I will add Chlorthalidone.  He will followup in our HTN clinic in 1 week.  Of note his 24 hour urine showed elevated normetanephrine level and increased aldo/renin ratio so will refer to Endocrine for further evaluation.    3. Bilateral carotid artery stenosis 1-39%  - he will continue on statin.  Not on ASA due to recent cerebral bleed.  Will leave decision on when it is safe to start to PCP.  LDL goal is < 70.  PCP will continue to follow.    4. Renal artery stenosis 1-59% by renal dopplers.  Unlikely to be causing poor BP control at this time but this needs to be followed closely by the PCP.  5. DCM EF  40-45% by echo. Likely related to hypertensive CM.  He needs aggressive BP control.  Will continue on ACE I and BB and start diuretic.   6. Hyperlipidemia with LDL goal < 70.  Continue on statin.  Followed by PCP.   I have spent a total of 45 minutes with patient reviewing extensive hospital records including head MRI, echo, labs , telemetry, EKGs, labs and examining patient as well as establishing an assessment and plan that was discussed with the patient.  > 50% of time was spent in direct patient care.    I will see him back in 6 months.  Medication Adjustments/Labs and Tests Ordered: Current medicines are reviewed at length with the patient today.  Concerns regarding medicines are outlined above.  Medication changes, Labs and Tests ordered today are listed in the Patient Instructions below.  Patient Instructions  Medication Instructions:  Your physician has recommended you make the following change in your medication:  1) START Chlorthalidone 25 mg tablet by mouth ONCE daily   Labwork: none  Testing/Procedures: Your physician has requested that you have a lexiscan myoview. For further information please visit [email or web address] Please follow instruction sheet, as given.    Follow-Up: Your physician recommends that you schedule a follow-up appointment in: Blood Pressure Clinic - within a week   Follow up with Dr. Mayford Knife as needed.     Any Other Special Instructions Will Be Listed Below (If Applicable).     If you need a refill on your cardiac medications before your next appointment, please call your  pharmacy.      Signed, Armanda Magic, MD  04/04/2016 11:33 AM    [facility] [address] [place], [place], [place]  [address] Phone: [phone]; Fax: [phone]

## 2016-04-04 NOTE — Telephone Encounter (Signed)
MD, Mayford Knife, would like pt to have a referral to Endocrinology for Hypertensive Emergency and elevated Urinary Catacholamines.  Nurse tried to call pt but there was no answer and no VM setup.  Referral sent to Endocrinology just wanted to make pt aware to call our office if he needs referral to PCP as he is uninsured and specialist may be to expensive at this time.

## 2016-04-05 ENCOUNTER — Ambulatory Visit: Payer: Self-pay | Admitting: Cardiology

## 2016-04-05 NOTE — Telephone Encounter (Signed)
Patient has been scheduled 4/9 with Endocrinology.

## 2016-04-09 ENCOUNTER — Other Ambulatory Visit: Payer: Self-pay

## 2016-04-09 ENCOUNTER — Encounter: Payer: Self-pay | Admitting: Endocrinology

## 2016-04-09 ENCOUNTER — Ambulatory Visit (INDEPENDENT_AMBULATORY_CARE_PROVIDER_SITE_OTHER): Payer: Self-pay | Admitting: Endocrinology

## 2016-04-09 VITALS — BP 130/78 | HR 63 | Ht 78.0 in | Wt 210.0 lb

## 2016-04-09 DIAGNOSIS — E876 Hypokalemia: Secondary | ICD-10-CM

## 2016-04-09 DIAGNOSIS — I10 Essential (primary) hypertension: Secondary | ICD-10-CM

## 2016-04-09 LAB — POTASSIUM: Potassium: 2.7 mEq/L — CL (ref 3.5–5.1)

## 2016-04-09 MED ORDER — POTASSIUM CHLORIDE CRYS ER 20 MEQ PO TBCR: 20.0000 meq | EXTENDED_RELEASE_TABLET | Freq: Two times a day (BID) | ORAL | 3 refills | Status: DC

## 2016-04-09 MED ORDER — DOXAZOSIN MESYLATE 4 MG PO TABS: 4.0000 mg | ORAL_TABLET | Freq: Every day | ORAL | 1 refills | Status: DC

## 2016-04-09 MED FILL — DOXAZOSIN MESYLATE 4 MG TAB: 4 | 30 days supply | Qty: 30 | Fill #0

## 2016-04-09 NOTE — Patient Instructions (Signed)
STOP the following medications:  Chlorthalidone Lisinopril  New blood pressure medicine: DOXAZOSIN  Take this in the evening, taking only half of the tablet the first time and then go to full tablets daily if not getting dizzy from this  Continue to have high potassium foods like oranges, bananas, potatoes and yogurt  I will call you with the results of the urine test for potassium  Follow-up in 2 weeks to review her blood pressure and need for further testing for the salt hormone from the ADRENAL gland

## 2016-04-09 NOTE — Progress Notes (Signed)
Patient ID: Steven Higgins, male   DOB: 1970-09-29, 46 y.o.   MRN: [id number]             Referring physician: [facility]  Chief complaint: High blood pressure  History of Present Illness:  His hypertension was diagnosed about 10 or 11 years ago and details of his diagnosis and treatment are unknown He had been treated for this for several years but about 2 years ago he decided to stop his medications on his own Disorder especially because he was getting cough from lisinopril. He did not have any follow-up for his hypertension even though he thinks his blood pressure was running around 180  He was admitted to the hospital in 03/2016 with than intracerebral hemorrhage and his blood pressure was markedly increased at that time.  He was treated aggressively and blood pressure apparently improved with regimen of amlodipine, labetalol and lisinopril  He was also found to have persistent HYPOKALEMIA in the hospital with potassium levels as low as 2.5 and was treated with supplements for this.  Magnesium level was low normal at 1.7  Past Medical History:  Diagnosis Date  . Carotid artery stenosis 04/04/2016   1-39% bilateral by dopplers 03/2016  . DCM (dilated cardiomyopathy) (HCC)    EF 40-45% by echo 03/2016 presumed due to HTN  . Hyperlipidemia LDL goal <70   . Hypertensive heart disease with CHF (HCC)   . 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 Renal artery stenosis (HCC) 04/04/2016   By renal dopplers 03/2016 1-59%  . Seizure disorder (HCC)    as sequela of small intracerebral bleed    No past surgical history on file.  Family History  Problem Relation Age of Onset  . Diabetes Mother   . Heart disease Mother   . Heart disease Father   . Heart attack Father     Social History:  reports that he has quit smoking. His smoking use included Cigarettes. He smoked 0.25 packs per day. He uses smokeless tobacco. He reports that he drinks alcohol. He reports that he does  not use drugs.  Allergies: No Known Allergies  Allergies as of 04/09/2016   No Known Allergies     Medication List       Accurate as of 04/09/16  2:29 PM. Always use your most recent med list.          amLODipine 10 MG tablet Commonly known as:  NORVASC Take 1 tablet (10 mg total) by mouth daily.   atorvastatin 20 MG tablet Commonly known as:  LIPITOR Take 1 tablet (20 mg total) by mouth daily at 6 PM.   chlorthalidone 25 MG tablet Commonly known as:  HYGROTON Take 1 tablet (25 mg total) by mouth daily.   labetalol 200 MG tablet Commonly known as:  NORMODYNE Take 1 tablet (200 mg total) by mouth 3 (three) times daily.   levETIRAcetam 500 MG tablet Commonly known as:  KEPPRA Take 1 tablet (500 mg total) by mouth 2 (two) times daily.   lisinopril 10 MG tablet Commonly known as:  PRINIVIL,ZESTRIL Take 1 tablet (10 mg total) by mouth daily.   nicotine 14 mg/24hr patch Commonly known as:  NICODERM CQ - dosed in mg/24 hours Place 1 patch (14 mg total) onto the skin daily.       LABS:  No visits with results within 1 Week(s) from this visit.  Latest known visit with results is:  Office Visit on 03/22/2016  Component Date Value Ref Range Status  . Sodium 03/22/2016 141  135 - 146 mmol/L Final  . Potassium 03/22/2016 4.0  3.5 - 5.3 mmol/L Final  . Chloride 03/22/2016 100  98 - 110 mmol/L Final  . CO2 03/22/2016 31  20 - 31 mmol/L Final  . Glucose, Bld 03/22/2016 84  65 - 99 mg/dL Final  . BUN 59/74/1638 13  7 - 25 mg/dL Final  . Creat 45/36/4680 1.07  0.60 - 1.35 mg/dL Final  . Calcium 32/12/2480 9.4  8.6 - 10.3 mg/dL Final        Review of Systems  Constitutional: Negative for weight loss and diaphoresis.  HENT: Negative for headaches.   Respiratory: Positive for cough and daytime sleepiness.   Cardiovascular: Negative for palpitations.       Has only occasional feeling of palpitations, not new  Gastrointestinal: Negative for abdominal pain.  Endocrine:  Negative for light-headedness.  Skin:       No history of flushing or excessive sweating     PHYSICAL EXAM:  BP 130/78   Pulse 63   Ht 6\' 6"  (1.981 m)   Wt 210 lb (95.3 kg)   BMI 24.27 kg/m   GENERAL:  Very tall and lanky build   No pallor, clubbing, lymphadenopathy or edema.   Skin:  no rash or pigmentation.  EYES:  Externally normal.  Fundii: Not clearly seen, does not have obvious blood vessel changes  ENT: Oral mucosa and tongue normal.  THYROID:  Not palpable.  HEART:  Normal  S1 and S2; no murmur or click.  CHEST:  Normal shape.  Lungs: Vescicular breath sounds heard equally.  No crepitations/ wheeze.  ABDOMEN:  No distention.  Liver and spleen not palpable.  No other mass or tenderness.  NEUROLOGICAL: .Reflexes are bilaterally normal at biceps  JOINTS:  Normal.   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 His blood pressure recently appears to be improved, was significantly high during hospitalization for cerebral hemorrhage  Potassium level has been consistently low without any diuretics in the past and is highly suggestive of hyperaldosteronism However his morning ALDOSTERONE level was only 9.9 The ratio of aldosterone/renin is falsely high because of the very low renin level associated with his ethnic status and not diagnostic of hyperaldosteronism  Also although he had a slightly higher plasma metanephrine his fractionated catecholamine levels are quite normal most likely ruling out pheochromocytoma, also does not have any symptoms suggestive of this    PLAN:    24-hour urine potassium to evaluate excess urinary loss  Patient will need to Stop lisinopril because of cough  Also unlikely to be able to tolerate chlorthalidone without severe hypokalemia and will stop this for now, this will also interfere with his evaluation of hyperaldosteronism and urinary potassium  He will start Doxazosin 4 mg  daily  Start monitoring blood pressure at home  Although he may be potentially a candidate for surgical intervention if adrenal adenoma found he may also do well with empirically trying Aldactone    Consultation note sent to the referring physician  [facility] 04/09/2016, 2:29 PM    ADDENDUM: Potassium is 2.7: Patient will stop chlorthalidone as instructed and start potassium chloride 20 mEq twice a day Further studies to be done when potassium is repleted

## 2016-04-13 ENCOUNTER — Other Ambulatory Visit: Payer: Self-pay

## 2016-04-13 ENCOUNTER — Telehealth: Payer: Self-pay | Admitting: Endocrinology

## 2016-04-13 MED ORDER — DOXAZOSIN MESYLATE 4 MG PO TABS: 4.0000 mg | ORAL_TABLET | Freq: Every day | ORAL | 1 refills | Status: DC

## 2016-04-13 MED FILL — LABETALOL HCL 200 MG TABLET: 200 | 30 days supply | Qty: 90 | Fill #1

## 2016-04-13 MED FILL — levETIRAcetam 500 MG TABS: 500 | 30 days supply | Qty: 60 | Fill #1

## 2016-04-13 MED FILL — AMLODIPINE BESYLATE 10 MG T: 10 | 30 days supply | Qty: 30 | Fill #1

## 2016-04-13 MED FILL — ATORVASTATIN 20 MG TABLET: 20 | 30 days supply | Qty: 30 | Fill #1

## 2016-04-13 NOTE — Telephone Encounter (Signed)
Refill of  doxazosin (CARDURA) 4 MG tablet 30 tablet   [facility] - [place], [place] - [address]. [phone] (Phone) [phone] (Fax)

## 2016-04-13 NOTE — Telephone Encounter (Signed)
Ordered

## 2016-04-13 NOTE — Addendum Note (Signed)
Addended by: Adline Mango I on: 04/13/2016 08:50 AM   Modules accepted: Orders

## 2016-04-14 LAB — POTASSIUM, URINE, 24 HOUR
POTASSIUM, URINE: 56.7 mmol/(24.h) (ref 25.0–125.0)
Potassium Urine: 18.9 mmol/L

## 2016-04-14 LAB — CREATININE, URINE, 24 HOUR
Creatinine, 24H Ur: 1644 mg/24 hr (ref 1000–2000)
Creatinine, Urine: 54.8 mg/dL

## 2016-04-16 ENCOUNTER — Telehealth: Payer: Self-pay | Admitting: Endocrinology

## 2016-04-16 NOTE — Telephone Encounter (Signed)
Please advise 

## 2016-04-16 NOTE — Telephone Encounter (Signed)
Okay to send a note for work

## 2016-04-16 NOTE — Telephone Encounter (Signed)
Pt's employer called in and said that the Pt has been out of work for a couple of weeks due to health related issues, he said that that Pt is saying he is ready to come back to work, and was told that we were treating him, they are requesting and note or order of some form saying that he can come back to work, or if there are any restrictions or anything. I told him I would have Dr. Lucianne Muss advise this matter. Louanna Raw (caller) CB # [phone]

## 2016-04-17 ENCOUNTER — Telehealth (HOSPITAL_COMMUNITY): Payer: Self-pay | Admitting: *Deleted

## 2016-04-17 NOTE — Telephone Encounter (Signed)
Patient given detailed instructions per Myocardial Perfusion Study Information Sheet for the test on 04/20/16 at 0930. Patient notified to arrive 15 minutes early and that it is imperative to arrive on time for appointment to keep from having the test rescheduled.  If you need to cancel or reschedule your appointment, please call the office within 24 hours of your appointment. Failure to do so may result in a cancellation of your appointment, and a $50 no show fee. Patient verbalized understanding.Richanda Darin, Adelene Idler

## 2016-04-20 ENCOUNTER — Ambulatory Visit: Payer: Self-pay

## 2016-04-20 ENCOUNTER — Ambulatory Visit (HOSPITAL_COMMUNITY): Payer: Self-pay | Attending: Cardiology

## 2016-04-20 DIAGNOSIS — R079 Chest pain, unspecified: Secondary | ICD-10-CM | POA: Insufficient documentation

## 2016-04-20 DIAGNOSIS — R569 Unspecified convulsions: Secondary | ICD-10-CM | POA: Insufficient documentation

## 2016-04-20 DIAGNOSIS — I10 Essential (primary) hypertension: Secondary | ICD-10-CM | POA: Insufficient documentation

## 2016-04-20 LAB — MYOCARDIAL PERFUSION IMAGING
CHL CUP NUCLEAR SDS: 4
CHL CUP NUCLEAR SRS: 2
CHL CUP NUCLEAR SSS: 6
LHR: 0.24
LV dias vol: 250 mL (ref 62–150)
LV sys vol: 163 mL
Peak HR: 81 {beats}/min
Rest HR: 58 {beats}/min
TID: 1.04

## 2016-04-20 MED ORDER — TECHNETIUM TC 99M TETROFOSMIN IV KIT
31.9000 | PACK | Freq: Once | INTRAVENOUS | Status: AC | PRN
  Administered 2016-04-20: 31.9 via INTRAVENOUS
  Filled 2016-04-20: qty 32

## 2016-04-20 MED ORDER — REGADENOSON 0.4 MG/5ML IV SOLN
0.4000 mg | Freq: Once | INTRAVENOUS | Status: AC
  Administered 2016-04-20: 0.4 mg via INTRAVENOUS

## 2016-04-20 MED ORDER — TECHNETIUM TC 99M TETROFOSMIN IV KIT
10.2000 | PACK | Freq: Once | INTRAVENOUS | Status: AC | PRN
  Administered 2016-04-20: 10.2 via INTRAVENOUS
  Filled 2016-04-20: qty 11

## 2016-04-20 NOTE — Progress Notes (Deleted)
Patient ID: Steven Higgins                 DOB: 1970/10/27                      MRN: [id number]     HPI: Steven Higgins is a 46 y.o. male patient of Dr. Mayford Knife who presents today for hypertension evaluation.  PMH includes HTN. He was recently was in the hospital after a syncopal.  He was witnessed to have jerking and shaking movements with his eyes rolling back in his head.  In ER he was noted to be post ictal and potassium was 2.5 and BP 193/15mmHg.  A head CT showed a small hemorrhage in the left parietal lobe secondary to hypertensive microvascular disease.  Carotid dopplers showed mild 1-39% nonobstructive disease and echo showed EF 40-45%. Renal dopplers showed 1-59% renal artery stenosis bilaterally. At his most recent OV with Dr. Mayford Knife he was started on chlorthalidone.   He presents today     Current HTN meds:  Lisinopril 10mg  daily  Labetalol 200mg  TID Doxazosin 4mg  Qpm Chlorthalidone 25mg  daily  Amlodipine 10mg  daily   Previously tried:   BP goal: <130/80  Family History: The patient's family history includes Diabetes in his mother; Heart attack in his father; Heart disease in his father and mother  Social History: Former smoker, currently uses smokeless tobacco, occasional alcohol  Diet:   Exercise:   Home BP readings:   Wt Readings from Last 3 Encounters:  04/09/16 210 lb (95.3 kg)  04/04/16 212 lb (96.2 kg)  03/22/16 215 lb 12.8 oz (97.9 kg)   BP Readings from Last 3 Encounters:  04/09/16 130/78  04/04/16 (!) 146/92  03/22/16 (!) 163/105   Pulse Readings from Last 3 Encounters:  04/09/16 63  04/04/16 74  03/22/16 74    Renal function: CrCl cannot be calculated (Patient's most recent lab result is older than the maximum 21 days allowed.).  Past Medical History:  Diagnosis Date  . Carotid artery stenosis 04/04/2016   1-39% bilateral by dopplers 03/2016  . DCM (dilated cardiomyopathy) (HCC)    EF 40-45% by echo 03/2016 presumed due to HTN  . Hyperlipidemia  LDL goal <70   . Hypertensive heart disease with CHF (HCC)   . 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 Renal artery stenosis (HCC) 04/04/2016   By renal dopplers 03/2016 1-59%  . Seizure disorder [facility])    as sequela of small intracerebral bleed    Current Outpatient Prescriptions on File Prior to Visit  Medication Sig Dispense Refill  . amLODipine (NORVASC) 10 MG tablet Take 1 tablet (10 mg total) by mouth daily. 30 tablet 2  . atorvastatin (LIPITOR) 20 MG tablet Take 1 tablet (20 mg total) by mouth daily at 6 PM. 30 tablet 2  . chlorthalidone (HYGROTON) 25 MG tablet Take 1 tablet (25 mg total) by mouth daily. 90 tablet 3  . doxazosin (CARDURA) 4 MG tablet Take 1 tablet (4 mg total) by mouth at bedtime. 30 tablet 1  . labetalol (NORMODYNE) 200 MG tablet Take 1 tablet (200 mg total) by mouth 3 (three) times daily. 90 tablet 2  . levETIRAcetam (KEPPRA) 500 MG tablet Take 1 tablet (500 mg total) by mouth 2 (two) times daily. 60 tablet 2  . lisinopril (PRINIVIL,ZESTRIL) 10 MG tablet Take 1 tablet (10 mg total) by mouth daily. 90 tablet 3  . nicotine (NICODERM CQ - DOSED IN  MG/24 HOURS) 14 mg/24hr patch Place 1 patch (14 mg total) onto the skin daily. 28 patch 0  . potassium chloride SA (K-DUR,KLOR-CON) 20 MEQ tablet Take 1 tablet (20 mEq total) by mouth 2 (two) times daily. 60 tablet 3   No current facility-administered medications on file prior to visit.     No Known Allergies  There were no vitals taken for this visit.   Assessment/Plan: Hypertension:    Thank you, Freddie Apley. Cleatis Polka, PharmD  [facility]  04/20/2016 7:40 AM

## 2016-04-24 ENCOUNTER — Ambulatory Visit (INDEPENDENT_AMBULATORY_CARE_PROVIDER_SITE_OTHER): Payer: Self-pay | Admitting: Pharmacist

## 2016-04-24 VITALS — BP 138/92 | HR 69

## 2016-04-24 DIAGNOSIS — I132 Hypertensive heart and chronic kidney disease with heart failure and with stage 5 chronic kidney disease, or end stage renal disease: Secondary | ICD-10-CM

## 2016-04-24 DIAGNOSIS — N186 End stage renal disease: Secondary | ICD-10-CM

## 2016-04-24 LAB — BASIC METABOLIC PANEL
BUN/Creatinine Ratio: 7 — ABNORMAL LOW (ref 9–20)
BUN: 8 mg/dL (ref 6–24)
CO2: 30 mmol/L — AB (ref 18–29)
Calcium: 9.9 mg/dL (ref 8.7–10.2)
Chloride: 96 mmol/L (ref 96–106)
Creatinine, Ser: 1.09 mg/dL (ref 0.76–1.27)
GFR, EST AFRICAN AMERICAN: 96 mL/min/{1.73_m2} (ref >59)
GFR, EST NON AFRICAN AMERICAN: 83 mL/min/{1.73_m2} (ref >59)
Glucose: 157 mg/dL — ABNORMAL HIGH (ref 65–99)
Potassium: 3.1 mmol/L — ABNORMAL LOW (ref 3.5–5.2)
Sodium: 144 mmol/L (ref 134–144)

## 2016-04-24 MED ORDER — VALSARTAN 160 MG PO TABS: 160.0000 mg | ORAL_TABLET | Freq: Every day | ORAL | 3 refills | Status: DC

## 2016-04-24 MED FILL — VALSARTAN 160 MG TABLET: 160 | 30 days supply | Qty: 30 | Fill #0

## 2016-04-24 NOTE — Progress Notes (Signed)
Patient ID: Steven Higgins                 DOB: [birth year]                      MRN: [id number]     HPI: Steven Higgins is a 46 y.o. male patient of Dr. Mayford Knife who presents today for hypertension evaluation.  PMH includes HTN. He was recently was in the hospital after a syncopal.  He was witnessed to have jerking and shaking movements with his eyes rolling back in his head.  In ER he was noted to be post ictal and potassium was 2.5 and BP 193/175mmHg.  A head CT showed a small hemorrhage in the left parietal lobe secondary to hypertensive microvascular disease.  Carotid dopplers showed mild 1-39% nonobstructive disease and echo showed EF 40-45%. Renal dopplers showed 1-59% renal artery stenosis bilaterally. At his most recent OV with Dr. Mayford Knife he was started on chlorthalidone.   He presents today and states he has been doing well since his last visit with Dr. Mayford Knife. He reports that he takes his medications as listed on the sheet provided from front desk. After some conversation it is determined that he believes lisinopril was stopped due to cough.   He reports no other issues with his medications. He denies dizziness or lethargy.    Current HTN meds:  Labetalol 200mg  TID Doxazosin 4mg  Qpm Chlorthalidone 25mg  daily  Amlodipine 10mg  daily   Previously tried: cough with carvedilol and metoprolol. Lisinopril stopped due to cough?  BP goal: <130/80  Family History: The patient's family history includes Diabetes in his mother; Heart attack in his father; Heart disease in his father and mother  Social History: Former smoker, currently uses smokeless tobacco, occasional alcohol  Diet: Most meals from home, uses seasonings but tries to avoid salts. No coffee and has decreased tea.   Exercise: Waiting to exercise for test results.   Home BP readings: no machine at home.   Wt Readings from Last 3 Encounters:  04/20/16 212 lb (96.2 kg)  04/09/16 210 lb (95.3 kg)  04/04/16 212 lb (96.2 kg)    BP Readings from Last 3 Encounters:  04/24/16 (!) 138/92  04/09/16 130/78  04/04/16 (!) 146/92   Pulse Readings from Last 3 Encounters:  04/24/16 69  04/09/16 63  04/04/16 74    Renal function: CrCl cannot be calculated (Patient's most recent lab result is older than the maximum 21 days allowed.).  Past Medical History:  Diagnosis Date  . Carotid artery stenosis 04/04/2016   1-39% bilateral by dopplers 03/2016  . DCM (dilated cardiomyopathy) (HCC)    EF 40-45% by echo 03/2016 presumed due to HTN  . Hyperlipidemia LDL goal <70   . Hypertensive heart disease with CHF (HCC)   . 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 Renal artery stenosis (HCC) 04/04/2016   By renal dopplers 03/2016 1-59%  . Seizure disorder [facility])    as sequela of small intracerebral bleed    Current Outpatient Prescriptions on File Prior to Visit  Medication Sig Dispense Refill  . amLODipine (NORVASC) 10 MG tablet Take 1 tablet (10 mg total) by mouth daily. 30 tablet 2  . atorvastatin (LIPITOR) 20 MG tablet Take 1 tablet (20 mg total) by mouth daily at 6 PM. 30 tablet 2  . chlorthalidone (HYGROTON) 25 MG tablet Take 1 tablet (25 mg total) by mouth daily. 90 tablet 3  .  doxazosin (CARDURA) 4 MG tablet Take 1 tablet (4 mg total) by mouth at bedtime. 30 tablet 1  . labetalol (NORMODYNE) 200 MG tablet Take 1 tablet (200 mg total) by mouth 3 (three) times daily. 90 tablet 2  . levETIRAcetam (KEPPRA) 500 MG tablet Take 1 tablet (500 mg total) by mouth 2 (two) times daily. 60 tablet 2  . nicotine (NICODERM CQ - DOSED IN MG/24 HOURS) 14 mg/24hr patch Place 1 patch (14 mg total) onto the skin daily. 28 patch 0  . potassium chloride SA (K-DUR,KLOR-CON) 20 MEQ tablet Take 1 tablet (20 mEq total) by mouth 2 (two) times daily. 60 tablet 3   No current facility-administered medications on file prior to visit.     No Known Allergies  Blood pressure (!) 138/92, pulse 69.   Assessment/Plan: Hypertension: BP  today remains above goal <130/80. Will have pt remain off lisinopril as he believes this is associated with cough. Will start valsartan 160mg  daily for added blood pressure control. May need to change his labetalol to carvedilol for better blood pressure control in the future - he reported that it was associated with cough, but then that it was the lisinopril (thus I think we could rechallenge with carvedilol if needed since this medication tends to do better for pressures and adherence). Follow up in HTN clinic in 4 weeks for further medication titration.    Thank you, Freddie Apley. Cleatis Polka, PharmD  [facility]  04/24/2016 10:46 AM

## 2016-04-24 NOTE — Telephone Encounter (Signed)
Note is at the front desk for pick up

## 2016-04-24 NOTE — Patient Instructions (Signed)
Return for a follow up appointment in 4 weeks  Check your blood pressure at home daily (if able) and keep record of the readings.  Take your BP meds as follows: STOP lisinopril  START taking valsartan 160mg  daily  Continue all other meds as prescribed  Bring all of your meds, your BP cuff and your record of home blood pressures to your next appointment.  Exercise as you're able, try to walk approximately 30 minutes per day.  Keep salt intake to a minimum, especially watch canned and prepared boxed foods.  Eat more fresh fruits and vegetables and fewer canned items.  Avoid eating in fast food restaurants.    HOW TO TAKE YOUR BLOOD PRESSURE: . Rest 5 minutes before taking your blood pressure. .  Don't smoke or drink caffeinated beverages for at least 30 minutes before. . Take your blood pressure before (not after) you eat. . Sit comfortably with your back supported and both feet on the floor (don't cross your legs). . Elevate your arm to heart level on a table or a desk. . Use the proper sized cuff. It should fit smoothly and snugly around your bare upper arm. There should be enough room to slip a fingertip under the cuff. The bottom edge of the cuff should be 1 inch above the crease of the elbow. . Ideally, take 3 measurements at one sitting and record the average.

## 2016-04-25 ENCOUNTER — Encounter: Payer: Self-pay | Admitting: Pharmacist

## 2016-05-01 ENCOUNTER — Ambulatory Visit (INDEPENDENT_AMBULATORY_CARE_PROVIDER_SITE_OTHER): Payer: Self-pay | Admitting: Endocrinology

## 2016-05-01 ENCOUNTER — Encounter: Payer: Self-pay | Admitting: Endocrinology

## 2016-05-01 VITALS — BP 146/100 | HR 82 | Ht 78.0 in | Wt 215.0 lb

## 2016-05-01 DIAGNOSIS — I10 Essential (primary) hypertension: Secondary | ICD-10-CM

## 2016-05-01 NOTE — Patient Instructions (Signed)
Blood pressure medication changes:  STOP valsartan  Increase doxazosin 2 tablets daily  Restart LABETALOL twice a day  POTASSIUM: Increase this to 3 tablets daily   In 2 weeks you will be doing start loading test As Follows:  Need to Get SALT tablets from any pharmacy which you will be taking 2 tablets with every meal along with increasing disorganized intake in her diet as much as possible for 3 straight days  On the third day you will collect urine for 24 hours

## 2016-05-01 NOTE — Progress Notes (Signed)
Patient ID: Steven Higgins, male   DOB: [birth year], 46 y.o.   MRN: [id number]             Referring physician: [facility]  Chief complaint: High blood pressure  History of Present Illness:  His hypertension was diagnosed about 10 or 11 years ago and details of his diagnosis and treatment are unknown He had been treated for this for several years but about 2 years ago he decided to stop his medications on his own especially because he was getting cough from lisinopril. He did not have any follow-up for his hypertension even though he thinks his blood pressure was running around 180  He was admitted to the hospital in 03/2016 with than intracerebral hemorrhage and his blood pressure was markedly increased at that time.  He was treated aggressively and blood pressure apparently improved with regimen of amlodipine, labetalol and lisinopril  He was also found to have persistent HYPOKALEMIA in the hospital with potassium levels as low as 2.5   Magnesium level was low normal at 1.7  Recent history:  On his initial evaluation in the office he was told to stop the chlorthalidone that was started by cardiologist since he had a history of hypokalemia and his potassium had gone down to 2.7 24-hour urine potassium was ordered since his although strong level in the hospital was only 9.9 in the morning hours His potassium is significantly high at 57 despite having hypokalemia  He was also told to stop his lisinopril because of cough and started on doxazosin He has been seen by the cardiology clinic and started on VALSARTAN but not clear why he is not taking labetalol His blood pressure is appearing to be higher now He does not monitor at home  BP Readings from Last 3 Encounters:  05/01/16 (!) 146/100  04/24/16 (!) 138/92  04/09/16 130/78     Past Medical History:  Diagnosis Date  . Carotid artery stenosis 04/04/2016   1-39% bilateral by dopplers 03/2016  . DCM (dilated cardiomyopathy) (HCC)     EF 40-45% by echo 03/2016 presumed due to HTN  . Hyperlipidemia LDL goal <70   . Hypertensive heart disease with CHF (HCC)   . 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 Renal artery stenosis (HCC) 04/04/2016   By renal dopplers 03/2016 1-59%  . Seizure disorder (HCC)    as sequela of small intracerebral bleed    No past surgical history on file.  Family History  Problem Relation Age of Onset  . Diabetes Mother   . Heart disease Mother   . Hypertension Mother   . Heart disease Father   . Heart attack Father   . Hypertension Father   . Hypertension Maternal Grandmother   . Hypertension Paternal Grandmother     Social History:  reports that he has quit smoking. His smoking use included Cigarettes. He smoked 0.25 packs per day. He uses smokeless tobacco. He reports that he drinks alcohol. He reports that he does not use drugs.  Allergies: No Known Allergies  Allergies as of 05/01/2016   No Known Allergies     Medication List       Accurate as of 05/01/16 11:39 AM. Always use your most recent med list.          amLODipine 10 MG tablet Commonly known as:  NORVASC Take 1 tablet (10 mg total) by mouth daily.   atorvastatin 20 MG tablet Commonly known as:  LIPITOR  Take 1 tablet (20 mg total) by mouth daily at 6 PM.   chlorthalidone 25 MG tablet Commonly known as:  HYGROTON Take 1 tablet (25 mg total) by mouth daily.   doxazosin 4 MG tablet Commonly known as:  CARDURA Take 1 tablet (4 mg total) by mouth at bedtime.   labetalol 200 MG tablet Commonly known as:  NORMODYNE Take 1 tablet (200 mg total) by mouth 3 (three) times daily.   levETIRAcetam 500 MG tablet Commonly known as:  KEPPRA Take 1 tablet (500 mg total) by mouth 2 (two) times daily.   nicotine 14 mg/24hr patch Commonly known as:  NICODERM CQ - dosed in mg/24 hours Place 1 patch (14 mg total) onto the skin daily.   potassium chloride SA 20 MEQ tablet Commonly known as:   K-DUR,KLOR-CON Take 1 tablet (20 mEq total) by mouth 2 (two) times daily.   valsartan 160 MG tablet Commonly known as:  DIOVAN Take 1 tablet (160 mg total) by mouth daily.       LABS:  No visits with results within 1 Week(s) from this visit.  Latest known visit with results is:  Office Visit on 04/24/2016  Component Date Value Ref Range Status  . Glucose 04/24/2016 157* 65 - 99 mg/dL Final  . BUN 16/10/9602 8  6 - 24 mg/dL Final  . Creatinine, Ser 04/24/2016 1.09  0.76 - 1.27 mg/dL Final  . GFR calc non Af Amer 04/24/2016 83  >59 mL/min/1.73 Final  . GFR calc Af Amer 04/24/2016 96  >59 mL/min/1.73 Final  . BUN/Creatinine Ratio 04/24/2016 7* 9 - 20 Final  . Sodium 04/24/2016 144  134 - 144 mmol/L Final  . Potassium 04/24/2016 3.1* 3.5 - 5.2 mmol/L Final  . Chloride 04/24/2016 96  96 - 106 mmol/L Final  . CO2 04/24/2016 30* 18 - 29 mmol/L Final  . 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  Since he does not have insurance he will do this as an outpatient with increasing salt intake and adding salt tablets 6 g daily  Restart labetalol and STOP valsartan as this interferes with his aldosterone testing  Increase potassium to  3 tablets daily  Increase doxazosin up to 8 mg  Patient Instructions  Blood pressure medication changes:  STOP valsartan  Increase doxazosin 2 tablets daily  Restart LABETALOL twice a day  POTASSIUM: Increase this to 3 tablets daily   In 2 weeks you will be doing start loading test As Follows:  Need to Get SALT tablets from any pharmacy which you will be taking 2 tablets with every meal along with increasing disorganized intake in her  diet as much as possible for 3 straight days  On the third day you will collect urine for 24 hours     Royston Bekele 05/01/2016, 11:39 AM

## 2016-05-09 ENCOUNTER — Ambulatory Visit (INDEPENDENT_AMBULATORY_CARE_PROVIDER_SITE_OTHER): Payer: Self-pay | Admitting: Neurology

## 2016-05-09 ENCOUNTER — Encounter: Payer: Self-pay | Admitting: Neurology

## 2016-05-09 VITALS — BP 111/72 | HR 72 | Ht 78.0 in | Wt 216.2 lb

## 2016-05-09 DIAGNOSIS — I611 Nontraumatic intracerebral hemorrhage in hemisphere, cortical: Secondary | ICD-10-CM

## 2016-05-09 NOTE — Patient Instructions (Signed)
I had a long d/w patient about his recent hemorrhagic stroke,seizure, risk for recurrent stroke/TIAs/seizures, personally independently reviewed imaging studies and stroke evaluation results and answered questions.Continue Keppra for seizure prevention and maintain strict control of hypertension with blood pressure goal below 130/90,  and lipids with LDL cholesterol goal below 70 mg/dL. I also advised the patient  to do orthostatic tolerance exercises before he gets up to help with his dizziness. He was also advised to see his primary care physician to discuss alternative blood pressure medications if his dizziness persist. I advised him to avoid seizure provoking factors like sleep deprivation, medication noncompliance, extremes of exercise or dietary changes and not to drive for the next 3-6 months as per [facility]. He may return to work but increase his activity gradually as tolerated. Followup in the future with my nurse practitioner in 3 months or call earlier if necessary   Seizure, Adult When you have a seizure:  Parts of your body may move.  How aware or awake (conscious) you are may change.  You may shake (convulse). Some people have symptoms right before a seizure happens. These symptoms may include:  Fear.  Worry (anxiety).  Feeling like you are going to throw up (nausea).  Feeling like the room is spinning (vertigo).  Feeling like you saw or heard something before (deja vu).  Odd tastes or smells.  Changes in vision, such as seeing flashing lights or spots. Seizures usually last from 30 seconds to 2 minutes. Usually, they are not harmful unless they last a long time. Follow these instructions at home: Medicines   Take over-the-counter and prescription medicines only as told by your doctor.  Avoid anything that may keep your medicine from working, such as alcohol. Activity   Do not do any activities that would be dangerous if you had another seizure, like  driving or swimming. Wait until your doctor approves.  If you live in the [place] ask your local DMV (department of motor vehicles) when you can drive.  Rest. Teaching others   Teach friends and family what to do when you have a seizure. They should:  Lay you on the ground.  Protect your head and body.  Loosen any tight clothing around your neck.  Turn you on your side.  Stay with you until you are better.  Not hold you down.  Not put anything in your mouth.  Know whether or not you need emergency care. General instructions   Contact your doctor each time you have a seizure.  Avoid anything that gives you seizures.  Keep a seizure diary. Write down:  What you think caused each seizure.  What you remember about each seizure.  Keep all follow-up visits as told by your doctor. This is important. Contact a doctor if:  You have another seizure.  You have seizures more often.  There is any change in what happens during your seizures.  You continue to have seizures with treatment.  You have symptoms of being sick or having an infection. Get help right away if:  You have a seizure:  That lasts longer than 5 minutes.  That is different than seizures you had before.  That makes it harder to breathe.  After you hurt your head.  After a seizure, you cannot speak or use a part of your body.  After a seizure, you are confused or have a bad headache.  You have two or more seizures in a row.  You are having seizures  more often.  You do not wake up right after a seizure.  You get hurt during a seizure. In an emergency:  These symptoms may be an emergency. Do not wait to see if the symptoms will go away. Get medical help right away. Call your local emergency services (911 in the [place] Do not drive yourself to the hospital. This information is not intended to replace advice given to you by your health care provider. Make sure you discuss any questions you have  with your health care provider. Document Released: 06/06/2007 Document Revised: 08/31/2015 Document Reviewed: 08/31/2015 Elsevier Interactive Patient Education  2017 [employer].

## 2016-05-09 NOTE — Progress Notes (Signed)
[facility] [address] [place]. [place] [address] [phone]       OFFICE FOLLOW-UP NOTE  [name] Date of Birth:  February 25, 1970 Medical Record Number:  [id number]   HPI: [name] is a 22 year African-American male who is seen today for first office follow-up visit following hospital admission for intracerebral hemorrhage recently. History is obtained from the patient and review of electronic medical records and I have personally reviewed imaging films.Steven Higgins a 45 y.o.malewho was in his normal state of health until earlier this evening on 03/15/2016 when he had a seizure. Family member describes that he was extended and had his eyes turned though she is not certain which way. He was shaking and bit his tongue. He has since returned to baseline. On evaluation for this in the emergency department a CT was performed which shows a small hemorrhage in the left parietal region. This was unclear on initial CT and therefore an MRI was performed which does demonstrate a small acute hemorrhage. He does not have any symptoms from this, and therefore not certain we can say when it happened but likely would suspect that the seizure happened at onset. Of note, he has long-standing hypertension but has not taken his medicines and 3 years because the one that he was prescribed was $100 per month. ICH Score: 0. He was admitted to the neurological intensive care unit where blood pressure was tightly controlled initially with IV infusion subsequently with oral medications. He was started on Keppra for seizures. He had EEG which was normal. MRI scan showed a small parietal lobar hemorrhage with remote age chronic lacunar infarcts in bilateral basal ganglia and small multiple chronic micro-bleeds throughout. MRA of the brain showed no high-grade intact cranial stenosis or aneurysms. Transthoracic echo showed reduced ejection fraction of 40-45% with diffuse hypokinesis. LDL cholesterol  is elevated at 1 500 mg percent. HEENT: A1c was 6.1. Was seen by physical occupational speech therapy and was discharged home with outpatient therapies. Patient states he is doing better his blood pressure is much better controlled and it is 111/72 today. He however is complaining of dizziness. This appears to be more when his on his feet. He has seen his primary care physician and passed to see him every 2 weeks. He is tolerating Lipitor well without any side effects. He states he has no physical deficit from his hemorrhage and seizure but does complain of decreased stamina and increased fatigue. Patient wants to go back to work.  ROS:   14 system review of systems is positive for  dizziness, activity and appetite change, fatigue, weight change, ringing in the ears, eye itching, loss of vision, blurred vision, speech difficulty, weakness, dizziness, headache, behavior problem: Confusion, decreased concentration, neck pain and stiffness communicating muscles and cramps, back pain, skin itching, snoring, insomnia and all other systems negative  PMH:  Past Medical History:  Diagnosis Date  . Carotid artery stenosis 04/04/2016   1-39% bilateral by dopplers 03/2016  . DCM (dilated cardiomyopathy) (HCC)    EF 40-45% by echo 03/2016 presumed due to HTN  . Hyperlipidemia LDL goal <70   . Hypertensive heart disease with CHF (HCC)   . 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 Renal artery stenosis (HCC) 04/04/2016   By renal dopplers 03/2016 1-59%  . Seizure disorder (HCC)    as sequela of small intracerebral bleed  . Seizures (HCC)     Social History:  Social History  Social History  . Marital status: Single    Spouse name: N/A  . Number of children: N/A  . Years of education: N/A   Occupational History  . Not on file.   Social History Main Topics  . Smoking status: Current Every Day Smoker    Packs/day: 0.25    Types: Cigarettes  . Smokeless tobacco: Never Used     Comment:  smoke 1 to 2 cigarettes per day  . Alcohol use 0.6 oz/week    1 Cans of beer per week     Comment: occasional  . Drug use: No  . Sexual activity: Not on file   Other Topics Concern  . Not on file   Social History Narrative  . No narrative on file    Medications:   Current Outpatient Prescriptions on File Prior to Visit  Medication Sig Dispense Refill  . amLODipine (NORVASC) 10 MG tablet Take 1 tablet (10 mg total) by mouth daily. 30 tablet 2  . atorvastatin (LIPITOR) 20 MG tablet Take 1 tablet (20 mg total) by mouth daily at 6 PM. 30 tablet 2  . doxazosin (CARDURA) 4 MG tablet Take 1 tablet (4 mg total) by mouth at bedtime. 30 tablet 1  . labetalol (NORMODYNE) 200 MG tablet Take 1 tablet (200 mg total) by mouth 3 (three) times daily. 90 tablet 2  . levETIRAcetam (KEPPRA) 500 MG tablet Take 1 tablet (500 mg total) by mouth 2 (two) times daily. 60 tablet 2  . nicotine (NICODERM CQ - DOSED IN MG/24 HOURS) 14 mg/24hr patch Place 1 patch (14 mg total) onto the skin daily. 28 patch 0  . potassium chloride SA (K-DUR,KLOR-CON) 20 MEQ tablet Take 1 tablet (20 mEq total) by mouth 2 (two) times daily. 60 tablet 3  . valsartan (DIOVAN) 160 MG tablet Take 1 tablet (160 mg total) by mouth daily. 30 tablet 3   No current facility-administered medications on file prior to visit.     Allergies:  No Known Allergies  Physical Exam General: well developed, well nourished, seated, in no evident distress Head: head normocephalic and atraumatic.  Neck: supple with no carotid or supraclavicular bruits Cardiovascular: regular rate and rhythm, no murmurs Musculoskeletal: no deformity Skin:  no rash/petichiae Vascular:  Normal pulses all extremities Vitals:   05/09/16 0836  BP: 111/72  Pulse: 72   Neurologic Exam Mental Status: Awake and fully alert. Oriented to place and time. Recent and remote memory intact. Attention span, concentration and fund of knowledge appropriate. Mood and affect  appropriate.  Cranial Nerves: Fundoscopic exam reveals sharp disc margins. Pupils equal, briskly reactive to light. Extraocular movements full without nystagmus. Visual fields full to confrontation. Hearing intact. Facial sensation intact. Face, tongue, palate moves normally and symmetrically.  Motor: Normal bulk and tone. Normal strength in all tested extremity muscles. Sensory.: intact to touch ,pinprick .position and vibratory sensation.  Coordination: Rapid alternating movements normal in all extremities. Finger-to-nose and heel-to-shin performed accurately bilaterally. Gait and Station: Arises from chair without difficulty. Stance is normal. Gait demonstrates normal stride length and balance . Able to heel, toe and tandem walk without difficulty.  Reflexes: 1+ and symmetric. Toes downgoing.   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 Patient is doing clinically quite well but does complain of dizziness which may be orthostatic and medication side effect    PLAN: I had a long d/w patient about  his recent hemorrhagic stroke,seizure, risk for recurrent stroke/TIAs/seizures, personally independently reviewed imaging studies and stroke evaluation results and answered questions.Continue Keppra for seizure prevention and maintain strict control of hypertension with blood pressure goal below 130/90,  and lipids with LDL cholesterol goal below 70 mg/dL. I also advised the patient  to do orthostatic tolerance exercises before he gets up to help with his dizziness. He was also advised to see his primary care physician to discuss alternative blood pressure medications if his dizziness persist. I advised him to avoid seizure provoking factors like sleep deprivation, medication noncompliance, extremes of exercise or dietary changes and not to drive for the next 3-6 months as  per [facility]. He may return to work but increase his activity gradually as tolerated. Followup in the future with my nurse practitioner in 3 months or call earlier if necessary Greater than 50% of time during this 25 minute visit was spent on counseling,explanation of diagnosis, planning of further management, discussion with patient and family and coordination of care Delia Heady, MD  [facility] [address] [place], [place] [address]  Phone [phone] Fax [phone] Note: This document was prepared with digital dictation and possible smart phrase technology. Any transcriptional errors that result from this process are unintentional

## 2016-05-10 ENCOUNTER — Ambulatory Visit: Payer: MEDICAID | Admitting: Neurology

## 2016-05-11 ENCOUNTER — Other Ambulatory Visit: Payer: Self-pay

## 2016-05-11 ENCOUNTER — Telehealth: Payer: Self-pay | Admitting: Endocrinology

## 2016-05-11 DIAGNOSIS — I10 Essential (primary) hypertension: Secondary | ICD-10-CM

## 2016-05-11 MED FILL — POTASSIUM CL ER 20 MEQ TABL: 20 | 30 days supply | Qty: 60 | Fill #0

## 2016-05-11 MED FILL — ATORVASTATIN 20 MG TABLET: 20 | 30 days supply | Qty: 30 | Fill #2

## 2016-05-11 MED FILL — DOXAZOSIN MESYLATE 4 MG TAB: 4 | 30 days supply | Qty: 30 | Fill #0

## 2016-05-11 NOTE — Addendum Note (Signed)
Addended by: Adline Mango I on: 05/11/2016 03:45 PM   Modules accepted: Orders

## 2016-05-11 NOTE — Addendum Note (Signed)
Addended by: Adline Mango I on: 05/11/2016 03:28 PM   Modules accepted: Orders

## 2016-05-11 NOTE — Telephone Encounter (Signed)
Pt came by to check on his FMLA paperwork, he needs it done ASAP.

## 2016-05-14 NOTE — Telephone Encounter (Signed)
Paperwork has been placed on your desk for you to complete.

## 2016-05-14 NOTE — Telephone Encounter (Signed)
He should probably get this done from his PCP or cardiologist may be unable to complete this until middle of next week

## 2016-05-15 NOTE — Telephone Encounter (Signed)
I called patient and was unable to leave a voice message. Will try again later.

## 2016-05-15 NOTE — Telephone Encounter (Signed)
Patient called back and said that he has an appointment with Dr. Lucianne Muss soon and he may wait to get his paperwork then. I also gave him Lindley Magnus number, his cardiologist [phone]) and he may reach out to her. He only goes to Dr. Lucianne Muss and Dr. Mayford Knife he stated.

## 2016-05-18 LAB — SODIUM, URINE, 24 HOUR
Sodium, 24H Ur: 92 mmol/24 hr (ref 58–337)
Sodium, Ur: 46 mmol/L

## 2016-05-18 LAB — ALDOSTERONE, URINE
ALDOSTERONE 24H UR: 12.58 ug/(24.h) (ref 0.00–19.00)
ALDOSTERONE U, RANDOM: 6.29 ug/L

## 2016-05-18 LAB — CREATININE, URINE, 24 HOUR
Creatinine, 24H Ur: 1114 mg/24 hr (ref 1000–2000)
Creatinine, Urine: 55.7 mg/dL

## 2016-05-22 ENCOUNTER — Ambulatory Visit: Payer: Self-pay

## 2016-05-22 NOTE — Progress Notes (Deleted)
Patient ID: Steven Higgins                 DOB: [birth year]                      MRN: [id number]     HPI: Steven Higgins is a 46 y.o. male patient of Dr. Mayford Knife who presents today for hypertension follow up.  PMH includes HTN. He was recently was in the hospital after a syncopal episode.  He was witnessed to have jerking and shaking movements with his eyes rolling back in his head.  In ER he was noted to be post ictal and potassium was 2.5 and BP 193/122mmHg.  A head CT showed a small hemorrhage in the left parietal lobe secondary to hypertensive microvascular disease.  Carotid dopplers showed mild 1-39% nonobstructive disease and echo showed EF 40-45%. Renal dopplers showed 1-59% renal artery stenosis bilaterally. At his most recent OV with HTN clinic he was started on valsartan 160mg  daily.   He was since seen by PCP and his chlorthalidone was stopped due to low potassium. He reported stopping labetalol and this was restarted. He also was told to stop valsartan for aldosterone testing and his doxazosin was increased to 8mg  daily.   He presents today and states h   Current HTN meds:  Labetalol 200mg  twice daily  Doxazosin 8mg  Qpm Chlorthalidone 25mg  daily  Amlodipine 10mg  daily  Valsartan 160mg  daily   Previously tried: cough with carvedilol and metoprolol. Lisinopril stopped due to cough?  BP goal: <130/80  Family History: The patient's family history includes Diabetes in his mother; Heart attack in his father; Heart disease in his father and mother  Social History: Former smoker, currently uses smokeless tobacco, occasional alcohol  Diet: Most meals from home, uses seasonings but tries to avoid salts. No coffee and has decreased tea.   Exercise: Waiting to exercise for test results.   Home BP readings: no machine at home.   Wt Readings from Last 3 Encounters:  05/09/16 216 lb 3.2 oz (98.1 kg)  05/01/16 215 lb (97.5 kg)  04/20/16 212 lb (96.2 kg)   BP Readings from Last 3  Encounters:  05/09/16 111/72  05/01/16 (!) 146/100  04/24/16 (!) 138/92   Pulse Readings from Last 3 Encounters:  05/09/16 72  05/01/16 82  04/24/16 69    Renal function: CrCl cannot be calculated (Patient's most recent lab result is older than the maximum 21 days allowed.).  Past Medical History:  Diagnosis Date  . Carotid artery stenosis 04/04/2016   1-39% bilateral by dopplers 03/2016  . DCM (dilated cardiomyopathy) (HCC)    EF 40-45% by echo 03/2016 presumed due to HTN  . Hyperlipidemia LDL goal <70   . Hypertensive heart disease with CHF (HCC)   . 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 Renal artery stenosis (HCC) 04/04/2016   By renal dopplers 03/2016 1-59%  . Seizure disorder (HCC)    as sequela of small intracerebral bleed  . Seizures (HCC)     Current Outpatient Prescriptions on File Prior to Visit  Medication Sig Dispense Refill  . amLODipine (NORVASC) 10 MG tablet Take 1 tablet (10 mg total) by mouth daily. 30 tablet 2  . atorvastatin (LIPITOR) 20 MG tablet Take 1 tablet (20 mg total) by mouth daily at 6 PM. 30 tablet 2  . chlorthalidone (HYGROTON) 25 MG tablet   3  . doxazosin (CARDURA) 4 MG tablet Take 1  tablet (4 mg total) by mouth at bedtime. 30 tablet 1  . labetalol (NORMODYNE) 200 MG tablet Take 1 tablet (200 mg total) by mouth 3 (three) times daily. 90 tablet 2  . levETIRAcetam (KEPPRA) 500 MG tablet Take 1 tablet (500 mg total) by mouth 2 (two) times daily. 60 tablet 2  . lisinopril (PRINIVIL,ZESTRIL) 10 MG tablet   3  . nicotine (NICODERM CQ - DOSED IN MG/24 HOURS) 14 mg/24hr patch Place 1 patch (14 mg total) onto the skin daily. 28 patch 0  . potassium chloride SA (K-DUR,KLOR-CON) 20 MEQ tablet Take 1 tablet (20 mEq total) by mouth 2 (two) times daily. 60 tablet 3  . valsartan (DIOVAN) 160 MG tablet Take 1 tablet (160 mg total) by mouth daily. 30 tablet 3   No current facility-administered medications on file prior to visit.     No Known  Allergies  There were no vitals taken for this visit.   Assessment/Plan: Hypertension: BP today remains above goal <130/80.    Thank you, Freddie Apley. Cleatis Polka, PharmD  [facility]  05/22/2016 9:21 AM

## 2016-05-23 MED FILL — VALSARTAN 160 MG TABLET: 160 | 30 days supply | Qty: 30 | Fill #1

## 2016-05-24 ENCOUNTER — Telehealth: Payer: Self-pay

## 2016-05-24 DIAGNOSIS — R079 Chest pain, unspecified: Secondary | ICD-10-CM

## 2016-05-24 NOTE — Telephone Encounter (Signed)
-----   Message from Quintella Reichert, MD sent at 04/22/2016  1:06 PM EDT ----- Nuclear stress test with no ischemia so likely DCM related to HTN.  Please get a coronary CTA with morphology and calcium score for Dr. Delton See to read

## 2016-05-24 NOTE — Telephone Encounter (Signed)
Informed patient of results and verbal understanding expressed.  Patient has since seen PCP and his Kdur was increased for a few days.  He is now taking Kdur 20 meq BID again. He will repeat BMET 5/31. CT ordered for scheduling. Patient understands he will be called to schedule when new scanner is up and running. Patient agrees with treatment plan.

## 2016-05-24 NOTE — Telephone Encounter (Signed)
-----   Message from Quintella Reichert, MD sent at 04/25/2016  1:25 PM EDT ----- Potassium is still low - please verify that patient is currently taking Kdur BID and if yes then increase to 2 tablets BID and repeat BMET in 1 week

## 2016-05-29 MED FILL — LABETALOL HCL 200 MG TABLET: 200 | 30 days supply | Qty: 90 | Fill #2

## 2016-05-29 MED FILL — AMLODIPINE BESYLATE 10 MG T: 10 | 30 days supply | Qty: 30 | Fill #2

## 2016-05-31 ENCOUNTER — Telehealth: Payer: Self-pay

## 2016-05-31 ENCOUNTER — Encounter: Payer: Self-pay | Admitting: Endocrinology

## 2016-05-31 ENCOUNTER — Other Ambulatory Visit: Payer: Self-pay | Admitting: *Deleted

## 2016-05-31 ENCOUNTER — Ambulatory Visit (INDEPENDENT_AMBULATORY_CARE_PROVIDER_SITE_OTHER): Payer: Self-pay | Admitting: Endocrinology

## 2016-05-31 VITALS — BP 152/83 | HR 77 | Ht 78.0 in | Wt 217.0 lb

## 2016-05-31 DIAGNOSIS — R079 Chest pain, unspecified: Secondary | ICD-10-CM

## 2016-05-31 DIAGNOSIS — I10 Essential (primary) hypertension: Secondary | ICD-10-CM

## 2016-05-31 LAB — BASIC METABOLIC PANEL
BUN: 17 mg/dL (ref 6–23)
CALCIUM: 9.9 mg/dL (ref 8.4–10.5)
CO2: 37 meq/L — AB (ref 19–32)
Chloride: 95 mEq/L — ABNORMAL LOW (ref 96–112)
Creatinine, Ser: 1.28 mg/dL (ref 0.40–1.50)
GFR: 78.5 mL/min (ref >60.00)
Glucose, Bld: 160 mg/dL — ABNORMAL HIGH (ref 70–99)
Potassium: 2.5 mEq/L — CL (ref 3.5–5.1)
SODIUM: 139 meq/L (ref 135–145)

## 2016-05-31 NOTE — Patient Instructions (Addendum)
STOP valsartan and chlorthalidone  Increase doxazosin 2 tablets daily  LABETALOL same  POTASSIUM: 3 tablets daily   In 2 weeks you will be doing start loading test As Follows:  SALT tablets from any pharmacy which you will be taking FOUR tablets with every meal  along with increasing SALT intake in diet as much as possible for 3 straight days  On the third day you will collect urine for 24 hours

## 2016-05-31 NOTE — Progress Notes (Addendum)
Patient ID: Steven Higgins, male   DOB: 1970/10/20, 46 y.o.   MRN: [id number]              Chief complaint: High blood pressure  History of Present Illness:  Background information: His hypertension was diagnosed about 10 or 11 years ago and details of his diagnosis and treatment are unknown He had been treated for this for several years but about 2 years ago he decided to stop his medications on his own especially because he was getting cough from lisinopril. He did not have any follow-up for his hypertension even though he thinks his blood pressure was running around 180 He was admitted to the hospital in 03/2016 with than intracerebral hemorrhage and his blood pressure was markedly increased at that time.  He was treated aggressively and blood pressure apparently improved with regimen of amlodipine, labetalol and lisinopril  He was also found to have persistent HYPOKALEMIA in the hospital with potassium levels as low as 2.5   Magnesium level was low normal at 1.7  Recent history:  On his initial evaluation in the office he was told to stop the chlorthalidone that was started by cardiologist since he had a history of hypokalemia  However not clear why he is still taking this 24-hour urine potassium was ordered and this was significantly high at 57 despite having hypokalemia  He was also told to stop his lisinopril because of cough and started on doxazosin He has been seen by the cardiology clinic and started on VALSARTAN but not clear why he is still taking this even though he was told to stop this in preparation for his aldosterone studies However he is now taking labetalol 3 times a day  His blood pressure is improving and he has less headaches He does not monitor at home  BP Readings from Last 3 Encounters:  05/31/16 (!) 152/83  05/09/16 111/72  05/01/16 (!) 146/100    STUDIES: He was told to take salt tablets for 3 days at least 6 tablets daily and increase oral salt intake  but he only took 1 tablet 3 times a day on the day of the collection; his urine sodium over 24 hours with only 92 making his aldosterone collection invalid   Past Medical History:  Diagnosis Date  . Carotid artery stenosis 04/04/2016   1-39% bilateral by dopplers 03/2016  . DCM (dilated cardiomyopathy) (HCC)    EF 40-45% by echo 03/2016 presumed due to HTN  . Hyperlipidemia LDL goal <70   . Hypertensive heart disease with CHF (HCC)   . 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 Renal artery stenosis (HCC) 04/04/2016   By renal dopplers 03/2016 1-59%  . Seizure disorder (HCC)    as sequela of small intracerebral bleed  . Seizures (HCC)     No past surgical history on file.  Family History  Problem Relation Age of Onset  . Diabetes Mother   . Heart disease Mother   . Hypertension Mother   . Heart disease Father   . Heart attack Father   . Hypertension Father   . Hypertension Maternal Grandmother   . Hypertension Paternal Grandmother     Social History:  reports that he has been smoking Cigarettes.  He has been smoking about 0.25 packs per day. He has never used smokeless tobacco. He reports that he drinks about 0.6 oz of alcohol per week . He reports that he does not use drugs.  Allergies: No  Known Allergies  Allergies as of 05/31/2016   No Known Allergies     Medication List       Accurate as of 05/31/16  8:34 PM. Always use your most recent med list.          amLODipine 10 MG tablet Commonly known as:  NORVASC Take 1 tablet (10 mg total) by mouth daily.   atorvastatin 20 MG tablet Commonly known as:  LIPITOR Take 1 tablet (20 mg total) by mouth daily at 6 PM.   chlorthalidone 25 MG tablet Commonly known as:  HYGROTON   doxazosin 4 MG tablet Commonly known as:  CARDURA Take 1 tablet (4 mg total) by mouth at bedtime.   labetalol 200 MG tablet Commonly known as:  NORMODYNE Take 1 tablet (200 mg total) by mouth 3 (three) times daily.   levETIRAcetam  500 MG tablet Commonly known as:  KEPPRA Take 1 tablet (500 mg total) by mouth 2 (two) times daily.   lisinopril 10 MG tablet Commonly known as:  PRINIVIL,ZESTRIL   nicotine 14 mg/24hr patch Commonly known as:  NICODERM CQ - dosed in mg/24 hours Place 1 patch (14 mg total) onto the skin daily.   potassium chloride SA 20 MEQ tablet Commonly known as:  K-DUR,KLOR-CON Take 1 tablet (20 mEq total) by mouth 2 (two) times daily.   valsartan 160 MG tablet Commonly known as:  DIOVAN Take 1 tablet (160 mg total) by mouth daily.       LABS:  Office Visit on 05/31/2016  Component Date Value Ref Range Status  . Sodium 05/31/2016 139  135 - 145 mEq/L Final  . Potassium 05/31/2016 2.5* 3.5 - 5.1 mEq/L Final  . Chloride 05/31/2016 95* 96 - 112 mEq/L Final  . CO2 05/31/2016 37* 19 - 32 mEq/L Final  . Glucose, Bld 05/31/2016 160* 70 - 99 mg/dL Final  . BUN 16/10/9602 17  6 - 23 mg/dL Final  . Creatinine, Ser 05/31/2016 1.28  0.40 - 1.50 mg/dL Final  . Calcium 54/09/8117 9.9  8.4 - 10.5 mg/dL Final  . 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  Instructions were printed out and discussed in detail so he can follow them better on this  visit   STOP valsartan as this interferes with his aldosterone testing  STOP chlorthalidone  He will do the urine collection in 2 weeks  Check potassium level today  Increase doxazosin up to 8 mg  He will need to use a home monitor to check his blood pressure regularly  Patient Instructions  STOP valsartan and chlorthalidone  Increase doxazosin 2 tablets daily  LABETALOL same  POTASSIUM: 3  tablets daily   In 2 weeks you will be doing start loading test As Follows:  SALT tablets from any pharmacy which you will be taking FOUR tablets with every meal  along with increasing SALT intake in diet as much as possible for 3 straight days  On the third day you will collect urine for 24 hours     Shellye Zandi 05/31/2016, 8:34 PM    ADDENDUM: Potassium 2.5, he will take 5 potassiums daily for week and then 3  07/02/16: Addendum #2: Urinary aldosterone after salt loading is 30.2 with urine sodium over 200 at this time.  Discussed with patient and he agrees to proceed with CT scan for further evaluation

## 2016-05-31 NOTE — Telephone Encounter (Signed)
Please let patient know potassium is very low, he will need to take  5 tablets instead of 3 tablets of potassium daily for the next week and potassium will be better with stopping chlorthalidone. Please send new prescription with instructions to take potassium 3 tablets daily and also doxazosin 8 mg daily

## 2016-05-31 NOTE — Telephone Encounter (Signed)
Lab called with critical lab:  Potassium- 2.5  Wanted you to be aware. Thank you!

## 2016-06-01 ENCOUNTER — Other Ambulatory Visit: Payer: Self-pay

## 2016-06-01 LAB — BASIC METABOLIC PANEL
BUN/Creatinine Ratio: 11 (ref 9–20)
BUN: 15 mg/dL (ref 6–24)
CO2: 32 mmol/L — ABNORMAL HIGH (ref 18–29)
CREATININE: 1.31 mg/dL — AB (ref 0.76–1.27)
Calcium: 9.8 mg/dL (ref 8.7–10.2)
Chloride: 92 mmol/L — ABNORMAL LOW (ref 96–106)
GFR, EST AFRICAN AMERICAN: 76 mL/min/{1.73_m2} (ref >59)
GFR, EST NON AFRICAN AMERICAN: 66 mL/min/{1.73_m2} (ref >59)
Glucose: 128 mg/dL — ABNORMAL HIGH (ref 65–99)
Potassium: 2.8 mmol/L — ABNORMAL LOW (ref 3.5–5.2)
SODIUM: 141 mmol/L (ref 134–144)

## 2016-06-01 MED ORDER — DOXAZOSIN MESYLATE 8 MG PO TABS: 8.0000 mg | ORAL_TABLET | Freq: Every day | ORAL | 1 refills | Status: DC

## 2016-06-01 MED ORDER — POTASSIUM CHLORIDE CRYS ER 20 MEQ PO TBCR: 20.0000 meq | EXTENDED_RELEASE_TABLET | Freq: Three times a day (TID) | ORAL | 3 refills | Status: DC

## 2016-06-01 NOTE — Telephone Encounter (Signed)
Called patient and let him know the new dosages of his medicines and he agreed. I sent to the [facility] for him.

## 2016-06-01 NOTE — Telephone Encounter (Signed)
Called patient and was not able to leave a voice message. Will try back again.

## 2016-06-05 MED FILL — POTASSIUM CL ER 20 MEQ TABL: 20 | 30 days supply | Qty: 90 | Fill #0

## 2016-06-05 MED FILL — levETIRAcetam 500 MG TABS: 500 | 30 days supply | Qty: 60 | Fill #2

## 2016-06-05 MED FILL — DOXAZOSIN MESYLATE 8 MG TAB: 8 | 30 days supply | Qty: 30 | Fill #0

## 2016-06-15 ENCOUNTER — Other Ambulatory Visit: Payer: Self-pay | Admitting: Endocrinology

## 2016-06-15 ENCOUNTER — Other Ambulatory Visit: Payer: Self-pay

## 2016-06-15 DIAGNOSIS — I10 Essential (primary) hypertension: Secondary | ICD-10-CM

## 2016-06-24 LAB — CREATININE, URINE, 24 HOUR
Creatinine, 24H Ur: 1874 mg/24 hr (ref 1000–2000)
Creatinine, Urine: 69.4 mg/dL

## 2016-06-24 LAB — SODIUM, URINE, 24 HOUR
Sodium, 24H Ur: 251 mmol/24 hr (ref 58–337)
Sodium, Ur: 93 mmol/L

## 2016-06-24 LAB — ALDOSTERONE, URINE
Aldosterone U,Random: 11.34 ug/L
Aldosterone, 24H Ur: 30.62 ug/24 hr — ABNORMAL HIGH (ref 0.00–19.00)

## 2016-07-02 ENCOUNTER — Other Ambulatory Visit: Payer: Self-pay | Admitting: Endocrinology

## 2016-07-02 DIAGNOSIS — E2609 Other primary hyperaldosteronism: Secondary | ICD-10-CM

## 2016-07-06 ENCOUNTER — Encounter: Payer: Self-pay | Admitting: Cardiology

## 2016-07-19 ENCOUNTER — Ambulatory Visit (HOSPITAL_COMMUNITY): Admission: RE | Admit: 2016-07-19 | Payer: Self-pay | Source: Ambulatory Visit

## 2016-07-19 ENCOUNTER — Ambulatory Visit (HOSPITAL_COMMUNITY)
Admission: RE | Admit: 2016-07-19 | Discharge: 2016-07-19 | Disposition: A | Discharge status: Home / Self Care | Payer: Self-pay | Source: Ambulatory Visit | Attending: Cardiology | Admitting: Cardiology

## 2016-07-19 DIAGNOSIS — R918 Other nonspecific abnormal finding of lung field: Secondary | ICD-10-CM | POA: Insufficient documentation

## 2016-07-19 DIAGNOSIS — R079 Chest pain, unspecified: Secondary | ICD-10-CM

## 2016-07-19 LAB — POCT I-STAT CREATININE: CREATININE: 1.2 mg/dL (ref 0.61–1.24)

## 2016-07-19 MED ORDER — IOPAMIDOL (ISOVUE-370) INJECTION 76%
80.0000 mL | Freq: Once | INTRAVENOUS | Status: AC | PRN
  Administered 2016-07-19: 80 mL via INTRAVENOUS

## 2016-07-19 MED ORDER — METOPROLOL TARTRATE 5 MG/5ML IV SOLN
INTRAVENOUS | Status: AC
  Administered 2016-07-19: 5 mg via INTRAVENOUS
  Filled 2016-07-19: qty 5

## 2016-07-19 MED ORDER — METOPROLOL TARTRATE 5 MG/5ML IV SOLN
5.0000 mg | Freq: Once | INTRAVENOUS | Status: AC
  Administered 2016-07-19: 5 mg via INTRAVENOUS

## 2016-07-19 MED ORDER — NITROGLYCERIN 0.4 MG SL SUBL
0.8000 mg | SUBLINGUAL_TABLET | Freq: Once | SUBLINGUAL | Status: AC
  Administered 2016-07-19: 0.8 mg via SUBLINGUAL

## 2016-07-19 MED ORDER — NITROGLYCERIN 0.4 MG SL SUBL
SUBLINGUAL_TABLET | SUBLINGUAL | Status: AC
  Administered 2016-07-19: 0.8 mg via SUBLINGUAL
  Filled 2016-07-19: qty 2

## 2016-07-19 NOTE — Progress Notes (Signed)
Patient tolerated CT without incident.  Drank water and ambulatory steady gait.

## 2016-07-24 ENCOUNTER — Telehealth: Payer: Self-pay

## 2016-07-24 MED ORDER — IRBESARTAN 150 MG PO TABS: 150.0000 mg | ORAL_TABLET | Freq: Every day | ORAL | 3 refills | Status: DC

## 2016-07-24 MED ORDER — ASPIRIN EC 81 MG PO TBEC: 81.0000 mg | DELAYED_RELEASE_TABLET | Freq: Every day | ORAL | 3 refills | Status: DC

## 2016-07-24 MED FILL — IRBESARTAN 150 MG TABLET: 150 | 90 days supply | Qty: 90 | Fill #0

## 2016-07-24 NOTE — Telephone Encounter (Signed)
Informed patient of results and verbal understanding expressed.   Encouraged aggressive risk factor modification. Instructed patient to START ASA 81 mg daily. Instructed patient to STOP VALSARTAN and to START AVAPRO 150 mg daily. He will check his BP daily for a week and call.  He is currently not taking Lipitor at all. His has one day left of doxazosin. Instructed patient to call Dr. Lucianne Muss for refills.  Patient agrees with treatment plan.  6 mo recall placed.  Med list updated.

## 2016-07-24 NOTE — Telephone Encounter (Signed)
-----   Message from Quintella Reichert, MD sent at 07/19/2016  8:06 PM EDT ----- Coronary CTA showed high calcium score with tortuous RCA with aneurysmal dilatation and mild non obstructive plaque 25-50% and minimal plaque in the LAD and LCx.  He needs aggressive risk factor modification.  Please start ASA 81mg  daily and find out if he is still taking Lipitor and if so what dose.  Also he is currently taking Valsartan.  Please have him stop Valsartan and start Avapro 150mg  daily to due valsartan recall.  Have him check his BP daily for a week and call with results.

## 2016-07-25 ENCOUNTER — Ambulatory Visit (HOSPITAL_COMMUNITY): Payer: Self-pay

## 2016-07-25 ENCOUNTER — Other Ambulatory Visit (HOSPITAL_COMMUNITY): Payer: Self-pay

## 2016-07-26 ENCOUNTER — Telehealth: Payer: Self-pay

## 2016-07-26 DIAGNOSIS — E785 Hyperlipidemia, unspecified: Secondary | ICD-10-CM

## 2016-07-26 MED ORDER — ATORVASTATIN CALCIUM 40 MG PO TABS: 40.0000 mg | ORAL_TABLET | Freq: Every day | ORAL | 3 refills | Status: DC

## 2016-07-26 MED FILL — ATORVASTATIN 40 MG TABLET: 40 | 30 days supply | Qty: 30 | Fill #0

## 2016-07-26 NOTE — Telephone Encounter (Signed)
Instructed patient to START LIPITOR 70 mg daily. FLP and ALT scheduled 9/27. Patient agrees with treatment plan.

## 2016-07-26 NOTE — Telephone Encounter (Signed)
-----   Message from Quintella Reichert, MD sent at 07/26/2016 10:52 AM EDT ----- LDL was 151 03/2016 and his goal is < 70.  Please start Lipitor 40mg  daily and repeat FLp and ALT in 6 weeks.  Armanda Magic, ----- Message ----- From: Henrietta Dine, RN Sent: 07/26/2016   9:12 AM To: Quintella Reichert, MD  It was supposed to be his last lipids. Look in labs on date 03/16/16 ----- Message ----- From: Quintella Reichert, MD Sent: 07/25/2016   8:23 PM To: Henrietta Dine, RN  Nothing attached  Traci ----- Message ----- From: Henrietta Dine, RN Sent: 07/25/2016  11:33 AM To: Quintella Reichert, MD  See cardiac CT results. This is this patient's most recent lipid panel. He is currently not taking a statin.

## 2016-07-29 NOTE — Progress Notes (Signed)
[facility]  PATIENT: Steven Higgins DOB: Oct 30, 1970   REASON FOR VISIT: Follow-up for intracerebral hemorrhage HISTORY FROM: Patient    HISTORY OF PRESENT ILLNESS:UPDATE 07/30/2018CM Steven Higgins, 46 year old male returns for follow-up with history of intracerebral hemorrhage in March 2018. He also had a seizure. He has a long history of hypertension and had not taken his medications for a while. On return visit today blood pressure is elevated at 170/90 and he has not taken his medications today. He is currently on aspirin for secondary stroke prevention without further stroke or TIA symptoms. He denies any seizure activity and states his only taking his Keppra daily. He was made aware that it is suppose to be taken twice daily and he needs refills. He remains on Lipitor without myalgias. He is back working at [employer] several days a week. He returns for reevaluation   HISTORY 05/09/16 Steven Higgins is a 83 year African-American male who is seen today for first office follow-up visit following hospital admission for intracerebral hemorrhage recently. History is obtained from the patient and review of electronic medical records and I have personally reviewed imaging films.Steven Higgins a 46 y.o.malewho was in his normal state of health until earlier this evening on 03/15/2016 when he had a seizure. Family member describes that he was extended and had his eyes turned though she is not certain which way. He was shaking and bit his tongue. He has since returned to baseline. On evaluation for this in the emergency department a CT was performed which shows a small hemorrhage in the left parietal region. This was unclear on initial CT and therefore an MRI was performed which does demonstrate a small acute hemorrhage. He does not have any symptoms from this, and therefore not certain we can say when it happened but likely would suspect that the seizure happened at onset. Of note, he has  long-standing hypertension but has not taken his medicines and 3 years because the one that he was prescribed was $100 per month. ICH Score: 0. He was admitted to the neurological intensive care unit where blood pressure was tightly controlled initially with IV infusion subsequently with oral medications. He was started on Keppra for seizures. He had EEG which was normal. MRI scan showed a small parietal lobar hemorrhage with remote age chronic lacunar infarcts in bilateral basal ganglia and small multiple chronic micro-bleeds throughout. MRA of the brain showed no high-grade intact cranial stenosis or aneurysms. Transthoracic echo showed reduced ejection fraction of 40-45% with diffuse hypokinesis. LDL cholesterol is elevated at 1 500 mg percent. HEENT: A1c was 6.1. Was seen by physical occupational speech therapy and was discharged home with outpatient therapies. Patient states he is doing better his blood pressure is much better controlled and it is 111/72 today. He however is complaining of dizziness. This appears to be more when his on his feet. He has seen his primary care physician and passed to see him every 2 weeks. He is tolerating Lipitor well without any side effects. He states he has no physical deficit from his hemorrhage and seizure but does complain of decreased stamina and increased fatigue. Patient wants to go back to work.    REVIEW OF SYSTEMS: Full 14 system review of systems performed and notable only for those listed, all others are neg:  Constitutional: Fatigue  Cardiovascular: neg Ear/Nose/Throat: neg  Skin: neg Eyes: neg Respiratory: neg Gastroitestinal: neg  Hematology/Lymphatic: neg  Endocrine: neg Musculoskeletal: Back pain joint pain Allergy/Immunology: neg Neurological: Occasional dizziness  Psychiatric: neg Sleep : neg   ALLERGIES: No Known Allergies  HOME MEDICATIONS: Outpatient Medications Prior to Visit  Medication Sig Dispense Refill  . aspirin EC 81 MG  tablet Take 1 tablet (81 mg total) by mouth daily. 90 tablet 3  . atorvastatin (LIPITOR) 40 MG tablet Take 1 tablet (40 mg total) by mouth daily. 90 tablet 3  . irbesartan (AVAPRO) 150 MG tablet Take 1 tablet (150 mg total) by mouth daily. 90 tablet 3  . levETIRAcetam (KEPPRA) 500 MG tablet Take 1 tablet (500 mg total) by mouth 2 (two) times daily. 60 tablet 2  . potassium chloride SA (K-DUR,KLOR-CON) 20 MEQ tablet Take 1 tablet (20 mEq total) by mouth 3 (three) times daily. 90 tablet 3  . nicotine (NICODERM CQ - DOSED IN MG/24 HOURS) 14 mg/24hr patch Place 1 patch (14 mg total) onto the skin daily. (Patient not taking: Reported on 07/30/2016) 28 patch 0  . doxazosin (CARDURA) 8 MG tablet Take 1 tablet (8 mg total) by mouth daily. 30 tablet 1   No facility-administered medications prior to visit.     PAST MEDICAL HISTORY: Past Medical History:  Diagnosis Date  . Carotid artery stenosis 04/04/2016   1-39% bilateral by dopplers 03/2016  . DCM (dilated cardiomyopathy) (HCC)    EF 40-45% by echo 03/2016 presumed due to HTN  . Hyperlipidemia LDL goal <70   . Hypertensive heart disease with CHF (HCC)   . 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 Renal artery stenosis (HCC) 04/04/2016   By renal dopplers 03/2016 1-59%  . Seizure disorder (HCC)    as sequela of small intracerebral bleed  . Seizures (HCC)     PAST SURGICAL HISTORY: History reviewed. No pertinent surgical history.  FAMILY HISTORY: Family History  Problem Relation Age of Onset  . Diabetes Mother   . Heart disease Mother   . Hypertension Mother   . Heart disease Father   . Heart attack Father   . Hypertension Father   . Hypertension Maternal Grandmother   . Hypertension Paternal Grandmother     SOCIAL HISTORY: Social History   Social History  . Marital status: Single    Spouse name: N/A  . Number of children: N/A  . Years of education: N/A   Occupational History  . Not on file.   Social History Main  Topics  . Smoking status: Current Every Day Smoker    Packs/day: 0.25    Types: Cigarettes  . Smokeless tobacco: Never Used     Comment: 07/30/16 smoke 1 to 2 cigarettes per day-not wearing patch  . Alcohol use 0.6 oz/week    1 Cans of beer per week     Comment: occasional  . Drug use: No  . Sexual activity: Not on file   Other Topics Concern  . Not on file   Social History Narrative  . No narrative on file     PHYSICAL EXAM  Vitals:   07/30/16 0925  BP: (!) 170/90   Pulse: 65  Weight: 214 lb 8 oz (97.3 kg)   Body mass index is 24.79 kg/m.  Generalized: Well developed, in no acute distress  Head: normocephalic and atraumatic,. Oropharynx benign  Neck: Supple, no carotid bruits  Cardiac: Regular rate rhythm, no murmur  Musculoskeletal: No deformity   Neurological examination   Mentation: Alert oriented to time, place, history taking. Attention span and concentration appropriate. Recent and remote memory intact.  Follows all commands speech  and language fluent.   Cranial nerve II-XII: Pupils were equal round reactive to light extraocular movements were full, visual field were full on confrontational test. Facial sensation and strength were normal. hearing was intact to finger rubbing bilaterally. Uvula tongue midline. head turning and shoulder shrug were normal and symmetric.Tongue protrusion into cheek strength was normal. Motor: normal bulk and tone, full strength in the BUE, BLE, fine finger movements normal, no pronator drift. No focal weakness Sensory: normal and symmetric to light touch, pinprick, and  Vibration, in the upper and lower extremities  Coordination: finger-nose-finger, heel-to-shin bilaterally, no dysmetria Reflexes: 1+ upper lower and symmetric, plantar responses were flexor bilaterally. Gait and Station: Rising up from seated position without assistance, normal stance,  moderate stride, good arm swing, smooth turning, able to perform tiptoe, and heel  walking without difficulty. Tandem gait is steady  DIAGNOSTIC DATA (LABS, IMAGING, TESTING) - I reviewed patient records, labs, notes, testing and imaging myself where available.  Lab Results  Component Value Date   WBC 7.2 03/18/2016   HGB 13.6 03/18/2016   HCT 41.2 03/18/2016   MCV 83.4 03/18/2016   PLT 186 03/18/2016      Component Value Date/Time   NA 141 05/31/2016 1223   K 2.8 (L) 05/31/2016 1223   CL 92 (L) 05/31/2016 1223   CO2 32 (H) 05/31/2016 1223   GLUCOSE 128 (H) 05/31/2016 1223   GLUCOSE 160 (H) 05/31/2016 1148   BUN 15 05/31/2016 1223   CREATININE 1.20 07/19/2016 0938   CREATININE 1.07 03/22/2016 1533   CALCIUM 9.8 05/31/2016 1223   PROT 6.6 03/15/2016 1904   ALBUMIN 3.9 03/15/2016 1904   AST 35 03/15/2016 1904   ALT 36 03/15/2016 1904   ALKPHOS 57 03/15/2016 1904   BILITOT 0.5 03/15/2016 1904   GFRNONAA 66 05/31/2016 1223   GFRAA 76 05/31/2016 1223   Lab Results  Component Value Date   CHOL 217 (H) 03/16/2016   HDL 50 03/16/2016   LDLCALC 151 (H) 03/16/2016   TRIG 79 03/16/2016   CHOLHDL 4.3 03/16/2016   Lab Results  Component Value Date   HGBA1C 6.1 (H) 03/16/2016   Lab Results  Component Value Date   VITAMINB12 598 03/16/2016   Lab Results  Component Value Date   TSH 0.568 03/16/2016      ASSESSMENT AND PLAN 54 year African-American male with 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 Patient is doing clinically quite well but Is not totally compliant with his medications. Blood pressure elevated in the office today has not taken his blood pressure medication, he is only taking Keppra once daily.   Stressed the importance of management of risk factors to prevent further stroke Continue aspirin for secondary stroke prevention Maintain strict control of hypertension with blood pressure goal below 130/90, today's reading 170/90 continue antihypertensive medications as prescribed  take B/P readings at home  Continue Keppra for seizure disorder, this medication is twice daily will renew  Control of diabetes with hemoglobin A1c below 6.5 followed by primary care  Cholesterol with LDL cholesterol less than 70, followed by primary care,  continue statin drugs Lipitor Exercise by walking,   eat healthy diet with whole grains,  fresh fruits and vegetables Follow up 6 months I spent 25 minutes in total face to face time with the patient more than 50% of which was spent counseling and coordination of care, reviewing test results reviewing medications and discussing and reviewing the diagnosis of stroke,  management of risk factors importance of compliance with medication Nilda Riggs, [facility], [facility], APRN  [facility] [address], [address] [place], [place] [address] [phone]

## 2016-07-30 ENCOUNTER — Ambulatory Visit (INDEPENDENT_AMBULATORY_CARE_PROVIDER_SITE_OTHER): Payer: Self-pay | Admitting: Nurse Practitioner

## 2016-07-30 ENCOUNTER — Encounter: Payer: Self-pay | Admitting: Nurse Practitioner

## 2016-07-30 VITALS — BP 170/90 | HR 65 | Wt 214.5 lb

## 2016-07-30 DIAGNOSIS — G40909 Epilepsy, unspecified, not intractable, without status epilepticus: Secondary | ICD-10-CM

## 2016-07-30 DIAGNOSIS — I611 Nontraumatic intracerebral hemorrhage in hemisphere, cortical: Secondary | ICD-10-CM

## 2016-07-30 DIAGNOSIS — I10 Essential (primary) hypertension: Secondary | ICD-10-CM

## 2016-07-30 DIAGNOSIS — E785 Hyperlipidemia, unspecified: Secondary | ICD-10-CM

## 2016-07-30 MED ORDER — LEVETIRACETAM 500 MG PO TABS: 500.0000 mg | ORAL_TABLET | Freq: Two times a day (BID) | ORAL | 2 refills | Status: DC

## 2016-07-30 MED FILL — levETIRAcetam 500 MG TABS: 500 | 30 days supply | Qty: 60 | Fill #0

## 2016-07-30 MED FILL — POTASSIUM CL ER 20 MEQ TABL: 20 | 30 days supply | Qty: 90 | Fill #1

## 2016-07-30 NOTE — Patient Instructions (Signed)
Stressed the importance of management of risk factors to prevent further stroke Continue aspirin for secondary stroke prevention Maintain strict control of hypertension with blood pressure goal below 130/90, today's reading 170/90 continue antihypertensive medications as prescribed take B/P at home  Continue Keppra for seizure disorder Control of diabetes with hemoglobin A1c below 6.5 followed by primary care  Cholesterol with LDL cholesterol less than 70, followed by primary care,  continue statin drugs Lipitor Exercise by walking,   eat healthy diet with whole grains,  fresh fruits and vegetables Follow up 6 months

## 2016-07-30 NOTE — Progress Notes (Signed)
I agree with the above plan 

## 2016-08-16 ENCOUNTER — Ambulatory Visit (INDEPENDENT_AMBULATORY_CARE_PROVIDER_SITE_OTHER): Payer: Self-pay | Admitting: Endocrinology

## 2016-08-16 ENCOUNTER — Other Ambulatory Visit: Payer: Self-pay | Admitting: Endocrinology

## 2016-08-16 ENCOUNTER — Encounter: Payer: Self-pay | Admitting: Endocrinology

## 2016-08-16 VITALS — BP 174/94 | HR 91 | Ht 78.0 in | Wt 215.0 lb

## 2016-08-16 DIAGNOSIS — I10 Essential (primary) hypertension: Secondary | ICD-10-CM

## 2016-08-16 LAB — BASIC METABOLIC PANEL
BUN: 11 mg/dL (ref 6–23)
CALCIUM: 9.6 mg/dL (ref 8.4–10.5)
CO2: 31 mEq/L (ref 19–32)
CREATININE: 1.1 mg/dL (ref 0.40–1.50)
Chloride: 98 mEq/L (ref 96–112)
GFR: 93.41 mL/min (ref >60.00)
Glucose, Bld: 180 mg/dL — ABNORMAL HIGH (ref 70–99)
Potassium: 2.9 mEq/L — ABNORMAL LOW (ref 3.5–5.1)
SODIUM: 136 meq/L (ref 135–145)

## 2016-08-16 MED ORDER — DOXAZOSIN MESYLATE 8 MG PO TABS: 8.0000 mg | ORAL_TABLET | Freq: Every day | ORAL | 1 refills | Status: DC

## 2016-08-16 MED FILL — DOXAZOSIN MESYLATE 8 MG TAB: 8 | 30 days supply | Qty: 30 | Fill #0

## 2016-08-16 NOTE — Progress Notes (Signed)
Patient ID: Steven Higgins, male   DOB: 1970-11-23, 46 y.o.   MRN: [id number]              Chief complaint: High blood pressure  History of Present Illness:  Background information: His hypertension was diagnosed about 10 or 11 years ago and details of his diagnosis and treatment are unknown He had been treated for this for several years but about 2 years ago he decided to stop his medications on his own especially because he was getting cough from lisinopril. He did not have any follow-up for his hypertension even though he thinks his blood pressure was running around 180 He was admitted to the hospital in 03/2016 with than intracerebral hemorrhage and his blood pressure was markedly increased at that time.  He was treated aggressively and blood pressure apparently improved with regimen of amlodipine, labetalol and lisinopril  He was also found to have persistent HYPOKALEMIA in the hospital with potassium levels as low as 2.5   Magnesium level was low normal at 1.7  Recent history:  On his initial evaluation 24-hour urine potassium was ordered and this was significantly high at 57 despite having hypokalemia  He was also switched from valsartan to Avapro recently He was supposed to continue doxazosin but he did not have a refill and did not come back for follow-up either He thinks that previously with all his medications his blood pressure was in the 130-1 40 range  Recently he is taking labetalol 3 times a day as directed Taking 2 potassium pills a day but no recent potassium levels available  His blood pressure is significantly worse but he thinks that this is because of his having neck and headache pains since he had a CT scan with a cardiologist  SALT LOADING test: His urine aldosterone was high at 30.6 and urine sodium was 251 on the second attempt  BP Readings from Last 3 Encounters:  08/16/16 (!) 174/94  07/30/16 (!) 170/90  07/19/16 (!) 172/102    Lab Results  Component  Value Date   CREATININE 1.20 07/19/2016   BUN 15 05/31/2016   NA 141 05/31/2016   K 2.8 (L) 05/31/2016   CL 92 (L) 05/31/2016   CO2 32 (H) 05/31/2016      Past Medical History:  Diagnosis Date  . Carotid artery stenosis 04/04/2016   1-39% bilateral by dopplers 03/2016  . DCM (dilated cardiomyopathy) (HCC)    EF 40-45% by echo 03/2016 presumed due to HTN  . Hyperlipidemia LDL goal <70   . Hypertensive heart disease with CHF (HCC)   . 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 Renal artery stenosis (HCC) 04/04/2016   By renal dopplers 03/2016 1-59%  . Seizure disorder (HCC)    as sequela of small intracerebral bleed  . Seizures (HCC)     No past surgical history on file.  Family History  Problem Relation Age of Onset  . Diabetes Mother   . Heart disease Mother   . Hypertension Mother   . Heart disease Father   . Heart attack Father   . Hypertension Father   . Hypertension Maternal Grandmother   . Hypertension Paternal Grandmother     Social History:  reports that he has been smoking Cigarettes.  He has been smoking about 0.25 packs per day. He has never used smokeless tobacco. He reports that he drinks about 0.6 oz of alcohol per week . He reports that he does not use  drugs.  Allergies: No Known Allergies  Allergies as of 08/16/2016   No Known Allergies     Medication List       Accurate as of 08/16/16 11:00 AM. Always use your most recent med list.          aspirin EC 81 MG tablet Take 1 tablet (81 mg total) by mouth daily.   atorvastatin 40 MG tablet Commonly known as:  LIPITOR Take 1 tablet (40 mg total) by mouth daily.   irbesartan 150 MG tablet Commonly known as:  AVAPRO Take 1 tablet (150 mg total) by mouth daily.   labetalol 200 MG tablet Commonly known as:  NORMODYNE Take 200 mg by mouth 3 (three) times daily.   levETIRAcetam 500 MG tablet Commonly known as:  KEPPRA Take 1 tablet (500 mg total) by mouth 2 (two) times daily.     nicotine 14 mg/24hr patch Commonly known as:  NICODERM CQ - dosed in mg/24 hours Place 1 patch (14 mg total) onto the skin daily.   potassium chloride SA 20 MEQ tablet Commonly known as:  K-DUR,KLOR-CON Take 1 tablet (20 mEq total) by mouth 3 (three) times daily.       LABS:  No visits with results within 1 Week(s) from this visit.  Latest known visit with results is:  Hospital Outpatient Visit on 07/19/2016  Component Date Value Ref Range Status  . 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 Blood pressure is poorly controlled currently partly because of his not taking doxazosin Most likely needs Aldactone after his evaluation is done  He has been recommended a CT scan For his adrenal gland to rule out adenoma but he has not gone to do this yet Also discussed that if he has a single adenoma may consider surgery   PLAN:    He will need to reschedule his CT scan of the adrenal glands  Check potassium level today  Restart doxazosin with 4 mg tonight and tomorrow and then go up to 8 mg  He will need to use a home monitor to check his blood pressure regularly  There are no Patient Instructions on file for this visit.   Jillene Wehrenberg 08/16/2016, 11:00 AM

## 2016-08-17 ENCOUNTER — Other Ambulatory Visit: Payer: Self-pay | Admitting: Endocrinology

## 2016-08-17 MED ORDER — SPIRONOLACTONE 25 MG PO TABS
25.0000 mg | ORAL_TABLET | Freq: Every day | ORAL | 3 refills | Status: DC
Start: 2016-08-17 — End: 2016-09-19

## 2016-08-17 MED FILL — SPIRONOLACTONE 25 MG TABLET: 25 | 30 days supply | Qty: 30 | Fill #0

## 2016-08-20 ENCOUNTER — Other Ambulatory Visit: Payer: Self-pay

## 2016-08-20 MED ORDER — POTASSIUM CHLORIDE CRYS ER 20 MEQ PO TBCR: 20.0000 meq | EXTENDED_RELEASE_TABLET | Freq: Three times a day (TID) | ORAL | 3 refills | Status: DC

## 2016-08-21 ENCOUNTER — Telehealth: Payer: Self-pay

## 2016-08-21 MED FILL — POTASSIUM CL ER 20 MEQ TABL: 20 | 30 days supply | Qty: 90 | Fill #0

## 2016-08-21 NOTE — Telephone Encounter (Signed)
I tried to contact Steven Higgins today to let him know that his FMLA paperwork is ready for pickup I was unable to leave him a message since he does not have vm set up on his phone- if patient calls back he can either pick it up, I can mail it to him, or we can fax it wherever he wants it to go I just need that information

## 2016-08-22 MED FILL — ATORVASTATIN 40 MG TABLET: 40 | 30 days supply | Qty: 30 | Fill #1

## 2016-09-13 ENCOUNTER — Other Ambulatory Visit: Payer: Self-pay

## 2016-09-13 ENCOUNTER — Telehealth: Payer: Self-pay | Admitting: Endocrinology

## 2016-09-13 ENCOUNTER — Other Ambulatory Visit: Payer: Self-pay | Admitting: Endocrinology

## 2016-09-13 DIAGNOSIS — I10 Essential (primary) hypertension: Secondary | ICD-10-CM

## 2016-09-13 NOTE — Telephone Encounter (Signed)
Spoke to the patient and he wants to know where he is supposed to schedule the CT and why his labs were not in when he went to the lab today- please advise

## 2016-09-13 NOTE — Telephone Encounter (Signed)
Have ordered his CT scan and please check with referral coordinator who does this, we were waiting for his CT report before ordering labs.  Usually done by [facility] imaging but not clear about his insurance status

## 2016-09-13 NOTE — Telephone Encounter (Signed)
[facility] called stating labs for patient are not in. Patient is there now. Please place orders for labs.

## 2016-09-13 NOTE — Telephone Encounter (Signed)
The patient was supposed to get a CT scan of his abdomen before coming to see me.  Please ask him to schedule this.  Lab order has been placed also

## 2016-09-14 NOTE — Telephone Encounter (Signed)
Could you please advise on this? Thank you!!!

## 2016-09-18 ENCOUNTER — Other Ambulatory Visit: Payer: Self-pay | Admitting: Endocrinology

## 2016-09-18 ENCOUNTER — Other Ambulatory Visit (INDEPENDENT_AMBULATORY_CARE_PROVIDER_SITE_OTHER): Payer: Self-pay

## 2016-09-18 DIAGNOSIS — I10 Essential (primary) hypertension: Secondary | ICD-10-CM

## 2016-09-18 LAB — BASIC METABOLIC PANEL
BUN: 12 mg/dL (ref 6–23)
CALCIUM: 9.6 mg/dL (ref 8.4–10.5)
CO2: 30 mEq/L (ref 19–32)
Chloride: 102 mEq/L (ref 96–112)
Creatinine, Ser: 1.17 mg/dL (ref 0.40–1.50)
GFR: 86.96 mL/min (ref >60.00)
GLUCOSE: 114 mg/dL — AB (ref 70–99)
Potassium: 3.5 mEq/L (ref 3.5–5.1)
Sodium: 137 mEq/L (ref 135–145)

## 2016-09-19 ENCOUNTER — Ambulatory Visit (INDEPENDENT_AMBULATORY_CARE_PROVIDER_SITE_OTHER): Payer: Self-pay | Admitting: Endocrinology

## 2016-09-19 ENCOUNTER — Encounter: Payer: Self-pay | Admitting: Endocrinology

## 2016-09-19 VITALS — BP 180/100 | HR 84 | Ht 78.0 in | Wt 222.0 lb

## 2016-09-19 DIAGNOSIS — I10 Essential (primary) hypertension: Secondary | ICD-10-CM

## 2016-09-19 MED ORDER — SPIRONOLACTONE 100 MG PO TABS: 100.0000 mg | ORAL_TABLET | Freq: Every day | ORAL | 2 refills | Status: DC

## 2016-09-19 MED ORDER — DOXAZOSIN MESYLATE 8 MG PO TABS: 8.0000 mg | ORAL_TABLET | Freq: Every day | ORAL | 1 refills | Status: DC

## 2016-09-19 MED ORDER — LABETALOL HCL 300 MG PO TABS: 300.0000 mg | ORAL_TABLET | Freq: Two times a day (BID) | ORAL | 2 refills | Status: DC

## 2016-09-19 MED FILL — SPIRONOLACTONE 100 MG TABS: 100 | 30 days supply | Qty: 30 | Fill #0

## 2016-09-19 MED FILL — LABETALOL HCL 300 MG TABLET: 300 | 30 days supply | Qty: 60 | Fill #0

## 2016-09-19 MED FILL — DOXAZOSIN MESYLATE 8 MG TAB: 8 | 30 days supply | Qty: 30 | Fill #0

## 2016-09-19 NOTE — Patient Instructions (Addendum)
Sprinolactone 100mg  daily  Restart labetalol and Doxazosin and keep Irbesartan

## 2016-09-19 NOTE — Progress Notes (Signed)
Patient ID: Steven Higgins, male   DOB: 06/05/1970, 46 y.o.   MRN: [id number]              Chief complaint: High blood pressure  History of Present Illness:  Background information: His hypertension was diagnosed about 10 or 11 years ago and details of his diagnosis and treatment are unknown He had been treated for this for several years but about 2 years ago he decided to stop his medications on his own especially because he was getting cough from lisinopril. He did not have any follow-up for his hypertension even though he thinks his blood pressure was running around 180 He was admitted to the hospital in 03/2016 with than intracerebral hemorrhage and his blood pressure was markedly increased at that time.  He was treated aggressively and blood pressure apparently improved with regimen of amlodipine, labetalol and lisinopril  He was also found to have persistent HYPOKALEMIA in the hospital with potassium levels as low as 2.5   Magnesium level was low normal at 1.7  Recent history:  On his initial evaluation 24-hour urine potassium was ordered and this was significantly high at 57 despite having hypokalemia SALT LOADING test: His urine aldosterone was high at 30.6 and urine sodium was 251 on the second attempt  Recently he is taking spironolactone 25 mg daily since late July when he was seen Taking 2 potassium pills a day  Finally his potassium is normal at 3.5   His blood pressure is significantly high again Review of his medications and indicates that he is irregular with his refills and has not taken labetalol Also ran out of doxazosin at least a couple of days ago and says he does not have a refill His continuing to take Avapro 150 mg daily  BP Readings from Last 3 Encounters:  09/19/16 (!) 180/100  08/16/16 (!) 174/94  07/30/16 (!) 170/90    Lab Results  Component Value Date   CREATININE 1.17 09/18/2016   BUN 12 09/18/2016   NA 137 09/18/2016   K 3.5 09/18/2016   CL  102 09/18/2016   CO2 30 09/18/2016      Past Medical History:  Diagnosis Date  . Carotid artery stenosis 04/04/2016   1-39% bilateral by dopplers 03/2016  . DCM (dilated cardiomyopathy) (HCC)    EF 40-45% by echo 03/2016 presumed due to HTN  . Hyperlipidemia LDL goal <70   . Hypertensive heart disease with CHF (HCC)   . 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 Renal artery stenosis (HCC) 04/04/2016   By renal dopplers 03/2016 1-59%  . Seizure disorder (HCC)    as sequela of small intracerebral bleed  . Seizures (HCC)     No past surgical history on file.  Family History  Problem Relation Age of Onset  . Diabetes Mother   . Heart disease Mother   . Hypertension Mother   . Heart disease Father   . Heart attack Father   . Hypertension Father   . Hypertension Maternal Grandmother   . Hypertension Paternal Grandmother     Social History:  reports that he has been smoking Cigarettes.  He has been smoking about 0.25 packs per day. He has never used smokeless tobacco. He reports that he drinks about 0.6 oz of alcohol per week . He reports that he does not use drugs.  Allergies: No Known Allergies  Allergies as of 09/19/2016   No Known Allergies     Medication  List       Accurate as of 09/19/16 10:28 AM. Always use your most recent med list.          aspirin EC 81 MG tablet Take 1 tablet (81 mg total) by mouth daily.   atorvastatin 40 MG tablet Commonly known as:  LIPITOR Take 1 tablet (40 mg total) by mouth daily.   doxazosin 8 MG tablet Commonly known as:  CARDURA Take 1 tablet (8 mg total) by mouth daily.   irbesartan 150 MG tablet Commonly known as:  AVAPRO Take 1 tablet (150 mg total) by mouth daily.   labetalol 300 MG tablet Commonly known as:  NORMODYNE Take 1 tablet (300 mg total) by mouth 2 (two) times daily.   levETIRAcetam 500 MG tablet Commonly known as:  KEPPRA Take 1 tablet (500 mg total) by mouth 2 (two) times daily.   nicotine 14  mg/24hr patch Commonly known as:  NICODERM CQ - dosed in mg/24 hours Place 1 patch (14 mg total) onto the skin daily.   spironolactone 100 MG tablet Commonly known as:  ALDACTONE Take 1 tablet (100 mg total) by mouth daily.            Discharge Care Instructions        Start     Ordered   09/19/16 0000  spironolactone (ALDACTONE) 100 MG tablet  Daily     09/19/16 1005   09/19/16 0000  doxazosin (CARDURA) 8 MG tablet  Daily     09/19/16 1005   09/19/16 0000  labetalol (NORMODYNE) 300 MG tablet  2 times daily     09/19/16 1005   09/19/16 0000  Basic metabolic panel     09/19/16 1005      LABS:  Lab on 09/18/2016  Component Date Value Ref Range Status  . Sodium 09/18/2016 137  135 - 145 mEq/L Final  . Potassium 09/18/2016 3.5  3.5 - 5.1 mEq/L Final  . Chloride 09/18/2016 102  96 - 112 mEq/L Final  . CO2 09/18/2016 30  19 - 32 mEq/L Final  . Glucose, Bld 09/18/2016 114* 70 - 99 mg/dL Final  . BUN 26/41/5830 12  6 - 23 mg/dL Final  . Creatinine, Ser 09/18/2016 1.17  0.40 - 1.50 mg/dL Final  . Calcium 94/07/6806 9.6  8.4 - 10.5 mg/dL Final  . 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  Seizure: He will discuss this with his neurologist as he thinks he did  not actually have a seizure at home before his admission   PLAN:    He will need to schedule his CT scan of the adrenal glands and given him to contact  information to do this  Restart labetalol and doxazosin, will use 300 twice a day of the labetalol for convenience  Increase ALDACTONE up to 100 mg and stop potassium  Continue irbesartan  Follow-up in 1 month   Patient Instructions  Sprinolactone  daily  Restart labetalol and Doxazosin and keep Irbesartan    Mattilynn Forrer 09/19/2016, 10:28 AM

## 2016-09-21 ENCOUNTER — Ambulatory Visit
Admission: RE | Admit: 2016-09-21 | Discharge: 2016-09-21 | Disposition: A | Discharge status: Home / Self Care | Payer: No Typology Code available for payment source | Source: Ambulatory Visit | Attending: Endocrinology | Admitting: Endocrinology

## 2016-09-21 DIAGNOSIS — E2609 Other primary hyperaldosteronism: Secondary | ICD-10-CM

## 2016-09-21 MED ORDER — IOHEXOL 300 MG/ML  SOLN: 20.0000 mL | Freq: Once | INTRAMUSCULAR | Status: DC | PRN

## 2016-09-25 NOTE — Progress Notes (Signed)
Please call to let patient know that the CT scan does not show any adrenal gland enlargement: Will continue medications as prescribed

## 2016-09-27 ENCOUNTER — Other Ambulatory Visit: Payer: Self-pay | Admitting: *Deleted

## 2016-09-27 DIAGNOSIS — E785 Hyperlipidemia, unspecified: Secondary | ICD-10-CM

## 2016-09-27 LAB — LIPID PANEL
CHOL/HDL RATIO: 2.7 ratio (ref 0.0–5.0)
Cholesterol, Total: 135 mg/dL (ref 100–199)
HDL: 50 mg/dL (ref >39)
LDL CALC: 68 mg/dL (ref 0–99)
TRIGLYCERIDES: 87 mg/dL (ref 0–149)
VLDL Cholesterol Cal: 17 mg/dL (ref 5–40)

## 2016-09-27 LAB — ALT: ALT: 17 IU/L (ref 0–44)

## 2016-10-09 MED FILL — levETIRAcetam 500 MG TABS: 500 | 30 days supply | Qty: 60 | Fill #1

## 2016-10-15 ENCOUNTER — Other Ambulatory Visit (INDEPENDENT_AMBULATORY_CARE_PROVIDER_SITE_OTHER): Payer: Self-pay

## 2016-10-15 DIAGNOSIS — I10 Essential (primary) hypertension: Secondary | ICD-10-CM

## 2016-10-15 LAB — BASIC METABOLIC PANEL
BUN: 14 mg/dL (ref 6–23)
CALCIUM: 9.8 mg/dL (ref 8.4–10.5)
CO2: 29 meq/L (ref 19–32)
Chloride: 101 mEq/L (ref 96–112)
Creatinine, Ser: 1.37 mg/dL (ref 0.40–1.50)
GFR: 72.45 mL/min (ref >60.00)
Glucose, Bld: 127 mg/dL — ABNORMAL HIGH (ref 70–99)
Potassium: 3.5 mEq/L (ref 3.5–5.1)
SODIUM: 138 meq/L (ref 135–145)

## 2016-10-17 ENCOUNTER — Ambulatory Visit (INDEPENDENT_AMBULATORY_CARE_PROVIDER_SITE_OTHER): Payer: Self-pay | Admitting: Endocrinology

## 2016-10-17 ENCOUNTER — Encounter: Payer: Self-pay | Admitting: Endocrinology

## 2016-10-17 VITALS — BP 165/92 | HR 72 | Ht 78.0 in | Wt 220.4 lb

## 2016-10-17 DIAGNOSIS — I10 Essential (primary) hypertension: Secondary | ICD-10-CM

## 2016-10-17 MED ORDER — OLMESARTAN MEDOXOMIL 40 MG PO TABS: 40.0000 mg | ORAL_TABLET | Freq: Every day | ORAL | 2 refills | Status: DC

## 2016-10-17 MED FILL — OLMESARTAN MEDOXOMIL 40 MG: 40 | 30 days supply | Qty: 30 | Fill #0

## 2016-10-17 NOTE — Progress Notes (Signed)
Patient ID: Steven Higgins, male   DOB: [birth year], 46 y.o.   MRN: [id number]              Chief complaint: High blood pressure  History of Present Illness:  Background information: His hypertension was diagnosed about 10 or 11 years ago and details of his diagnosis and treatment are unknown He had been treated for this for several years but about 2 years ago he decided to stop his medications on his own especially because he was getting cough from lisinopril. He did not have any follow-up for his hypertension even though he thinks his blood pressure was running around 180 He was admitted to the hospital in 03/2016 with than intracerebral hemorrhage and his blood pressure was markedly increased at that time.  He was treated aggressively and blood pressure apparently improved with regimen of amlodipine, labetalol and lisinopril  He was also found to have persistent HYPOKALEMIA in the hospital with potassium levels as low as 2.5   Magnesium level was low normal at 1.7  Recent history:  On his initial evaluation 24-hour urine potassium was ordered and this was significantly high at 57 despite having hypokalemia SALT LOADING test: His urine aldosterone was high at 30.6 and urine sodium was 251 on the second attempt  However his CT scan of the adrenal gland did not show any abnormality bilaterally  Recently he is taking spironolactone 100 mg daily His potassium was stopped again his potassium is normal at 3.5   His blood pressure is high again Review of his medications and indicates that he is irregular with his refills and has not taken Avapro He thinks he may have run out of this 2 weeks ago He says he does have trouble remembering but does not help to have a pillbox either  However he says that his blood pressure at home is between 1 40-150 frequently and mostly around 90 or below diastolic He is using an arm instrument from [employer]  BP Readings from Last 3 Encounters:  10/17/16 (!)  165/92  09/19/16 (!) 180/100  08/16/16 (!) 174/94    Lab Results  Component Value Date   CREATININE 1.37 10/15/2016   BUN 14 10/15/2016   NA 138 10/15/2016   K 3.5 10/15/2016   CL 101 10/15/2016   CO2 29 10/15/2016      Past Medical History:  Diagnosis Date  . Carotid artery stenosis 04/04/2016   1-39% bilateral by dopplers 03/2016  . DCM (dilated cardiomyopathy) (HCC)    EF 40-45% by echo 03/2016 presumed due to HTN  . Hyperlipidemia LDL goal <70   . Hypertensive heart disease with CHF (HCC)   . 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 Renal artery stenosis (HCC) 04/04/2016   By renal dopplers 03/2016 1-59%  . Seizure disorder (HCC)    as sequela of small intracerebral bleed  . Seizures (HCC)     No past surgical history on file.  Family History  Problem Relation Age of Onset  . Diabetes Mother   . Heart disease Mother   . Hypertension Mother   . Heart disease Father   . Heart attack Father   . Hypertension Father   . Hypertension Maternal Grandmother   . Hypertension Paternal Grandmother     Social History:  reports that he has been smoking Cigarettes.  He has been smoking about 0.25 packs per day. He has never used smokeless tobacco. He reports that he drinks about 0.6  oz of alcohol per week . He reports that he does not use drugs.  Allergies: No Known Allergies  Allergies as of 10/17/2016   No Known Allergies     Medication List       Accurate as of 10/17/16  1:47 PM. Always use your most recent med list.          aspirin EC 81 MG tablet Take 1 tablet (81 mg total) by mouth daily.   atorvastatin 40 MG tablet Commonly known as:  LIPITOR Take 1 tablet (40 mg total) by mouth daily.   doxazosin 8 MG tablet Commonly known as:  CARDURA Take 1 tablet (8 mg total) by mouth daily.   labetalol 300 MG tablet Commonly known as:  NORMODYNE Take 1 tablet (300 mg total) by mouth 2 (two) times daily.   levETIRAcetam 500 MG tablet Commonly  known as:  KEPPRA Take 1 tablet (500 mg total) by mouth 2 (two) times daily.   nicotine 14 mg/24hr patch Commonly known as:  NICODERM CQ - dosed in mg/24 hours Place 1 patch (14 mg total) onto the skin daily.   olmesartan 40 MG tablet Commonly known as:  BENICAR Take 1 tablet (40 mg total) by mouth daily.   spironolactone 100 MG tablet Commonly known as:  ALDACTONE Take 1 tablet (100 mg total) by mouth daily.       LABS:  Lab on 10/15/2016  Component Date Value Ref Range Status  . Sodium 10/15/2016 138  135 - 145 mEq/L Final  . Potassium 10/15/2016 3.5  3.5 - 5.1 mEq/L Final  . Chloride 10/15/2016 101  96 - 112 mEq/L Final  . CO2 10/15/2016 29  19 - 32 mEq/L Final  . Glucose, Bld 10/15/2016 127* 70 - 99 mg/dL Final  . BUN 60/10/9321 14  6 - 23 mg/dL Final  . Creatinine, Ser 10/15/2016 1.37  0.40 - 1.50 mg/dL Final  . Calcium 55/73/2202 9.8  8.4 - 10.5 mg/dL Final  . 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

## 2016-10-17 NOTE — Patient Instructions (Signed)
Bring home BP meter

## 2016-10-22 MED FILL — SPIRONOLACTONE 100 MG TABS: 100 | 30 days supply | Qty: 30 | Fill #1

## 2016-10-22 MED FILL — DOXAZOSIN MESYLATE 8 MG TAB: 8 | 30 days supply | Qty: 30 | Fill #1

## 2016-10-23 ENCOUNTER — Telehealth: Payer: Self-pay | Admitting: *Deleted

## 2016-10-23 ENCOUNTER — Telehealth: Payer: Self-pay | Admitting: Neurology

## 2016-10-23 ENCOUNTER — Telehealth: Payer: Self-pay | Admitting: Endocrinology

## 2016-10-23 NOTE — Telephone Encounter (Signed)
This is not about the seizure medication for you, this is about the blood pressure medicine from you because social services is asking about these medications also.  Please advise.

## 2016-10-23 NOTE — Telephone Encounter (Signed)
Social Services has questions re his health. Please call patient to discuss and advise ph# [phone]

## 2016-10-23 NOTE — Telephone Encounter (Signed)
Steven Higgins  You 32 minutes ago (10:50 AM)    Pt states Dr Pearlean Brownie instructed him not to drive or work. Pt would like a letter written stating his condition & limitations with his medications. Pt stated that social services is requesting this information so pt can apply for medicaid. (Routing comment)

## 2016-10-23 NOTE — Telephone Encounter (Signed)
Called patient and he stated that he was sent to [facility]. He stated that they asked him questions that he could not answer. They asked if he was disabled because of his seizure. He is asking if we can send a letter stating he is not able to work due to the medication that he is taking and his blood pressure is not coming down. He stated that he is better with the medication but he still gets confused and still gets sick and they keep asking him if he is disabled. They are wanting to know what is wrong with him and he is not sure what to tell Social Services.  Please advise.

## 2016-10-23 NOTE — Telephone Encounter (Signed)
Letter done at front desk

## 2016-10-23 NOTE — Telephone Encounter (Signed)
Called patient and he stated that since you are prescribing these medications the Social Security people that he has spoken with stated they need you to send a letter as to why he is taking the prescriptions and if he is able to safely drive.  Please advise.

## 2016-10-23 NOTE — Telephone Encounter (Signed)
He has to go through his neurologist for these issues

## 2016-10-23 NOTE — Telephone Encounter (Signed)
Attempted to call pt, could not LVM.  (letter for work).

## 2016-10-23 NOTE — Telephone Encounter (Signed)
Rn call patient that Dr. Pearlean Brownie stated he just could not dirve. Rn stated physically he can work with no limitations. Rn stated he will have to seek PCP about his medication letter. PT stated he was last seen 05/09/2016. Letter can be ready tomorrow saying he can start driving on 52/08/4130 if he has been seizure free. Pt stated he has not had any seizures since May 2018.

## 2016-10-23 NOTE — Telephone Encounter (Signed)
I am not prescribing his seizure medication and cannot certify safety with driving

## 2016-10-23 NOTE — Telephone Encounter (Signed)
Rn spoke with Dr. Pearlean Brownie about pt wanting letter. Rn stated pt is able to work physically, he just can't drive until he has 6 months seizure free. PEr Dr. Pearlean Brownie he never stated pt cannot work.

## 2016-10-24 NOTE — Telephone Encounter (Signed)
Rn call patient that letter at front desk for pick up.Pt verbalized understanding.

## 2016-10-25 NOTE — Telephone Encounter (Signed)
Given to Myrtha Mantis, Charity fundraiser as she had spoken with pt.

## 2016-11-01 ENCOUNTER — Telehealth: Payer: Self-pay | Admitting: Endocrinology

## 2016-11-01 ENCOUNTER — Encounter: Payer: Self-pay | Admitting: Endocrinology

## 2016-11-01 ENCOUNTER — Other Ambulatory Visit: Payer: Self-pay

## 2016-11-01 MED ORDER — CARVEDILOL 25 MG PO TABS: 25.0000 mg | ORAL_TABLET | Freq: Two times a day (BID) | ORAL | 3 refills | Status: DC

## 2016-11-01 NOTE — Telephone Encounter (Signed)
Caller Name: Elease Hashimoto  Relationship to Patient: [facility]  Best Number: [phone]  Pharmacy: [facility]  Reason for call:  Patient is a cash pay patient and the  labetalol (NORMODYNE) 300 MG tablet has had a significant price increase.  The pharmacist is suggesting that metoprolol  is a less expensive replacement product, depending on what Dr. Lucianne Muss is prescribing for.  Please call pharmacist with possible substitute.

## 2016-11-01 NOTE — Telephone Encounter (Signed)
Called patient and let him know that the letter is ready for him to pick up at the front desk. He stated that he will come to pick up as soon as he could.

## 2016-11-01 NOTE — Telephone Encounter (Signed)
Letter has been printed  ?

## 2016-11-01 NOTE — Telephone Encounter (Signed)
Please check if carvedilol 25 mg twice a day would be affordable for him otherwise metoprolol ER 100 mg daily

## 2016-11-01 NOTE — Telephone Encounter (Signed)
I called the [facility] and spoke to [place] and he stated that the Carvedilol 25 mg BID was a better option price wise so I have sent in a new prescription for this medication for the patient.

## 2016-11-02 MED FILL — CARVEDILOL 25 MG TABLET: 25 | 30 days supply | Qty: 60 | Fill #0

## 2016-11-06 MED FILL — IRBESARTAN 150 MG TABLET: 150 | 90 days supply | Qty: 90 | Fill #1

## 2016-11-26 MED FILL — DOXAZOSIN MESYLATE 8 MG TAB: 8 | 30 days supply | Qty: 30 | Fill #1

## 2016-11-26 MED FILL — levETIRAcetam 500 MG TABS: 500 | 30 days supply | Qty: 60 | Fill #2

## 2016-11-26 MED FILL — SPIRONOLACTONE 100 MG TABS: 100 | 30 days supply | Qty: 30 | Fill #2

## 2016-11-30 ENCOUNTER — Ambulatory Visit: Payer: Self-pay | Admitting: Endocrinology

## 2016-12-04 ENCOUNTER — Encounter: Payer: Self-pay | Admitting: Endocrinology

## 2016-12-04 ENCOUNTER — Ambulatory Visit (INDEPENDENT_AMBULATORY_CARE_PROVIDER_SITE_OTHER): Payer: Self-pay | Admitting: Endocrinology

## 2016-12-04 VITALS — BP 152/98 | HR 72 | Ht 78.0 in | Wt 216.6 lb

## 2016-12-04 DIAGNOSIS — I10 Essential (primary) hypertension: Secondary | ICD-10-CM

## 2016-12-04 MED ORDER — IRBESARTAN 300 MG PO TABS: 300.0000 mg | ORAL_TABLET | Freq: Every day | ORAL | 2 refills | Status: DC

## 2016-12-04 NOTE — Progress Notes (Signed)
Patient ID: Steven Higgins, male   DOB: [birth year], 46 y.o.   MRN: [id number]              Chief complaint: High blood pressure  History of Present Illness:  Background information: His hypertension was diagnosed about 10 or 11 years ago and details of his diagnosis and treatment are unknown He had been treated for this for several years but about 2 years ago he decided to stop his medications on his own especially because he was getting cough from lisinopril. He did not have any follow-up for his hypertension even though he thinks his blood pressure was running around 180 He was admitted to the hospital in 03/2016 with than intracerebral hemorrhage and his blood pressure was markedly increased at that time.  He was treated aggressively and blood pressure apparently improved with regimen of amlodipine, labetalol and lisinopril  He was also found to have persistent HYPOKALEMIA in the hospital with potassium levels as low as 2.5   Magnesium level was low normal at 1.7  CURRENT history:  On his initial evaluation 24-hour urine potassium was ordered and this was significantly high at 57 despite having hypokalemia SALT LOADING test: His urine aldosterone was high at 30.6 and urine sodium was 251 on the second attempt However his CT scan of the adrenal gland did not show any abnormality bilaterally  Subsequently he is taking spironolactone 100 mg daily His potassium was stopped and last potassium was 3.5  He has had difficulty keeping up with his medications and has had some financial difficulties also He is now taking carvedilol instead of labetalol possibly because of higher cost Also he was told to take Benicar instead of Avapro but he thinks he is still taking the 150 mg Avapro He did not bring his medications for review He probably is taking his medication regularly recently although not clear why his blood pressure is not controlled again  However he says that his blood pressure at  home is between 130-170, frequently below 150 systolic and mostly 90 or below diastolic He is using an arm instrument from [employer]  Blood pressure was unchanged on second measurement today  BP Readings from Last 3 Encounters:  12/04/16 (!) 152/98  10/17/16 (!) 165/92  09/19/16 (!) 180/100   He is asking about his looking like he has gained weight although he has actually dropped pounds  Wt Readings from Last 3 Encounters:  12/04/16 216 lb 9.6 oz (98.2 kg)  10/17/16 220 lb 6.4 oz (100 kg)  09/19/16 222 lb (100.7 kg)    Lab Results  Component Value Date   CREATININE 1.37 10/15/2016   BUN 14 10/15/2016   NA 138 10/15/2016   K 3.5 10/15/2016   CL 101 10/15/2016   CO2 29 10/15/2016      Past Medical History:  Diagnosis Date  . Carotid artery stenosis 04/04/2016   1-39% bilateral by dopplers 03/2016  . DCM (dilated cardiomyopathy) (HCC)    EF 40-45% by echo 03/2016 presumed due to HTN  . Hyperlipidemia LDL goal <70   . Hypertensive heart disease with CHF (HCC)   . 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 Renal artery stenosis (HCC) 04/04/2016   By renal dopplers 03/2016 1-59%  . Seizure disorder (HCC)    as sequela of small intracerebral bleed  . Seizures (HCC)     No past surgical history on file.  Family History  Problem Relation Age of Onset  .  Diabetes Mother   . Heart disease Mother   . Hypertension Mother   . Heart disease Father   . Heart attack Father   . Hypertension Father   . Hypertension Maternal Grandmother   . Hypertension Paternal Grandmother     Social History:  reports that he has been smoking cigarettes.  He has been smoking about 0.25 packs per day. he has never used smokeless tobacco. He reports that he drinks about 0.6 oz of alcohol per week. He reports that he does not use drugs.  Allergies: No Known Allergies  Allergies as of 12/04/2016   No Known Allergies     Medication List        Accurate as of 12/04/16 11:21 AM. Always  use your most recent med list.          aspirin EC 81 MG tablet Take 1 tablet (81 mg total) by mouth daily.   atorvastatin 40 MG tablet Commonly known as:  LIPITOR Take 1 tablet (40 mg total) by mouth daily.   carvedilol 25 MG tablet Commonly known as:  COREG Take 1 tablet (25 mg total) by mouth 2 (two) times daily with a meal.   doxazosin 8 MG tablet Commonly known as:  CARDURA Take 1 tablet (8 mg total) by mouth daily.   irbesartan 150 MG tablet Commonly known as:  AVAPRO TAKE 1 TABLET  150 MG TOTAL  BY MOUTH DAILY   labetalol 300 MG tablet Commonly known as:  NORMODYNE Take 1 tablet (300 mg total) by mouth 2 (two) times daily.   levETIRAcetam 500 MG tablet Commonly known as:  KEPPRA Take 1 tablet (500 mg total) by mouth 2 (two) times daily.   nicotine 14 mg/24hr patch Commonly known as:  NICODERM CQ - dosed in mg/24 hours Place 1 patch (14 mg total) onto the skin daily.   olmesartan 40 MG tablet Commonly known as:  BENICAR Take 1 tablet (40 mg total) by mouth daily.   spironolactone 100 MG tablet Commonly known as:  ALDACTONE Take 1 tablet (100 mg total) by mouth daily.       LABS:  No visits with results within 1 Week(s) from this visit.  Latest known visit with results is:  Lab on 10/15/2016  Component Date Value Ref Range Status  . Sodium 10/15/2016 138  135 - 145 mEq/L Final  . Potassium 10/15/2016 3.5  3.5 - 5.1 mEq/L Final  . Chloride 10/15/2016 101  96 - 112 mEq/L Final  . CO2 10/15/2016 29  19 - 32 mEq/L Final  . Glucose, Bld 10/15/2016 127* 70 - 99 mg/dL Final  . BUN 16/10/9602 14  6 - 23 mg/dL Final  . Creatinine, Ser 10/15/2016 1.37  0.40 - 1.50 mg/dL Final  . Calcium 54/09/8117 9.8  8.4 - 10.5 mg/dL Final  . 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 without abnormality on CT of the adrenals  Blood pressure is appearing not controlled again even though he thinks his blood pressures are mostly fairly good at home with unknown monitor which he did not bring for comparison today He also thinks he  has been taking his medications regularly as directed Currently taking 150 mg of Avapro, most likely cannot afford Benicar Hypokalemia has been controlled with Aldactone   PLAN:   He will start taking 300 mg of Avapro He will bring his monitor for comparison on the next visit Also needs to make sure he is getting his refills consistently Consider using clonidine patch of blood pressure not controlled  Recheck electrolytes on next visit   There are no Patient Instructions on file for this visit.   Americo Vallery 12/04/2016, 11:21 AM

## 2016-12-04 NOTE — Patient Instructions (Signed)
No Lipitor

## 2016-12-31 ENCOUNTER — Other Ambulatory Visit: Payer: Self-pay | Admitting: Endocrinology

## 2016-12-31 MED FILL — IRBESARTAN 300 MG TABLET: 300 | 30 days supply | Qty: 30 | Fill #0

## 2016-12-31 MED FILL — levETIRAcetam 500 MG TABS: 500 | 30 days supply | Qty: 60 | Fill #3

## 2016-12-31 MED FILL — DOXAZOSIN MESYLATE 8 MG TAB: 8 | 30 days supply | Qty: 30 | Fill #1

## 2017-01-14 ENCOUNTER — Other Ambulatory Visit (INDEPENDENT_AMBULATORY_CARE_PROVIDER_SITE_OTHER): Payer: Self-pay

## 2017-01-14 DIAGNOSIS — I10 Essential (primary) hypertension: Secondary | ICD-10-CM

## 2017-01-14 LAB — BASIC METABOLIC PANEL
BUN: 11 mg/dL (ref 6–23)
CALCIUM: 9.5 mg/dL (ref 8.4–10.5)
CHLORIDE: 99 meq/L (ref 96–112)
CO2: 33 meq/L — AB (ref 19–32)
Creatinine, Ser: 1.21 mg/dL (ref 0.40–1.50)
GFR: 83.52 mL/min (ref >60.00)
Glucose, Bld: 128 mg/dL — ABNORMAL HIGH (ref 70–99)
Potassium: 3.4 mEq/L — ABNORMAL LOW (ref 3.5–5.1)
SODIUM: 137 meq/L (ref 135–145)

## 2017-01-17 ENCOUNTER — Encounter: Payer: Self-pay | Admitting: Endocrinology

## 2017-01-17 ENCOUNTER — Ambulatory Visit (INDEPENDENT_AMBULATORY_CARE_PROVIDER_SITE_OTHER): Payer: Self-pay | Admitting: Endocrinology

## 2017-01-17 VITALS — BP 168/90 | HR 81 | Temp 98.5°F | Wt 212.0 lb

## 2017-01-17 DIAGNOSIS — I10 Essential (primary) hypertension: Secondary | ICD-10-CM

## 2017-01-17 MED ORDER — SPIRONOLACTONE 100 MG PO TABS: 100.0000 mg | ORAL_TABLET | Freq: Every day | ORAL | 2 refills | Status: DC

## 2017-01-17 MED ORDER — CARVEDILOL 25 MG PO TABS: 25.0000 mg | ORAL_TABLET | Freq: Two times a day (BID) | ORAL | 3 refills | Status: DC

## 2017-01-17 MED FILL — SPIRONOLACTONE 100 MG TABS: 100 | 30 days supply | Qty: 30 | Fill #0

## 2017-01-17 MED FILL — CARVEDILOL 25 MG TABLET: 25 | 30 days supply | Qty: 60 | Fill #0

## 2017-01-17 NOTE — Progress Notes (Signed)
Patient ID: Steven Higgins, male   DOB: Feb 24, 1970, 47 y.o.   MRN: [id number]              Chief complaint: High blood pressure follow-up  History of Present Illness:  Background information: His hypertension was diagnosed about 10 or 11 years ago and details of his diagnosis and treatment are unknown He had been treated for this for several years but about 2 years ago he decided to stop his medications on his own especially because he was getting cough from lisinopril. He did not have any follow-up for his hypertension even though he thinks his blood pressure was running around 180 He was admitted to the hospital in 03/2016 with than intracerebral hemorrhage and his blood pressure was markedly increased at that time.  He was treated aggressively and blood pressure apparently improved with regimen of amlodipine, labetalol and lisinopril  He was also found to have persistent HYPOKALEMIA in the hospital with potassium levels as low as 2.5   Magnesium level was low normal at 1.7  CURRENT history:  On his initial evaluation 24-hour urine potassium was ordered and this was significantly high at 57 despite having hypokalemia SALT LOADING test: His urine aldosterone was high at 30.6 and urine sodium was 251 on the second attempt However his CT scan of the adrenal gland did not show any abnormality bilaterally  Subsequently he is taking spironolactone 100 mg daily without any potassium supplements He has not taken this for probably couple of weeks his potassium is now 3.4  He has had difficulty keeping up with his medications and has had some financial difficulties also He is now taking Generic Avapro 300 mg daily, previously could not Benicar Generic Also labetalol was changed to carvedilol However appears that he is not taking his CARVEDILOL prescription has been sent for refills Currently only taking Avapro and doxazosin  Blood pressure has gone up since his last visit  However he says  that his blood pressure at home is between 150+ systolic, and mostly 80 diastolic   He is using an arm instrument from [employer]  Blood pressure was relatively better on second measurement today  BP Readings from Last 3 Encounters:  01/17/17 (!) 168/90  12/04/16 (!) 152/98  10/17/16 (!) 165/92    No recent weight gain:  Wt Readings from Last 3 Encounters:  01/17/17 212 lb (96.2 kg)  12/04/16 216 lb 9.6 oz (98.2 kg)  10/17/16 220 lb 6.4 oz (100 kg)    Lab Results  Component Value Date   CREATININE 1.21 01/14/2017   BUN 11 01/14/2017   NA 137 01/14/2017   K 3.4 (L) 01/14/2017   CL 99 01/14/2017   CO2 33 (H) 01/14/2017      Past Medical History:  Diagnosis Date  . Carotid artery stenosis 04/04/2016   1-39% bilateral by dopplers 03/2016  . DCM (dilated cardiomyopathy) (HCC)    EF 40-45% by echo 03/2016 presumed due to HTN  . Hyperlipidemia LDL goal <70   . Hypertensive heart disease with CHF (HCC)   . 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 Renal artery stenosis (HCC) 04/04/2016   By renal dopplers 03/2016 1-59%  . Seizure disorder (HCC)    as sequela of small intracerebral bleed  . Seizures (HCC)     No past surgical history on file.  Family History  Problem Relation Age of Onset  . Diabetes Mother   . Heart disease Mother   .  Hypertension Mother   . Heart disease Father   . Heart attack Father   . Hypertension Father   . Hypertension Maternal Grandmother   . Hypertension Paternal Grandmother     Social History:  reports that he has been smoking cigarettes.  He has been smoking about 0.25 packs per day. he has never used smokeless tobacco. He reports that he does not drink alcohol or use drugs.  Allergies: No Known Allergies  Allergies as of 01/17/2017   No Known Allergies     Medication List        Accurate as of 01/17/17 10:36 AM. Always use your most recent med list.          aspirin EC 81 MG tablet Take 1 tablet (81 mg total) by  mouth daily.   carvedilol 25 MG tablet Commonly known as:  COREG Take 1 tablet (25 mg total) by mouth 2 (two) times daily with a meal.   doxazosin 8 MG tablet Commonly known as:  CARDURA Take 1 tablet (8 mg total) by mouth daily.   irbesartan 300 MG tablet Commonly known as:  AVAPRO Take 1 tablet (300 mg total) by mouth daily.   levETIRAcetam 500 MG tablet Commonly known as:  KEPPRA Take 1 tablet (500 mg total) by mouth 2 (two) times daily.   nicotine 14 mg/24hr patch Commonly known as:  NICODERM CQ - dosed in mg/24 hours Place 1 patch (14 mg total) onto the skin daily.   spironolactone 100 MG tablet Commonly known as:  ALDACTONE Take 1 tablet (100 mg total) by mouth daily.       LABS:  Lab on 01/14/2017  Component Date Value Ref Range Status  . Sodium 01/14/2017 137  135 - 145 mEq/L Final  . Potassium 01/14/2017 3.4* 3.5 - 5.1 mEq/L Final  . Chloride 01/14/2017 99  96 - 112 mEq/L Final  . CO2 01/14/2017 33* 19 - 32 mEq/L Final  . Glucose, Bld 01/14/2017 128* 70 - 99 mg/dL Final  . BUN 03/47/4259 11  6 - 23 mg/dL Final  . Creatinine, Ser 01/14/2017 1.21  0.40 - 1.50 mg/dL Final  . Calcium 56/38/7564 9.5  8.4 - 10.5 mg/dL Final  . 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 without abnormality on CT of the adrenals  Blood pressure is  not controlled and this appears to be from his not getting his medications refilled as directed He is not taking his carvedilol and ALDACTONE With this his potassium is also starting to get slightly low now    PLAN:   He will start taking  his carvedilol and Aldactone again, new prescriptions have been sent He will bring his monitor for comparison on the  next visit Also needs to make sure he is getting his refills consistently Consider using clonidine patch of blood pressure not controlled  Recheck electrolytes on next visit   Patient Instructions  Bring BP meter with you    Reather Littler 01/17/2017, 10:36 AM

## 2017-01-17 NOTE — Patient Instructions (Signed)
Bring BP meter with you

## 2017-01-22 ENCOUNTER — Encounter: Payer: Self-pay | Admitting: Cardiology

## 2017-01-22 DIAGNOSIS — I2510 Atherosclerotic heart disease of native coronary artery without angina pectoris: Secondary | ICD-10-CM

## 2017-01-22 HISTORY — DX: Atherosclerotic heart disease of native coronary artery without angina pectoris: I25.10

## 2017-01-22 NOTE — Progress Notes (Signed)
Cardiology Office Note:    Date:  01/23/2017   ID:  Steven Higgins, DOB [birth year], MRN [id number]  PCP:  Steven Littler, MD  Cardiologist:  No primary care provider on file.    Referring MD: Steven Littler, MD   Chief Complaint  Patient presents with  . Hypertension  . Cardiomyopathy  . Coronary Artery Disease  . Hyperlipidemia    History of Present Illness:    Steven Higgins is a 47 y.o. male with a hx of HTN, bilateral carotid artery stenosis 1-39%, renal artery stenosis 1-59% by dopplers, DCM with EF 40-45% felt related to HTN CM, hyperlipidemia and atypical CP with no ischemia on myoview and coronary CTA with coronary Ca score of 143 and mild nonobstructive plaque in the RCA that is tortuous with aneurysmal dilatation and minimal plaque in the LAD and LCx as well as mildly dilated pulmonary artery.  2D echo 03/2016 with no pulmonary HTN.    He is here today for followup and is doing well.  He denies any chest pain or pressure, SOB, DOE, PND, orthopnea,dizziness, palpitations or syncope. He occasionally has some LE edema if he stands too long. He is compliant with his meds and is tolerating meds with no SE.  His BP is elevated today.  He says that it was elevated at Dr. Lucianne Higgins but when he got home is normalized.   Past Medical History:  Diagnosis Date  . CAD (coronary artery disease), native coronary artery 01/22/2017   coronary Ca score of 143 and mild nonobstructive plaque in the RCA that is tortuous with aneurysmal dilatation and minimal plaque in the LAD and LCx by coronary CTA 07/2016  . Carotid artery stenosis 04/04/2016   1-39% bilateral by dopplers 03/2016  . DCM (dilated cardiomyopathy) (HCC)    EF 40-45% by echo 03/2016 presumed due to HTN  . Hyperlipidemia LDL goal <70   . Hypertensive heart disease with CHF (HCC)   . 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 Renal artery stenosis (HCC) 04/04/2016   By renal dopplers 03/2016 1-59%  . Seizure disorder (HCC)    as  sequela of small intracerebral bleed    History reviewed. No pertinent surgical history.  Current Medications: Current Meds  Medication Sig  . aspirin EC 81 MG tablet Take 1 tablet (81 mg total) by mouth daily.  . carvedilol (COREG) 25 MG tablet Take 1 tablet (25 mg total) by mouth 2 (two) times daily with a meal.  . doxazosin (CARDURA) 8 MG tablet Take 1 tablet (8 mg total) by mouth daily.  . irbesartan (AVAPRO) 300 MG tablet Take 1 tablet (300 mg total) by mouth daily.  Marland Kitchen levETIRAcetam (KEPPRA) 500 MG tablet Take 1 tablet (500 mg total) by mouth 2 (two) times daily.  . nicotine (NICODERM CQ - DOSED IN MG/24 HOURS) 14 mg/24hr patch Place 1 patch (14 mg total) onto the skin daily.  Marland Kitchen spironolactone (ALDACTONE) 100 MG tablet Take 1 tablet (100 mg total) by mouth daily.     Allergies:   Patient has no known allergies.   Social History   Socioeconomic History  . Marital status: Single    Spouse name: None  . Number of children: None  . Years of education: None  . Highest education level: None  Social Needs  . Financial resource strain: None  . Food insecurity - worry: None  . Food insecurity - inability: None  . Transportation needs - medical: None  . Transportation  needs - non-medical: None  Occupational History  . None  Tobacco Use  . Smoking status: Current Every Day Smoker    Packs/day: 0.25    Types: Cigarettes  . Smokeless tobacco: Never Used  . Tobacco comment: 07/30/16 smoke 3-4 cigarettes per day-not wearing patch  Substance and Sexual Activity  . Alcohol use: No    Alcohol/week: 0.6 oz    Types: 1 Cans of beer per week    Frequency: Never  . Drug use: No  . Sexual activity: None  Other Topics Concern  . None  Social History Narrative  . None     Family History: The patient's family history includes Diabetes in his mother; Heart attack in his father; Heart disease in his father and mother; Hypertension in his father, maternal grandmother, mother, and  paternal grandmother.  ROS:   Please see the history of present illness.    Review of Systems  Respiratory: Positive for cough and snoring.   Neurological: Positive for headaches.    All other systems reviewed and negative.   EKGs/Labs/Other Studies Reviewed:    The following studies were reviewed today: none  EKG:  EKG is not ordered today.  2  Recent Labs: 03/16/2016: TSH 0.568 03/18/2016: Hemoglobin 13.6; Magnesium 1.7; Platelets 186 09/27/2016: ALT 17 01/14/2017: BUN 11; Creatinine, Ser 1.21; Potassium 3.4; Sodium 137   Recent Lipid Panel    Component Value Date/Time   CHOL 135 09/27/2016 0821   TRIG 87 09/27/2016 0821   HDL 50 09/27/2016 0821   CHOLHDL 2.7 09/27/2016 0821   CHOLHDL 4.3 03/16/2016 0705   VLDL 16 03/16/2016 0705   LDLCALC 68 09/27/2016 0821    Physical Exam:    VS:  BP (!) 178/102 Comment: came here from work  Pulse 61   Ht 6\' 6"  (1.981 m)   Wt 213 lb 3.2 oz (96.7 kg)   SpO2 97%   BMI 24.64 kg/m     Wt Readings from Last 3 Encounters:  01/23/17 213 lb 3.2 oz (96.7 kg)  01/17/17 212 lb (96.2 kg)  12/04/16 216 lb 9.6 oz (98.2 kg)     GEN:  Well nourished, well developed in no acute distress HEENT: Normal NECK: No JVD; No carotid bruits LYMPHATICS: No lymphadenopathy CARDIAC: RRR, no murmurs, rubs, gallops RESPIRATORY:  Clear to auscultation without rales, wheezing or rhonchi  ABDOMEN: Soft, non-tender, non-distended MUSCULOSKELETAL:  No edema; No deformity  SKIN: Warm and dry NEUROLOGIC:  Alert and oriented x 3 PSYCHIATRIC:  Normal affect   ASSESSMENT:    1. DCM (dilated cardiomyopathy) (HCC)   2. Essential hypertension   3. Renal artery stenosis (HCC)   4. Bilateral carotid artery stenosis   5. Coronary artery disease involving native coronary artery of native heart without angina pectoris    PLAN:    In order of problems listed above:  1.  DCM - EF 40-45% by echo 03/2016.  Felt related to hypertensive DCM and coronary CTA  showed minimal non obstructive CAD.  He will continue on ARB and BB.  2.  HTN - his BP is poorly controlled on exam today.  He will continue on Spironolactone 100mg  daily, Carvedilol 25mg  BID, Irbesartan 300mg  daily and Doxazosin 8mg  daily.  Creatinine stable at 1.21 by [facility] 01/14/2017.  I am going to add Hydralazine 10mg  TID and have him followup in HTN clinic in 1 weeks.   3.  Renal artery stenosis - 1-59% bilateral by dopplers 03/2016.  Since his BP is poorly  controlled I am going to get an abdominal CT with and without contrast to assess for progression of RAS.  4.  Bilateral carotid artery stenosis - 1-39% bilateral carotid stenosis by dopplers 03/2016.  He will continue on ASA.  I will get a copy of lipids from his PCP.  5.  ASCAD -  no ischemia on myoview and coronary CTA with coronary Ca score of 143 and mild nonobstructive plaque in the RCA that is tortuous with aneurysmal dilatation and minimal plaque in the LAD and LCx.  He will continue on BB and ASA.  His LDL is at goal at 68 off statin.    Medication Adjustments/Labs and Tests Ordered: Current medicines are reviewed at length with the patient today.  Concerns regarding medicines are outlined above.  No orders of the defined types were placed in this encounter.  No orders of the defined types were placed in this encounter.   Signed, Armanda Magic, MD  01/23/2017 1:23 PM    [facility]

## 2017-01-23 ENCOUNTER — Encounter: Payer: Self-pay | Admitting: Cardiology

## 2017-01-23 ENCOUNTER — Ambulatory Visit (INDEPENDENT_AMBULATORY_CARE_PROVIDER_SITE_OTHER): Payer: Self-pay | Admitting: Cardiology

## 2017-01-23 VITALS — BP 178/102 | HR 61 | Ht 78.0 in | Wt 213.2 lb

## 2017-01-23 DIAGNOSIS — I10 Essential (primary) hypertension: Secondary | ICD-10-CM

## 2017-01-23 DIAGNOSIS — I701 Atherosclerosis of renal artery: Secondary | ICD-10-CM

## 2017-01-23 DIAGNOSIS — I2510 Atherosclerotic heart disease of native coronary artery without angina pectoris: Secondary | ICD-10-CM

## 2017-01-23 DIAGNOSIS — I420 Dilated cardiomyopathy: Secondary | ICD-10-CM

## 2017-01-23 DIAGNOSIS — I6523 Occlusion and stenosis of bilateral carotid arteries: Secondary | ICD-10-CM

## 2017-01-23 MED ORDER — HYDRALAZINE HCL 10 MG PO TABS: 10.0000 mg | ORAL_TABLET | Freq: Three times a day (TID) | ORAL | 3 refills | Status: DC

## 2017-01-23 MED FILL — hydrALAZINE HCL 10 MG TABS: 10 | 30 days supply | Qty: 90 | Fill #0

## 2017-01-23 NOTE — Patient Instructions (Signed)
Medication Instructions:  Your physician has recommended you make the following change in your medication:   START: hydralazine 10 mg 3 times a day  Labwork: None ordered   Testing/Procedures: Non-Cardiac CT scanning, (CAT scanning), is a noninvasive, special x-ray that produces cross-sectional images of the body using x-rays and a computer. CT scans help physicians diagnose and treat medical conditions. For some CT exams, a contrast material is used to enhance visibility in the area of the body being studied. CT scans provide greater clarity and reveal more details than regular x-ray exams.   Follow-Up: Your physician recommends that you schedule a follow-up appointment in: 3 months with PA  Your physician recommends that you schedule a follow-up appointment in 2 weeks at the hypertension clinic  Your physician wants you to follow-up in: 6 months with Dr. Mayford Knife. You will receive a reminder letter in the mail two months in advance. If you don't receive a letter, please call our office to schedule the follow-up appointment.    Any Other Special Instructions Will Be Listed Below (If Applicable).     If you need a refill on your cardiac medications before your next appointment, please call your pharmacy.

## 2017-01-29 ENCOUNTER — Telehealth: Payer: Self-pay | Admitting: *Deleted

## 2017-01-29 NOTE — Telephone Encounter (Signed)
Spoke with patient and informed him this office will open at 10 am tomorrow due to bad weather. Elyn Peers, NP has 2 openings Thurs morning. He stated he has to call his supervisor first. He stated he will call back tomorrow after 10 am to reschedule. This RN emphasized that he needs to be seen to see how he is doing and refill any medications this office has prescribed. Patient verbalized understanding.

## 2017-01-30 ENCOUNTER — Ambulatory Visit: Payer: Self-pay | Admitting: Nurse Practitioner

## 2017-02-01 ENCOUNTER — Ambulatory Visit (INDEPENDENT_AMBULATORY_CARE_PROVIDER_SITE_OTHER)
Admission: RE | Admit: 2017-02-01 | Discharge: 2017-02-01 | Disposition: A | Discharge status: Home / Self Care | Payer: Self-pay | Source: Ambulatory Visit | Attending: Cardiology | Admitting: Cardiology

## 2017-02-01 ENCOUNTER — Other Ambulatory Visit: Payer: Self-pay | Admitting: Endocrinology

## 2017-02-01 DIAGNOSIS — I701 Atherosclerosis of renal artery: Secondary | ICD-10-CM

## 2017-02-01 MED ORDER — IOPAMIDOL (ISOVUE-370) INJECTION 76%
80.0000 mL | Freq: Once | INTRAVENOUS | Status: AC | PRN
  Administered 2017-02-01: 80 mL via INTRAVENOUS

## 2017-02-01 MED FILL — IRBESARTAN 300 MG TABLET: 300 | 30 days supply | Qty: 30 | Fill #1

## 2017-02-01 MED FILL — DOXAZOSIN MESYLATE 8 MG TAB: 8 | 30 days supply | Qty: 30 | Fill #0

## 2017-02-06 NOTE — Progress Notes (Signed)
Patient ID: Steven Higgins                 DOB: June 15, 1970                      MRN: [id number]     HPI: Steven Higgins is a 47 y.o. male patient of Dr. Mayford Knife who presents today for hypertension follow up.  PMH includes HTN. In 2018, he was admitted to the hospital after a syncopal episode.  He was witnessed to have jerking and shaking movements with his eyes rolling back in his head.  In ER he was noted to be post ictal and potassium was 2.5 and BP 193/179mmHg.  A head CT showed a small hemorrhage in the left parietal lobe secondary to hypertensive microvascular disease.  Carotid dopplers showed mild 1-39% nonobstructive disease and echo showed EF 40-45%. Renal dopplers showed 1-59% renal artery stenosis bilaterally. He was previously seen in HTN clinic and started on valsartan. He has since followed up with Dr. Mayford Knife where his pressure was markedly elevated. He was started on hydralazine 10mg  TID.   He presents today for follow up. He reports that over the last few months he has started having nose bleed every day since med changes. He has always had frequent nose bleeds, but now they are every day. Denies chest pain, SOB, and headaches. He has not missed a dose of his medications in the last 3 weeks. He does get dizzy when going from squatting to standing and when he bends over.   He states that he believes that the hydralazine is working well for his pressures and he is happy about that.    Current HTN meds:  Spironolactone 100mg  daily at bedtime Carvedilol 25mg  BID (before 11am, after 6pm) Irbesartan 300mg  daily in the evening Doxazosin 8mg  daily at bedtime Hydralazine 10mg  TID (am, 5pm, 9pm)  Previously tried: cough with carvedilol and metoprolol. Lisinopril stopped due to cough?  BP goal: <130/80  Family History: The patient's family history includes Diabetes in his mother; Heart attack in his father; Heart disease in his father and mother  Social History: Former smoker, currently uses  smokeless tobacco, occasional alcohol  Diet: Most meals from home, uses seasonings but tries to avoid salts. No coffee and has decreased tea.   Exercise: Waiting to exercise for test results.   Home BP readings: 150-170/75-109 HR 65-70  Wt Readings from Last 3 Encounters:  01/23/17 213 lb 3.2 oz (96.7 kg)  01/17/17 212 lb (96.2 kg)  12/04/16 216 lb 9.6 oz (98.2 kg)   BP Readings from Last 3 Encounters:  02/07/17 (!) 142/94  01/23/17 (!) 178/102  01/17/17 (!) 168/90   Pulse Readings from Last 3 Encounters:  02/07/17 73  01/23/17 61  01/17/17 81    Renal function: CrCl cannot be calculated (Patient's most recent lab result is older than the maximum 21 days allowed.).  Past Medical History:  Diagnosis Date  . CAD (coronary artery disease), native coronary artery 01/22/2017   coronary Ca score of 143 and mild nonobstructive plaque in the RCA that is tortuous with aneurysmal dilatation and minimal plaque in the LAD and LCx by coronary CTA 07/2016  . Carotid artery stenosis 04/04/2016   1-39% bilateral by dopplers 03/2016  . DCM (dilated cardiomyopathy) (HCC)    EF 40-45% by echo 03/2016 presumed due to HTN  . Hyperlipidemia LDL goal <70   . Hypertensive heart disease with CHF (HCC)   . Intracerebral bleed (  HCC)    due to malignant HTN  . Renal artery stenosis (HCC) 04/04/2016   By renal dopplers 03/2016 1-59%  . Seizure disorder (HCC)    as sequela of small intracerebral bleed    Current Outpatient Medications on File Prior to Visit  Medication Sig Dispense Refill  . aspirin EC 81 MG tablet Take 1 tablet (81 mg total) by mouth daily. 90 tablet 3  . carvedilol (COREG) 25 MG tablet Take 1 tablet (25 mg total) by mouth 2 (two) times daily with a meal. 60 tablet 3  . irbesartan (AVAPRO) 300 MG tablet Take 1 tablet (300 mg total) by mouth daily. 30 tablet 2  . levETIRAcetam (KEPPRA) 500 MG tablet Take 1 tablet (500 mg total) by mouth 2 (two) times daily. 180 tablet 2  .  spironolactone (ALDACTONE) 100 MG tablet Take 1 tablet (100 mg total) by mouth daily. 30 tablet 2  . nicotine (NICODERM CQ - DOSED IN MG/24 HOURS) 14 mg/24hr patch Place 1 patch (14 mg total) onto the skin daily. (Patient not taking: Reported on 02/07/2017) 28 patch 0   No current facility-administered medications on file prior to visit.     No Known Allergies  Blood pressure (!) 142/94, pulse 73.   Assessment/Plan: Hypertension: BP is above goal. Have given the patient a schedule for his medication as he is taking several of them at varying times and some are not spaced appropriately. Due to his dizziness will plan to wean doxazosin since this medication is frequently associated with this side effect. Will also increase his hydralazine to 25mg  TID (did advise he use 2 tablets of his 10mg  TID until he completes supply since he just picked up 90 day supply and "money is tight" then he will start 25mg  TID). He ultimately would like to be on one pill and we did discuss that this would be unlikely given that he is on 5 pills to control his pressures. He is willing to continue them all for now.    Thank you, Freddie Apley. Cleatis Polka, PharmD  [facility]  02/07/2017 12:55 PM

## 2017-02-07 ENCOUNTER — Encounter: Payer: Self-pay | Admitting: Pharmacist

## 2017-02-07 ENCOUNTER — Ambulatory Visit (INDEPENDENT_AMBULATORY_CARE_PROVIDER_SITE_OTHER): Payer: Self-pay | Admitting: Pharmacist

## 2017-02-07 VITALS — BP 142/94 | HR 73

## 2017-02-07 DIAGNOSIS — I10 Essential (primary) hypertension: Secondary | ICD-10-CM

## 2017-02-07 MED ORDER — HYDRALAZINE HCL 25 MG PO TABS: 25.0000 mg | ORAL_TABLET | Freq: Three times a day (TID) | ORAL | 3 refills | Status: DC

## 2017-02-07 MED ORDER — DOXAZOSIN MESYLATE 4 MG PO TABS: 4.0000 mg | ORAL_TABLET | Freq: Every day | ORAL | 1 refills | Status: DC

## 2017-02-07 NOTE — Patient Instructions (Addendum)
CONTINUE spironolactone 100mg  daily at bedtime, irbesartan 300mg  daily in evening  START taking carvedilol 25mg  with crackers at 7-8am, and 6-7pm)  DECREASE doxazosin to 4mg  daily (a 1/2 tablet of your current supply then pick up lower strength from pharmacy and take 1 tablet) at bedtime  INCREASE hydralazine to 25mg  three times a day (7-8am, 2-3pm, 8-9pm)

## 2017-02-18 ENCOUNTER — Other Ambulatory Visit: Payer: Self-pay | Admitting: Cardiology

## 2017-02-18 MED FILL — SPIRONOLACTONE 100 MG TABS: 100 | 30 days supply | Qty: 30 | Fill #1

## 2017-02-18 MED FILL — levETIRAcetam 500 MG TABS: 500 | 30 days supply | Qty: 60 | Fill #4

## 2017-02-18 MED FILL — CARVEDILOL 25 MG TABLET: 25 | 30 days supply | Qty: 60 | Fill #1

## 2017-02-19 MED FILL — hydrALAZINE HCL 25 MG TABS: 25 | 90 days supply | Qty: 270 | Fill #0

## 2017-02-19 NOTE — Telephone Encounter (Signed)
Medication Detail    Disp Refills Start End   hydrALAZINE (APRESOLINE) 25 MG tablet 270 tablet 3 02/07/2017 02/07/2018   Sig - Route: Take 1 tablet (25 mg total) by mouth 3 (three) times daily. - Oral   Sent to pharmacy as: hydrALAZINE (APRESOLINE) 25 MG tablet   E-Prescribing Status: Receipt confirmed by pharmacy (02/07/2017 11:34 AM EST)   Pharmacy   [facility] - [place], [place] - [id number]

## 2017-02-26 ENCOUNTER — Encounter: Payer: Self-pay | Admitting: Pharmacist

## 2017-02-26 ENCOUNTER — Other Ambulatory Visit (INDEPENDENT_AMBULATORY_CARE_PROVIDER_SITE_OTHER): Payer: Self-pay

## 2017-02-26 ENCOUNTER — Ambulatory Visit (INDEPENDENT_AMBULATORY_CARE_PROVIDER_SITE_OTHER): Payer: Self-pay | Admitting: Pharmacist

## 2017-02-26 VITALS — BP 148/90 | HR 67

## 2017-02-26 DIAGNOSIS — I10 Essential (primary) hypertension: Secondary | ICD-10-CM

## 2017-02-26 LAB — POTASSIUM: Potassium: 3.8 mEq/L (ref 3.5–5.1)

## 2017-02-26 MED ORDER — HYDRALAZINE HCL 50 MG PO TABS: 50.0000 mg | ORAL_TABLET | Freq: Three times a day (TID) | ORAL | 3 refills | Status: DC

## 2017-02-26 MED FILL — IRBESARTAN 300 MG TABLET: 300 | 30 days supply | Qty: 30 | Fill #2

## 2017-02-26 NOTE — Progress Notes (Signed)
Patient ID: Steven Higgins                 DOB: July 06, 1970                      MRN: [id number]     HPI: Steven Higgins is a 47 y.o. male patient of Dr. Mayford Knife who presents today for hypertension follow up.  PMH includes HTN. In 2018, he was admitted to the hospital after a syncopal episode.  He was witnessed to have jerking and shaking movements with his eyes rolling back in his head.  In ER he was noted to be post ictal and potassium was 2.5 and BP 193/137mmHg.  A head CT showed a small hemorrhage in the left parietal lobe secondary to hypertensive microvascular disease.  Carotid dopplers showed mild 1-39% nonobstructive disease and echo showed EF 40-45%. Renal dopplers showed 1-59% renal artery stenosis bilaterally. He was previously seen in HTN clinic and started on valsartan. At his most recent OV in HTN clinic his hydralazine was increased to 25mg  TID (he completed his 10mg  (2 tablets) TID) and his doxazosin was weaned to 4mg  at bedtime.   He presents today for follow up. He reports that dizziness is much better. He believes that morning pressures (150-160s systolic) are up and night times (140-160s systolic) are down, though the measurements appear to be consistently elevated.   He did smoke 3 cigarettes yesterday. He did smoke this morning and his pressure was elevated right after. He states he tried this to see if his pressure actually was higher after a cigarette. Now he feels more strongly about quitting based on the results.   Current HTN meds:  Spironolactone 100mg  daily at bedtime Carvedilol 25mg  BID (830am, 730pm) Irbesartan 300mg  daily at bedtime Doxazosin 4mg  daily at bedtime Hydralazine 25mg  TID (830am, 5pm, 11pm)  Previously tried: cough with carvedilol and metoprolol. Lisinopril stopped due to cough?  BP goal: <130/80  Family History: The patient's family history includes Diabetes in his mother; Heart attack in his father; Heart disease in his father and mother  Social  History: Current occasional smoker (about 1 pack per week), currently uses smokeless tobacco, occasional alcohol  Diet: Most meals from home, uses seasonings but tries to avoid salts. No coffee and has decreased tea.   Exercise: Waiting to exercise for test results.   Home BP readings: 171/100 highest 135/70 lowest he recalls  Wt Readings from Last 3 Encounters:  01/23/17 213 lb 3.2 oz (96.7 kg)  01/17/17 212 lb (96.2 kg)  12/04/16 216 lb 9.6 oz (98.2 kg)   BP Readings from Last 3 Encounters:  02/26/17 (!) 148/90  02/07/17 (!) 142/94  01/23/17 (!) 178/102   Pulse Readings from Last 3 Encounters:  02/26/17 67  02/07/17 73  01/23/17 61    Renal function: CrCl cannot be calculated (Patient's most recent lab result is older than the maximum 21 days allowed.).  Past Medical History:  Diagnosis Date  . CAD (coronary artery disease), native coronary artery 01/22/2017   coronary Ca score of 143 and mild nonobstructive plaque in the RCA that is tortuous with aneurysmal dilatation and minimal plaque in the LAD and LCx by coronary CTA 07/2016  . Carotid artery stenosis 04/04/2016   1-39% bilateral by dopplers 03/2016  . DCM (dilated cardiomyopathy) (HCC)    EF 40-45% by echo 03/2016 presumed due to HTN  . Hyperlipidemia LDL goal <70   . Hypertensive heart disease with CHF (HCC)   .  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 Renal artery stenosis (HCC) 04/04/2016   By renal dopplers 03/2016 1-59%  . Seizure disorder (HCC)    as sequela of small intracerebral bleed    Current Outpatient Medications on File Prior to Visit  Medication Sig Dispense Refill  . aspirin EC 81 MG tablet Take 1 tablet (81 mg total) by mouth daily. 90 tablet 3  . carvedilol (COREG) 25 MG tablet Take 1 tablet (25 mg total) by mouth 2 (two) times daily with a meal. 60 tablet 3  . doxazosin (CARDURA) 4 MG tablet Take 1 tablet (4 mg total) by mouth daily. 90 tablet 1  . irbesartan (AVAPRO) 300 MG tablet  Take 1 tablet (300 mg total) by mouth daily. 30 tablet 2  . levETIRAcetam (KEPPRA) 500 MG tablet Take 1 tablet (500 mg total) by mouth 2 (two) times daily. 180 tablet 2  . nicotine (NICODERM CQ - DOSED IN MG/24 HOURS) 14 mg/24hr patch Place 1 patch (14 mg total) onto the skin daily. 28 patch 0  . spironolactone (ALDACTONE) 100 MG tablet Take 1 tablet (100 mg total) by mouth daily. 30 tablet 2   No current facility-administered medications on file prior to visit.     No Known Allergies  Blood pressure (!) 148/90, pulse 67.  His cuff: 151/89 HR 63  Assessment/Plan: Hypertension: BP is still above goal. Encouraged smoking cessation again. He is working to quit. Will increase hydralazine to 50mg  TID. Will leave doxazosin dose as prescribed 4mg  at bedtime as dizziness is dramatically improved on lower dose per patient report. Follow up in HTN clinic in 3-4 weeks.    Thank you, Freddie Apley. Cleatis Polka, PharmD  [facility]  02/26/2017 11:54 AM

## 2017-02-26 NOTE — Patient Instructions (Signed)
INCREASE hydralazine to 50mg  three times a day (you may take 2 tablets of your current supply then pick up higher strength from pharmacy and take 1 tablet three times a day)

## 2017-03-01 ENCOUNTER — Ambulatory Visit (INDEPENDENT_AMBULATORY_CARE_PROVIDER_SITE_OTHER): Payer: Self-pay | Admitting: Endocrinology

## 2017-03-01 ENCOUNTER — Encounter: Payer: Self-pay | Admitting: Endocrinology

## 2017-03-01 VITALS — BP 100/62 | HR 74 | Ht 78.0 in | Wt 209.2 lb

## 2017-03-01 DIAGNOSIS — E876 Hypokalemia: Secondary | ICD-10-CM

## 2017-03-01 DIAGNOSIS — I10 Essential (primary) hypertension: Secondary | ICD-10-CM

## 2017-03-01 DIAGNOSIS — M5010 Cervical disc disorder with radiculopathy, unspecified cervical region: Secondary | ICD-10-CM

## 2017-03-01 MED ORDER — CYCLOBENZAPRINE HCL 5 MG PO TABS: 5.0000 mg | ORAL_TABLET | Freq: Every day | ORAL | 1 refills | Status: DC

## 2017-03-01 MED FILL — CYCLOBENZAPRINE 5 MG TABLET: 5 | 15 days supply | Qty: 15 | Fill #0

## 2017-03-01 NOTE — Progress Notes (Signed)
Patient ID: Steven Higgins, male   DOB: 1970/09/25, 47 y.o.   MRN: [id number]              Chief complaint: High blood pressure follow-up  History of Present Illness:  Background information: His hypertension was diagnosed about 10 or 11 years ago and details of his diagnosis and treatment are unknown He had been treated for this for several years but about 2 years ago he decided to stop his medications on his own especially because he was getting cough from lisinopril. He did not have any follow-up for his hypertension even though he thinks his blood pressure was running around 180 He was admitted to the hospital in 03/2016 with than intracerebral hemorrhage and his blood pressure was markedly increased at that time.  He was treated aggressively and blood pressure apparently improved with regimen of amlodipine, labetalol and lisinopril  He was also found to have persistent HYPOKALEMIA in the hospital with potassium levels as low as 2.5   Magnesium level was low normal at 1.7  On his initial evaluation 24-hour urine potassium was ordered and this was significantly high at 57 despite having hypokalemia SALT LOADING test: His urine aldosterone was high at 30.6 and urine sodium was 251 on the second attempt However his CT scan of the adrenal gland did not show any abnormality bilaterally  CURRENT history:   For his hypertension and hypokalemia he has been on spironolactone 100 mg daily without any potassium supplements With this his potassium has been consistently normal  He has had difficulty keeping up with his medications including on his last visit However he has been followed recently by the pharmacist at the heart clinic He also had a renal artery CT arteriogram done by the cardiologist clinic even though he does not have a bruit and this was negative  He is now taking HYDRALAZINE in progressively increasing doses starting with 10 mg and now for the last 2-3 days up to 50 mg 3 times  daily This is in addition to Avapro 300 mg, carvedilol and doxazosin  Blood pressure has been fairly consistently high However more recently with starting hydralazine he has had better control   Today he did bring in his home monitor for comparison and this appears to be fairly accurate  With starting the higher dose of 50 mg hydralazine he says that the last 2 days he has been feeling lightheaded and yesterday was having difficulty standing up and functioning well with systolic blood sugars between [id number] at home sitting down  HYPOKALEMIA appears to be controlled now  BP Readings from Last 3 Encounters:  03/01/17 100/62  02/26/17 (!) 148/90  02/07/17 (!) 142/94    Recent weight history:  Wt Readings from Last 3 Encounters:  03/01/17 209 lb 3.2 oz (94.9 kg)  01/23/17 213 lb 3.2 oz (96.7 kg)  01/17/17 212 lb (96.2 kg)    Lab Results  Component Value Date   CREATININE 1.21 01/14/2017   BUN 11 01/14/2017   NA 137 01/14/2017   K 3.8 02/26/2017   CL 99 01/14/2017   CO2 33 (H) 01/14/2017      Past Medical History:  Diagnosis Date  . CAD (coronary artery disease), native coronary artery 01/22/2017   coronary Ca score of 143 and mild nonobstructive plaque in the RCA that is tortuous with aneurysmal dilatation and minimal plaque in the LAD and LCx by coronary CTA 07/2016  . Carotid artery stenosis 04/04/2016   1-39% bilateral by dopplers  03/2016  . DCM (dilated cardiomyopathy) (HCC)    EF 40-45% by echo 03/2016 presumed due to HTN  . Hyperlipidemia LDL goal <70   . Hypertensive heart disease with CHF (HCC)   . 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 Renal artery stenosis (HCC) 04/04/2016   By renal dopplers 03/2016 1-59%  . Seizure disorder (HCC)    as sequela of small intracerebral bleed    No past surgical history on file.  Family History  Problem Relation Age of Onset  . Diabetes Mother   . Heart disease Mother   . Hypertension Mother   . Heart disease  Father   . Heart attack Father   . Hypertension Father   . Hypertension Maternal Grandmother   . Hypertension Paternal Grandmother     Social History:  reports that he has been smoking cigarettes.  He has been smoking about 0.25 packs per day. he has never used smokeless tobacco. He reports that he does not drink alcohol or use drugs.  Allergies: No Known Allergies  Allergies as of 03/01/2017   No Known Allergies     Medication List        Accurate as of 03/01/17 11:59 PM. Always use your most recent med list.          aspirin EC 81 MG tablet Take 1 tablet (81 mg total) by mouth daily.   carvedilol 25 MG tablet Commonly known as:  COREG Take 1 tablet (25 mg total) by mouth 2 (two) times daily with a meal.   cyclobenzaprine 5 MG tablet Commonly known as:  FLEXERIL Take 1 tablet (5 mg total) by mouth at bedtime.   doxazosin 4 MG tablet Commonly known as:  CARDURA Take 1 tablet (4 mg total) by mouth daily.   hydrALAZINE 50 MG tablet Commonly known as:  APRESOLINE Take 1 tablet (50 mg total) by mouth 3 (three) times daily.   irbesartan 300 MG tablet Commonly known as:  AVAPRO Take 1 tablet (300 mg total) by mouth daily.   levETIRAcetam 500 MG tablet Commonly known as:  KEPPRA Take 1 tablet (500 mg total) by mouth 2 (two) times daily.   nicotine 14 mg/24hr patch Commonly known as:  NICODERM CQ - dosed in mg/24 hours Place 1 patch (14 mg total) onto the skin daily.   spironolactone 100 MG tablet Commonly known as:  ALDACTONE Take 1 tablet (100 mg total) by mouth daily.       LABS:  Lab on 02/26/2017  Component Date Value Ref Range Status  . Potassium 02/26/2017 3.8  3.5 - 5.1 mEq/L Final        Review of Systems  The patient is also complaining about neck pain especially on the left side Also has a little pain down the left arm along with a feeling of numbness in the first 2 fingers He says that this started after his CT scan that was done 3 or 4 weeks  ago He has not had any treatment and has had difficulty sleeping because of this Currently not seeing a PCP  PHYSICAL EXAM:  BP 100/62 (BP Location: Left Arm, Cuff Size: Normal)   Pulse 74   Ht 6\' 6"  (1.981 m)   Wt 209 lb 3.2 oz (94.9 kg)   SpO2 96%   BMI 24.18 kg/m   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 without abnormality on CT of the adrenals  Blood  pressure is progressively improved now, however with the very rapid escalation of his dose of hydralazine from the cardiology clinic he is now getting orthostatic symptoms and relatively low blood pressure readings the last 2 or 3 days  Hypokalemia: Controlled, still continuing 100 mg of Aldactone  Left sided neck pain: Likely to be from cervical disc syndrome, has had some radicular symptoms also    PLAN:   He will stop hydralazine for at least a couple of days When he is noticing blood pressure readings over at least 130 systolic he will start with 25 mg twice daily only He will continue to monitor his blood pressure at home regularly  He will also try Flexeril at bedtime for his cervical disc pain and muscle spasm but currently he does not want a pain medication Encouraged him to establish with a PCP  Discussed with the patient that since he does not have a cardiac problem he should not have duplicate visits with cardiology and over here for hypertension management   Patient Instructions  No hydrallazine for 1-2 days  When BP goes over 130, Then take only 25 mg , 1 pill twice daily    Total visit time for evaluation and management of multiple problems, review of medications, labs and other medical records and studies =25 minutes  Reather Littler 03/03/2017, 4:00 PM

## 2017-03-01 NOTE — Patient Instructions (Addendum)
No hydrallazine for 1-2 days  When BP goes over 130, Then take only 25 mg , 1 pill twice daily

## 2017-03-25 ENCOUNTER — Ambulatory Visit (INDEPENDENT_AMBULATORY_CARE_PROVIDER_SITE_OTHER): Payer: Self-pay | Admitting: Pharmacist

## 2017-03-25 ENCOUNTER — Other Ambulatory Visit: Payer: Self-pay | Admitting: Cardiology

## 2017-03-25 ENCOUNTER — Encounter: Payer: Self-pay | Admitting: Pharmacist

## 2017-03-25 ENCOUNTER — Other Ambulatory Visit: Payer: Self-pay | Admitting: Endocrinology

## 2017-03-25 VITALS — BP 122/62 | HR 136

## 2017-03-25 DIAGNOSIS — I10 Essential (primary) hypertension: Secondary | ICD-10-CM

## 2017-03-25 MED ORDER — CARVEDILOL 25 MG PO TABS: 25.0000 mg | ORAL_TABLET | Freq: Two times a day (BID) | ORAL | 3 refills | Status: DC

## 2017-03-25 MED FILL — CARVEDILOL 25 MG TABLET: 25 | 30 days supply | Qty: 60 | Fill #0

## 2017-03-25 MED FILL — levETIRAcetam 500 MG TABS: 500 | 30 days supply | Qty: 60 | Fill #5

## 2017-03-25 MED FILL — SPIRONOLACTONE 100 MG TAB: 100 | 30 days supply | Qty: 30 | Fill #2

## 2017-03-25 NOTE — Progress Notes (Signed)
Patient ID: Steven Higgins                 DOB: 25-Jan-1970                      MRN: [id number]     HPI: Steven Higgins is a 47 y.o. male patient of Dr. Mayford Knife who presents today for hypertension follow up.  PMH includes HTN. In 2018, he was admitted to the hospital after a syncopal episode.  He was witnessed to have jerking and shaking movements with his eyes rolling back in his head.  In ER he was noted to be post ictal and potassium was 2.5 and BP 193/123mmHg.  A head CT showed a small hemorrhage in the left parietal lobe secondary to hypertensive microvascular disease.  Carotid dopplers showed mild 1-39% nonobstructive disease and echo showed EF 40-45%. Renal dopplers showed 1-59% renal artery stenosis bilaterally. He was previously seen in HTN clinic. At his most recent OV in HTN clinic his hydralazine was increased to 50mg  TID.   He presents today for follow up. He reports that Dr. Lucianne Muss decreased his hydralazine to 25mg  TID - when looking at the note it appears Dr. Lucianne Muss instructed the patient to stop hydralazine. He reports that the last few nights that he has taken 50mg  because his pressures have been high (170s/90s) and in the morning his pressure is low (90s-110s/80s). He has not been taking carvedilol since Friday as he ran out. He reports elevated HR into 100s the last two days. He states he is very concerned over elevated HR, but the blood pressure has been low. Ran out of spironolactone yesterday.   Current HTN meds:  Spironolactone 100mg  daily at bedtime Carvedilol 25mg  BID (830am, 730pm) Irbesartan 300mg  daily at bedtime Doxazosin 4mg  daily at bedtime Hydralazine 50mg  TID (830am, 5pm, 11pm) - has not been taking.   Previously tried: cough with carvedilol and metoprolol? Lisinopril stopped due to cough?  BP goal: <130/80  Family History: The patient's family history includes Diabetes in his mother; Heart attack in his father; Heart disease in his father and mother  Social History:  Current occasional smoker (about 1 pack per week), currently uses smokeless tobacco, occasional alcohol  Diet: Most meals from home, uses seasonings but tries to avoid salts. No coffee and has decreased tea.   Exercise: Waiting to exercise for test results.   Home BP readings: 115/90 HR 122, 98/79 HR 135,   Wt Readings from Last 3 Encounters:  03/01/17 209 lb 3.2 oz (94.9 kg)  01/23/17 213 lb 3.2 oz (96.7 kg)  01/17/17 212 lb (96.2 kg)   BP Readings from Last 3 Encounters:  03/25/17 122/62  03/01/17 100/62  02/26/17 (!) 148/90   Pulse Readings from Last 3 Encounters:  03/25/17 (!) 136  03/01/17 74  02/26/17 67    Renal function: CrCl cannot be calculated (Patient's most recent lab result is older than the maximum 21 days allowed.).  Past Medical History:  Diagnosis Date  . CAD (coronary artery disease), native coronary artery 01/22/2017   coronary Ca score of 143 and mild nonobstructive plaque in the RCA that is tortuous with aneurysmal dilatation and minimal plaque in the LAD and LCx by coronary CTA 07/2016  . Carotid artery stenosis 04/04/2016   1-39% bilateral by dopplers 03/2016  . DCM (dilated cardiomyopathy) (HCC)    EF 40-45% by echo 03/2016 presumed due to HTN  . Hyperlipidemia LDL goal <70   . Hypertensive  heart disease with CHF (HCC)   . 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 Renal artery stenosis (HCC) 04/04/2016   By renal dopplers 03/2016 1-59%  . Seizure disorder (HCC)    as sequela of small intracerebral bleed    Current Outpatient Medications on File Prior to Visit  Medication Sig Dispense Refill  . aspirin EC 81 MG tablet Take 1 tablet (81 mg total) by mouth daily. 90 tablet 3  . doxazosin (CARDURA) 4 MG tablet Take 1 tablet (4 mg total) by mouth daily. 90 tablet 1  . irbesartan (AVAPRO) 300 MG tablet Take 1 tablet (300 mg total) by mouth daily. 30 tablet 2  . nicotine (NICODERM CQ - DOSED IN MG/24 HOURS) 14 mg/24hr patch Place 1 patch (14 mg  total) onto the skin daily. 28 patch 0  . spironolactone (ALDACTONE) 100 MG tablet Take 1 tablet (100 mg total) by mouth daily. 30 tablet 2  . cyclobenzaprine (FLEXERIL) 5 MG tablet Take 1 tablet (5 mg total) by mouth at bedtime. (Patient not taking: Reported on 03/25/2017) 15 tablet 1  . hydrALAZINE (APRESOLINE) 50 MG tablet Take 1 tablet (50 mg total) by mouth 3 (three) times daily. (Patient not taking: Reported on 03/25/2017) 270 tablet 3  . levETIRAcetam (KEPPRA) 500 MG tablet Take 1 tablet (500 mg total) by mouth 2 (two) times daily. (Patient not taking: Reported on 03/25/2017) 180 tablet 2   No current facility-administered medications on file prior to visit.     No Known Allergies  Blood pressure 122/62, pulse (!) 136.    Assessment/Plan: Hypertension: BP is at goal today. HR is very elevated likely due to missed doses of his betablocker. Will restart this today. Advised he remain off hydralazine as Dr. Lucianne Muss recommended. Agree that he should not be managed by 2 clinics for BP and will defer to Dr. Lucianne Muss since he is primary. Did provide him our number should he have any issues or his pressures drop low and he is unable to get in touch with Dr. Lucianne Muss. He will follow up with Dr. Lucianne Muss as scheduled on 04/08/17 and HTN clinic if needed.    Thank you, Freddie Apley. Cleatis Polka, PharmD  [facility]  03/25/2017 1:32 PM

## 2017-03-25 NOTE — Patient Instructions (Addendum)
RESTART carvedilol 25mg  TWICE daily.   CONTINUE All other medications as prescribed by Dr. Lucianne Muss  Follow up with Dr. Lucianne Muss as scheduled on 04/08/17.

## 2017-03-27 ENCOUNTER — Other Ambulatory Visit: Payer: Self-pay | Admitting: Cardiology

## 2017-04-08 ENCOUNTER — Ambulatory Visit (INDEPENDENT_AMBULATORY_CARE_PROVIDER_SITE_OTHER): Payer: Medicaid Other | Admitting: Endocrinology

## 2017-04-08 ENCOUNTER — Encounter: Payer: Self-pay | Admitting: Endocrinology

## 2017-04-08 VITALS — BP 100/75 | HR 76 | Ht 78.0 in | Wt 209.0 lb

## 2017-04-08 DIAGNOSIS — I10 Essential (primary) hypertension: Secondary | ICD-10-CM

## 2017-04-08 NOTE — Progress Notes (Signed)
Patient ID: Steven Higgins, male   DOB: 1970/09/15, 47 y.o.   MRN: [id number]              Chief complaint: High blood pressure follow-up  History of Present Illness:  Background information: His hypertension was diagnosed about 10 or 11 years ago and details of his diagnosis and treatment are unknown He had been treated for this for several years but about 2 years ago he decided to stop his medications on his own especially because he was getting cough from lisinopril. He did not have any follow-up for his hypertension even though he thinks his blood pressure was running around 180 He was admitted to the hospital in 03/2016 with than intracerebral hemorrhage and his blood pressure was markedly increased at that time.  He was treated aggressively and blood pressure apparently improved with regimen of amlodipine, labetalol and lisinopril  He was also found to have persistent HYPOKALEMIA in the hospital with potassium levels as low as 2.5   Magnesium level was low normal at 1.7  On his initial evaluation 24-hour urine potassium was ordered and this was significantly high at 57 despite having hypokalemia SALT LOADING test: His urine aldosterone was high at 30.6 and urine sodium was 251 on the second attempt However his CT scan of the adrenal gland did not show any abnormality bilaterally He also had a renal artery CT arteriogram done by the cardiologist clinic even though he does not have a bruit and this was negative  CURRENT history:  For his hypertension and hypokalemia he has been on spironolactone 100 mg daily without any potassium supplements With this his potassium has been consistently normal  He is here for short-term follow-up since on his last visit in March he was starting to get hypotensive with progressively higher doses of hydralazine prescribed in the heart clinic by the pharmacist  He was told to cut down the hydralazine to only 25 mg twice a day when the blood pressure  would start going up over 130 His blood pressure monitor was also checked for comparison previously  He has not been taking his hydralazine in the morning fairly consistently but his blood pressure has been only occasionally over 130 or 140 Most of the time he is getting readings about 110-120 systolic at home and on a couple of days he did get low blood pressure with readings as low as 97 systolic  For this reason he has been taking mostly 1 tablet of hydralazine the morning No lightheadedness today but his blood pressure is still relatively low and lower standing up His medication list was reviewed and updated and he is taking medications as prescribed  HYPOKALEMIA appears to be controlled now  BP Readings from Last 3 Encounters:  04/08/17 100/75  03/25/17 122/62  03/01/17 100/62    Recent weight history:  Wt Readings from Last 3 Encounters:  04/08/17 209 lb (94.8 kg)  03/01/17 209 lb 3.2 oz (94.9 kg)  01/23/17 213 lb 3.2 oz (96.7 kg)    Lab Results  Component Value Date   CREATININE 1.21 01/14/2017   BUN 11 01/14/2017   NA 137 01/14/2017   K 3.8 02/26/2017   CL 99 01/14/2017   CO2 33 (H) 01/14/2017      Past Medical History:  Diagnosis Date  . CAD (coronary artery disease), native coronary artery 01/22/2017   coronary Ca score of 143 and mild nonobstructive plaque in the RCA that is tortuous with aneurysmal dilatation and minimal plaque  in the LAD and LCx by coronary CTA 07/2016  . Carotid artery stenosis 04/04/2016   1-39% bilateral by dopplers 03/2016  . DCM (dilated cardiomyopathy) (HCC)    EF 40-45% by echo 03/2016 presumed due to HTN  . Hyperlipidemia LDL goal <70   . Hypertensive heart disease with CHF (HCC)   . 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 Renal artery stenosis (HCC) 04/04/2016   By renal dopplers 03/2016 1-59%  . Seizure disorder (HCC)    as sequela of small intracerebral bleed    History reviewed. No pertinent surgical  history.  Family History  Problem Relation Age of Onset  . Diabetes Mother   . Heart disease Mother   . Hypertension Mother   . Heart disease Father   . Heart attack Father   . Hypertension Father   . Hypertension Maternal Grandmother   . Hypertension Paternal Grandmother     Social History:  reports that he has been smoking cigarettes.  He has been smoking about 0.25 packs per day. He has never used smokeless tobacco. He reports that he does not drink alcohol or use drugs.  Allergies: No Known Allergies  Allergies as of 04/08/2017   No Known Allergies     Medication List        Accurate as of 04/08/17  8:28 PM. Always use your most recent med list.          aspirin EC 81 MG tablet Take 1 tablet (81 mg total) by mouth daily.   carvedilol 25 MG tablet Commonly known as:  COREG Take 1 tablet (25 mg total) by mouth 2 (two) times daily with a meal.   doxazosin 4 MG tablet Commonly known as:  CARDURA Take 1 tablet (4 mg total) by mouth daily.   irbesartan 150 MG tablet Commonly known as:  AVAPRO Take 150 mg by mouth daily.   levETIRAcetam 500 MG tablet Commonly known as:  KEPPRA Take 1 tablet (500 mg total) by mouth 2 (two) times daily.   nicotine 14 mg/24hr patch Commonly known as:  NICODERM CQ - dosed in mg/24 hours Place 1 patch (14 mg total) onto the skin daily.   spironolactone 100 MG tablet Commonly known as:  ALDACTONE Take 1 tablet (100 mg total) by mouth daily.       LABS:  No visits with results within 1 Week(s) from this visit.  Latest known visit with results is:  Lab on 02/26/2017  Component Date Value Ref Range Status  . 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 without abnormality on CT of the adrenals  Blood pressure is looking very well controlled recently and he thinks that occasionally he may still get relatively low blood pressure readings with lightheadedness even only 25 mg hydralazine    Hypokalemia: Controlled with 100 mg of Aldactone and will recheck on the next visit     PLAN:   He will stop hydralazine  Most likely his blood pressure can  be adequately controlled with current regimen including doxazosin 4 mg daily without additional hydralazine which he is taking only small doses and only once a day Blood pressure may be easier to control recently because of his not working Will recheck potassium on the next visit Continue to monitor blood pressure at various times at home   Patient Instructions  Stop hydralazine, stay on 4 mg doxazosin   Reather Littler 04/08/2017, 8:28 PM

## 2017-04-08 NOTE — Patient Instructions (Addendum)
Stop hydralazine, stay on 4 mg doxazosin

## 2017-04-09 MED FILL — DOXAZOSIN MESYLATE 4 MG TAB: 4 | 30 days supply | Qty: 30 | Fill #0

## 2017-04-23 MED FILL — SPIRONOLACTONE 100 MG TABS: 100 | 30 days supply | Qty: 30 | Fill #0

## 2017-05-09 MED FILL — levETIRAcetam 500 MG TABS: 500 | 30 days supply | Qty: 60 | Fill #6

## 2017-05-09 MED FILL — DOXAZOSIN MESYLATE 4 MG TAB: 4 | 30 days supply | Qty: 30 | Fill #1

## 2017-05-09 MED FILL — CARVEDILOL 25 MG TABLET: 25 | 30 days supply | Qty: 60 | Fill #1

## 2017-05-24 MED FILL — IRBESARTAN 150 MG TABLET: 150 | 90 days supply | Qty: 90 | Fill #2

## 2017-05-24 MED FILL — SPIRONOLACTONE 100 MG TABS: 100 | 30 days supply | Qty: 30 | Fill #1

## 2017-06-04 ENCOUNTER — Other Ambulatory Visit: Payer: Medicaid Other

## 2017-06-07 ENCOUNTER — Encounter: Payer: Self-pay | Admitting: Endocrinology

## 2017-06-07 ENCOUNTER — Ambulatory Visit (INDEPENDENT_AMBULATORY_CARE_PROVIDER_SITE_OTHER): Payer: Self-pay | Admitting: Endocrinology

## 2017-06-07 VITALS — BP 128/86 | HR 63 | Ht 78.0 in | Wt 211.4 lb

## 2017-06-07 DIAGNOSIS — E876 Hypokalemia: Secondary | ICD-10-CM

## 2017-06-07 DIAGNOSIS — I10 Essential (primary) hypertension: Secondary | ICD-10-CM

## 2017-06-07 LAB — BASIC METABOLIC PANEL
BUN: 16 mg/dL (ref 6–23)
CHLORIDE: 98 meq/L (ref 96–112)
CO2: 27 mEq/L (ref 19–32)
Calcium: 10.2 mg/dL (ref 8.4–10.5)
Creatinine, Ser: 1.35 mg/dL (ref 0.40–1.50)
GFR: 73.48 mL/min (ref >60.00)
Glucose, Bld: 88 mg/dL (ref 70–99)
POTASSIUM: 4 meq/L (ref 3.5–5.1)
SODIUM: 135 meq/L (ref 135–145)

## 2017-06-07 NOTE — Progress Notes (Signed)
Patient ID: Steven Higgins, male   DOB: 01-Aug-1970, 47 y.o.   MRN: [id number]              Chief complaint: High blood pressure follow-up  History of Present Illness:  Background information: His hypertension was diagnosed about 10 or 11 years ago and details of his diagnosis and treatment are unknown He had been treated for this for several years but about 2 years ago he decided to stop his medications on his own especially because he was getting cough from lisinopril. He did not have any follow-up for his hypertension even though he thinks his blood pressure was running around 180 He was admitted to the hospital in 03/2016 with than intracerebral hemorrhage and his blood pressure was markedly increased at that time.  He was treated aggressively and blood pressure apparently improved with regimen of amlodipine, labetalol and lisinopril  He was also found to have persistent HYPOKALEMIA in the hospital with potassium levels as low as 2.5   Magnesium level was low normal at 1.7  On his initial evaluation 24-hour urine potassium was ordered and this was significantly high at 57 despite having hypokalemia SALT LOADING test: His urine aldosterone was high at 30.6 and urine sodium was 251 on the second attempt However his CT scan of the adrenal gland did not show any abnormality bilaterally He also had a renal artery CT arteriogram done by the cardiologist clinic even though he does not have a bruit and this was negative  CURRENT history:  For his hypertension and hypokalemia he has been on spironolactone 100 mg daily without any potassium supplements With this his potassium has been consistently normal  He was told to stop the hydralazine on his visit in April because of low normal blood pressure readings and orthostatic dizziness at times His blood pressure monitor was also checked for comparison in the office and was found to be relatively accurate  He has been compliant with all his  medications as prescribed including irbesartan, doxazosin and carvedilol  However he still tends to get lightheaded when he bends down and gets up but usually not on a routine basis   Home readings: 120-154/70-81   BP Readings from Last 3 Encounters:  06/07/17 136/82  04/08/17 100/75  03/25/17 122/62    Recent weight history:  Wt Readings from Last 3 Encounters:  06/07/17 211 lb 6.4 oz (95.9 kg)  04/08/17 209 lb (94.8 kg)  03/01/17 209 lb 3.2 oz (94.9 kg)    Lab Results  Component Value Date   CREATININE 1.21 01/14/2017   BUN 11 01/14/2017   NA 137 01/14/2017   K 3.8 02/26/2017   CL 99 01/14/2017   CO2 33 (H) 01/14/2017      Past Medical History:  Diagnosis Date  . CAD (coronary artery disease), native coronary artery 01/22/2017   coronary Ca score of 143 and mild nonobstructive plaque in the RCA that is tortuous with aneurysmal dilatation and minimal plaque in the LAD and LCx by coronary CTA 07/2016  . Carotid artery stenosis 04/04/2016   1-39% bilateral by dopplers 03/2016  . DCM (dilated cardiomyopathy) (HCC)    EF 40-45% by echo 03/2016 presumed due to HTN  . Hyperlipidemia LDL goal <70   . Hypertensive heart disease with CHF (HCC)   . 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 Renal artery stenosis (HCC) 04/04/2016   By renal dopplers 03/2016 1-59%  . Seizure disorder (HCC)  as sequela of small intracerebral bleed    History reviewed. No pertinent surgical history.  Family History  Problem Relation Age of Onset  . Diabetes Mother   . Heart disease Mother   . Hypertension Mother   . Heart disease Father   . Heart attack Father   . Hypertension Father   . Hypertension Maternal Grandmother   . Hypertension Paternal Grandmother     Social History:  reports that he has been smoking cigarettes.  He has been smoking about 0.25 packs per day. He has never used smokeless tobacco. He reports that he does not drink alcohol or use drugs.  Allergies:  No Known Allergies  Allergies as of 06/07/2017   No Known Allergies     Medication List        Accurate as of 06/07/17  1:40 PM. Always use your most recent med list.          aspirin EC 81 MG tablet Take 1 tablet (81 mg total) by mouth daily.   carvedilol 25 MG tablet Commonly known as:  COREG Take 1 tablet (25 mg total) by mouth 2 (two) times daily with a meal.   doxazosin 4 MG tablet Commonly known as:  CARDURA Take 1 tablet (4 mg total) by mouth daily.   irbesartan 150 MG tablet Commonly known as:  AVAPRO Take 150 mg by mouth daily.   levETIRAcetam 500 MG tablet Commonly known as:  KEPPRA Take 1 tablet (500 mg total) by mouth 2 (two) times daily.   nicotine 14 mg/24hr patch Commonly known as:  NICODERM CQ - dosed in mg/24 hours Place 1 patch (14 mg total) onto the skin daily.   spironolactone 100 MG tablet Commonly known as:  ALDACTONE Take 1 tablet (100 mg total) by mouth daily.       LABS:  No visits with results within 1 Week(s) from this visit.  Latest known visit with results is:  Lab on 02/26/2017  Component Date Value Ref Range Status  . 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 without abnormality on CT of the adrenals  Blood pressure is appearing well controlled although higher when checked by myself today He is taking 4 medications including Aldactone Although he is not orthostatic today he reports orthostatic dizziness especially when bending down or getting up quickly   Hypokalemia: Controlled with 100 mg of Aldactone and will recheck today   PLAN:  He can try  taking 1/2 tablet of doxazosin to reduce the orthostatic symptoms However if diastolic readings stay consistently high he will need to call us and we can increase his irbesartan to 300 mg Follow-up in 4 months and continue to monitor blood pressure at home  There are no Patient Instructions on file for this visit.  Reather Littler 06/07/2017, 1:40 PM

## 2017-06-07 NOTE — Patient Instructions (Signed)
Take 1/2 Doxazosin at [employer]

## 2017-06-11 ENCOUNTER — Other Ambulatory Visit: Payer: Self-pay | Admitting: Endocrinology

## 2017-06-11 ENCOUNTER — Other Ambulatory Visit: Payer: Self-pay

## 2017-06-11 ENCOUNTER — Telehealth: Payer: Self-pay | Admitting: Endocrinology

## 2017-06-11 MED ORDER — VARENICLINE TARTRATE 0.5 MG PO TABS: 0.5000 mg | ORAL_TABLET | Freq: Two times a day (BID) | ORAL | 0 refills | Status: DC

## 2017-06-11 NOTE — Telephone Encounter (Signed)
Patient asked if Dr Lucianne Muss could send in Chantix to his pharmacy Please advise      [facility] - [place], [place] - [address] - [place].

## 2017-06-11 NOTE — Telephone Encounter (Signed)
Prescription has been sent for 30 days but he needs to establish with a PCP for further prescriptions.  Please have him call [place] [facility]

## 2017-06-11 NOTE — Telephone Encounter (Signed)
Okay to refill? 

## 2017-06-12 NOTE — Telephone Encounter (Signed)
Pt called and left detailed voicemail with MD message. 

## 2017-06-17 MED FILL — DOXAZOSIN MESYLATE 4 MG TAB: 4 | 30 days supply | Qty: 30 | Fill #2

## 2017-06-25 MED FILL — SPIRONOLACTONE 100 MG TABS: 100 | 30 days supply | Qty: 30 | Fill #2

## 2017-07-10 MED FILL — levETIRAcetam 500 MG TABS: 500 | 30 days supply | Qty: 60 | Fill #7

## 2017-07-10 MED FILL — CARVEDILOL 25 MG TABLET: 25 | 30 days supply | Qty: 60 | Fill #2

## 2017-07-26 ENCOUNTER — Other Ambulatory Visit: Payer: Self-pay | Admitting: Endocrinology

## 2017-07-26 MED FILL — SPIRONOLACTONE 100 MG TABS: 100 | 30 days supply | Qty: 30 | Fill #0

## 2017-07-26 MED FILL — DOXAZOSIN MESYLATE 4 MG TAB: 4 | 30 days supply | Qty: 30 | Fill #3

## 2017-09-13 ENCOUNTER — Other Ambulatory Visit: Payer: Self-pay | Admitting: Nurse Practitioner

## 2017-09-13 MED FILL — CARVEDILOL 25 MG TABLET: 25 | 30 days supply | Qty: 60 | Fill #3

## 2017-09-13 MED FILL — DOXAZOSIN MESYLATE 4 MG TAB: 4 | 30 days supply | Qty: 30 | Fill #4

## 2017-10-04 ENCOUNTER — Other Ambulatory Visit (INDEPENDENT_AMBULATORY_CARE_PROVIDER_SITE_OTHER): Payer: Self-pay

## 2017-10-04 DIAGNOSIS — I10 Essential (primary) hypertension: Secondary | ICD-10-CM

## 2017-10-04 LAB — BASIC METABOLIC PANEL
BUN: 14 mg/dL (ref 6–23)
CHLORIDE: 102 meq/L (ref 96–112)
CO2: 26 mEq/L (ref 19–32)
Calcium: 9.2 mg/dL (ref 8.4–10.5)
Creatinine, Ser: 1.38 mg/dL (ref 0.40–1.50)
GFR: 71.53 mL/min (ref >60.00)
GLUCOSE: 171 mg/dL — AB (ref 70–99)
POTASSIUM: 3.7 meq/L (ref 3.5–5.1)
SODIUM: 137 meq/L (ref 135–145)

## 2017-10-06 NOTE — Progress Notes (Addendum)
Patient ID: Steven Higgins, male   DOB: 1970-10-31, 47 y.o.   MRN: [id number]              Chief complaint: High blood pressure follow-up  History of Present Illness:  Background information: His hypertension was diagnosed about 10 or 11 years ago and details of his diagnosis and treatment are unknown He had been treated for this for several years but about 2 years ago he decided to stop his medications on his own especially because he was getting cough from lisinopril. He did not have any follow-up for his hypertension even though he thinks his blood pressure was running around 180 He was admitted to the hospital in 03/2016 with than intracerebral hemorrhage and his blood pressure was markedly increased at that time.  He was treated aggressively and blood pressure apparently improved with regimen of amlodipine, labetalol and lisinopril  He was also found to have persistent HYPOKALEMIA in the hospital with potassium levels as low as 2.5   Magnesium level was low normal at 1.7  On his initial evaluation 24-hour urine potassium was ordered and this was significantly high at 57 despite having hypokalemia SALT LOADING test: His urine aldosterone was high at 30.6 and urine sodium was 251 on the second attempt However his CT scan of the adrenal gland did not show any abnormality bilaterally He also had a renal artery CT arteriogram done by the cardiologist clinic even though he does not have a bruit and this was negative  CURRENT history:  For his hypertension and hypokalemia he has been on spironolactone 100 mg daily without any potassium supplements However he has not refilled his prescription for 10 days now because of cost  With this his potassium has been consistently normal and is still normal although lower at 3.7  He was told to take only half tablet of doxazosin on his last visit because of low normal blood pressure and occasional dizziness but he is taking this half tablet twice a day  on his own Also on his own he has cut his irbesartan in half and does not know why He is taking his carvedilol regularly Only occasionally will get lightheaded  His blood pressure monitor was previously checked for comparison in the office and was found to be relatively accurate His blood pressure appears to fluctuate some although usually not high, slightly higher in the office today check a couple of times  Home readings: 111-154/ 71-84   BP Readings from Last 3 Encounters:  10/07/17 140/80  06/07/17 128/86  04/08/17 100/75    Recent weight history:  Wt Readings from Last 3 Encounters:  10/07/17 205 lb 12.8 oz (93.4 kg)  06/07/17 211 lb 6.4 oz (95.9 kg)  04/08/17 209 lb (94.8 kg)    Lab Results  Component Value Date   CREATININE 1.38 10/04/2017   BUN 14 10/04/2017   NA 137 10/04/2017   K 3.7 10/04/2017   CL 102 10/04/2017   CO2 26 10/04/2017      Past Medical History:  Diagnosis Date  . CAD (coronary artery disease), native coronary artery 01/22/2017   coronary Ca score of 143 and mild nonobstructive plaque in the RCA that is tortuous with aneurysmal dilatation and minimal plaque in the LAD and LCx by coronary CTA 07/2016  . Carotid artery stenosis 04/04/2016   1-39% bilateral by dopplers 03/2016  . DCM (dilated cardiomyopathy) (HCC)    EF 40-45% by echo 03/2016 presumed due to HTN  . Hyperlipidemia LDL  goal <70   . Hypertensive heart disease with CHF (HCC)   . 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 Renal artery stenosis (HCC) 04/04/2016   By renal dopplers 03/2016 1-59%  . Seizure disorder (HCC)    as sequela of small intracerebral bleed    No past surgical history on file.  Family History  Problem Relation Age of Onset  . Diabetes Mother   . Heart disease Mother   . Hypertension Mother   . Heart disease Father   . Heart attack Father   . Hypertension Father   . Hypertension Maternal Grandmother   . Hypertension Paternal Grandmother      Social History:  reports that he has been smoking cigarettes. He has been smoking about 0.25 packs per day. He has never used smokeless tobacco. He reports that he does not drink alcohol or use drugs.  Allergies: No Known Allergies  Allergies as of 10/07/2017   No Known Allergies     Medication List        Accurate as of 10/07/17 11:05 AM. Always use your most recent med list.          aspirin EC 81 MG tablet Take 1 tablet (81 mg total) by mouth daily.   carvedilol 25 MG tablet Commonly known as:  COREG Take 1 tablet (25 mg total) by mouth 2 (two) times daily with a meal.   doxazosin 4 MG tablet Commonly known as:  CARDURA Take 1 tablet (4 mg total) by mouth daily.   irbesartan 150 MG tablet Commonly known as:  AVAPRO Take 150 mg by mouth daily.   levETIRAcetam 500 MG tablet Commonly known as:  KEPPRA TAKE 1 TABLET BY MOUTH TWICE DAILY   nicotine 14 mg/24hr patch Commonly known as:  NICODERM CQ - dosed in mg/24 hours Place 1 patch (14 mg total) onto the skin daily.   spironolactone 100 MG tablet Commonly known as:  ALDACTONE TAKE 1 TABLET (100 MG TOTAL) BY MOUTH DAILY.   varenicline 0.5 MG tablet Commonly known as:  CHANTIX Take 1 tablet (0.5 mg total) by mouth 2 (two) times daily. 1 tablet once a day with dinner for the first week and then twice a day       LABS:  Lab on 10/04/2017  Component Date Value Ref Range Status  . Sodium 10/04/2017 137  135 - 145 mEq/L Final  . Potassium 10/04/2017 3.7  3.5 - 5.1 mEq/L Final  . Chloride 10/04/2017 102  96 - 112 mEq/L Final  . CO2 10/04/2017 26  19 - 32 mEq/L Final  . Glucose, Bld 10/04/2017 171* 70 - 99 mg/dL Final  . BUN 16/10/9602 14  6 - 23 mg/dL Final  . Creatinine, Ser 10/04/2017 1.38  0.40 - 1.50 mg/dL Final  . Calcium 54/09/8117 9.2  8.4 - 10.5 mg/dL Final  . 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 without abnormality on CT of the adrenals  Blood pressure is overall well controlled although higher in the office than at home  He is taking 4 medications including Aldactone but he has been out of the spironolactone recently  Although he is not orthostatic today he reports orthostatic dizziness especially when bending down or getting up quickly   Hypokalemia: Controlled with 100 mg of Aldactone, currently potassium 3.7   PLAN:  He can try taking 1/2 tablet of doxazosin only in the evening to reduce tendency to orthostatic symptoms With no changes of medication Advised him to stay on Spironolactone consistently and he will check with the pharmacy about the cost  Follow-up in 4 months He will call if blood pressure is not consistently controlled  Check A1c on the next visit because of relatively high nonfasting glucose  Given names of PCPs he can call to schedule appointment with for evaluation of decreased appetite and weight loss  There are no Patient Instructions on file for this visit.  Reather Littler 10/07/2017, 11:05 AM

## 2017-10-07 ENCOUNTER — Encounter: Payer: Self-pay | Admitting: Endocrinology

## 2017-10-07 ENCOUNTER — Ambulatory Visit (INDEPENDENT_AMBULATORY_CARE_PROVIDER_SITE_OTHER): Payer: Medicaid Other | Admitting: Endocrinology

## 2017-10-07 VITALS — BP 140/80 | HR 60 | Temp 97.8°F | Ht 78.0 in | Wt 205.8 lb

## 2017-10-07 DIAGNOSIS — E876 Hypokalemia: Secondary | ICD-10-CM

## 2017-10-07 DIAGNOSIS — I10 Essential (primary) hypertension: Secondary | ICD-10-CM

## 2017-10-07 DIAGNOSIS — R739 Hyperglycemia, unspecified: Secondary | ICD-10-CM

## 2017-10-07 MED FILL — SPIRONOLACTONE 100 MG TABS: 100 | 30 days supply | Qty: 30 | Fill #1

## 2017-10-07 NOTE — Addendum Note (Signed)
Addended by: Reather Littler on: 10/07/2017 03:32 PM   Modules accepted: Orders

## 2017-10-07 NOTE — Patient Instructions (Signed)
When on spironolactone no Doxazosin in ams

## 2017-12-13 MED FILL — SPIRONOLACTONE 100 MG TAB: 100 | 30 days supply | Qty: 30 | Fill #2

## 2017-12-13 MED FILL — DOXAZOSIN MESYLATE 4 MG TAB: 4 | 30 days supply | Qty: 30 | Fill #5

## 2017-12-13 MED FILL — levETIRAcetam 500 MG TABS: 500 | 60 days supply | Qty: 120 | Fill #0

## 2018-01-06 ENCOUNTER — Other Ambulatory Visit (INDEPENDENT_AMBULATORY_CARE_PROVIDER_SITE_OTHER): Payer: Self-pay

## 2018-01-06 DIAGNOSIS — R739 Hyperglycemia, unspecified: Secondary | ICD-10-CM

## 2018-01-06 DIAGNOSIS — I10 Essential (primary) hypertension: Secondary | ICD-10-CM

## 2018-01-06 LAB — BASIC METABOLIC PANEL
BUN: 16 mg/dL (ref 6–23)
CHLORIDE: 102 meq/L (ref 96–112)
CO2: 27 meq/L (ref 19–32)
Calcium: 9.9 mg/dL (ref 8.4–10.5)
Creatinine, Ser: 1.32 mg/dL (ref 0.40–1.50)
GFR: 75.21 mL/min (ref >60.00)
GLUCOSE: 143 mg/dL — AB (ref 70–99)
Potassium: 3.9 mEq/L (ref 3.5–5.1)
Sodium: 137 mEq/L (ref 135–145)

## 2018-01-06 LAB — HEMOGLOBIN A1C: Hgb A1c MFr Bld: 6.7 % — ABNORMAL HIGH (ref 4.6–6.5)

## 2018-01-13 ENCOUNTER — Ambulatory Visit (INDEPENDENT_AMBULATORY_CARE_PROVIDER_SITE_OTHER): Payer: Self-pay | Admitting: Endocrinology

## 2018-01-13 ENCOUNTER — Encounter: Payer: Self-pay | Admitting: Endocrinology

## 2018-01-13 VITALS — BP 138/84 | HR 61 | Ht 78.0 in | Wt 210.0 lb

## 2018-01-13 DIAGNOSIS — I10 Essential (primary) hypertension: Secondary | ICD-10-CM

## 2018-01-13 DIAGNOSIS — E1165 Type 2 diabetes mellitus with hyperglycemia: Secondary | ICD-10-CM

## 2018-01-13 MED ORDER — METFORMIN HCL ER 500 MG PO TB24: 500.0000 mg | ORAL_TABLET | Freq: Two times a day (BID) | ORAL | 3 refills | Status: DC

## 2018-01-13 MED ORDER — DOXAZOSIN MESYLATE 4 MG PO TABS: 4.0000 mg | ORAL_TABLET | Freq: Every day | ORAL | 1 refills | Status: DC

## 2018-01-13 MED FILL — metFORMIN HCL ER 500 MG TB2: 500 | 30 days supply | Qty: 60 | Fill #0

## 2018-01-13 MED FILL — DOXAZOSIN MESYLATE 4 MG TAB: 4 | 90 days supply | Qty: 90 | Fill #0

## 2018-01-13 NOTE — Progress Notes (Signed)
Patient ID: Steven Higgins, male   DOB: [birth year], 48 y.o.   MRN: [id number]              Chief complaint: High blood pressure follow-up  History of Present Illness:  Background information: His hypertension was diagnosed about 10 or 11 years ago and details of his diagnosis and treatment are unknown He had been treated for this for several years but about 2 years ago he decided to stop his medications on his own especially because he was getting cough from lisinopril. He did not have any follow-up for his hypertension even though he thinks his blood pressure was running around 180 He was admitted to the hospital in 03/2016 with than intracerebral hemorrhage and his blood pressure was markedly increased at that time.  He was treated aggressively and blood pressure apparently improved with regimen of amlodipine, labetalol and lisinopril  He was also found to have persistent HYPOKALEMIA in the hospital with potassium levels as low as 2.5   Magnesium level was low normal at 1.7  On his initial evaluation 24-hour urine potassium was ordered and this was significantly high at 57 despite having hypokalemia SALT LOADING test: His urine aldosterone was high at 30.6 and urine sodium was 251 on the second attempt However his CT scan of the adrenal gland did not show any abnormality bilaterally He also had a renal artery CT arteriogram done by the cardiologist clinic even though he does not have a bruit and this was negative  CURRENT history:  For his hypertension and hypokalemia he has been on spironolactone 100 mg daily without any potassium supplements He may have adrenal hyperplasia causing hyperaldosteronism  Potassium has been consistently normal at 3.9  He was told to take only half tablet of doxazosin on his last visit because of low normal blood pressure at times but he still taking a full tablet and he does not think he feels dizzy with this Also his systolic readings at home are  relatively lower his diastolic readings are near normal Taking all his medications as prescribed now He is taking his carvedilol regularly but may not take his first tablet the lunchtime has not taken it today  His blood pressure monitor was previously checked for comparison in the office and was found to be relatively accurate His blood pressure appears to be higher in the office frequently  Home readings: 111-124/ 78-82   BP Readings from Last 3 Encounters:  01/13/18 138/84  10/07/17 140/80  06/07/17 128/86    Recent weight history:  Wt Readings from Last 3 Encounters:  01/13/18 210 lb (95.3 kg)  10/07/17 205 lb 12.8 oz (93.4 kg)  06/07/17 211 lb 6.4 oz (95.9 kg)    Lab Results  Component Value Date   CREATININE 1.32 01/06/2018   BUN 16 01/06/2018   NA 137 01/06/2018   K 3.9 01/06/2018   CL 102 01/06/2018   CO2 27 01/06/2018      Past Medical History:  Diagnosis Date  . CAD (coronary artery disease), native coronary artery 01/22/2017   coronary Ca score of 143 and mild nonobstructive plaque in the RCA that is tortuous with aneurysmal dilatation and minimal plaque in the LAD and LCx by coronary CTA 07/2016  . Carotid artery stenosis 04/04/2016   1-39% bilateral by dopplers 03/2016  . DCM (dilated cardiomyopathy) (HCC)    EF 40-45% by echo 03/2016 presumed due to HTN  . Hyperlipidemia LDL goal <70   . Hypertensive heart disease with  CHF (HCC)   . 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 Renal artery stenosis (HCC) 04/04/2016   By renal dopplers 03/2016 1-59%  . Seizure disorder (HCC)    as sequela of small intracerebral bleed    History reviewed. No pertinent surgical history.  Family History  Problem Relation Age of Onset  . Diabetes Mother   . Heart disease Mother   . Hypertension Mother   . Heart disease Father   . Heart attack Father   . Hypertension Father   . Hypertension Maternal Grandmother   . Diabetes Maternal Grandmother   .  Hypertension Paternal Grandmother     Social History:  reports that he has been smoking cigarettes. He has been smoking about 0.25 packs per day. He has never used smokeless tobacco. He reports that he does not drink alcohol or use drugs.  Allergies: No Known Allergies  Allergies as of 01/13/2018   No Known Allergies     Medication List       Accurate as of January 13, 2018  8:24 AM. Always use your most recent med list.        aspirin EC 81 MG tablet Take 1 tablet (81 mg total) by mouth daily.   carvedilol 25 MG tablet Commonly known as:  COREG Take 1 tablet (25 mg total) by mouth 2 (two) times daily with a meal.   doxazosin 4 MG tablet Commonly known as:  CARDURA Take 1 tablet (4 mg total) by mouth daily.   irbesartan 150 MG tablet Commonly known as:  AVAPRO Take 150 mg by mouth daily.   levETIRAcetam 500 MG tablet Commonly known as:  KEPPRA TAKE 1 TABLET BY MOUTH TWICE DAILY   nicotine 14 mg/24hr patch Commonly known as:  NICODERM CQ - dosed in mg/24 hours Place 1 patch (14 mg total) onto the skin daily.   spironolactone 100 MG tablet Commonly known as:  ALDACTONE TAKE 1 TABLET (100 MG TOTAL) BY MOUTH DAILY.       LABS:  No visits with results within 1 Week(s) from this visit.  Latest known visit with results is:  Lab on 01/06/2018  Component Date Value Ref Range Status  . Hgb A1c MFr Bld 01/06/2018 6.7* 4.6 - 6.5 % Final   Glycemic Control Guidelines for People with Diabetes:Non Diabetic:  <6%Goal of Therapy: <7%Additional Action Suggested:  >8%   . Sodium 01/06/2018 137  135 - 145 mEq/L Final  . Potassium 01/06/2018 3.9  3.5 - 5.1 mEq/L Final  . Chloride 01/06/2018 102  96 - 112 mEq/L Final  . CO2 01/06/2018 27  19 - 32 mEq/L Final  . Glucose, Bld 01/06/2018 143* 70 - 99 mg/dL Final  . BUN 84/69/6295 16  6 - 23 mg/dL Final  . Creatinine, Ser 01/06/2018 1.32  0.40 - 1.50 mg/dL Final  . Calcium 28/41/3244 9.9  8.4 - 10.5 mg/dL Final  . GFR 01/03/7251  75.21  >60.00 mL/min Final        Review of Systems    No not seeing a PCP  Occasionally having problems with neck pain  GLUCOSE levels: He has had blood sugars as high as 171 Now however his fasting glucose is 143 and A1c in the diabetic range also He does have family history of diabetes He does not follow a specific diet but usually avoiding sugar drinks Currently not exercising, he says that when he is exercising he gets migraine headaches and does not feel  good  Lab Results  Component Value Date   HGBA1C 6.7 (H) 01/06/2018   HGBA1C 6.1 (H) 03/16/2016   Lab Results  Component Value Date   LDLCALC 68 09/27/2016   CREATININE 1.32 01/06/2018      PHYSICAL EXAM:  BP 138/84 (BP Location: Left Arm, Patient Position: Sitting, Cuff Size: Normal)   Pulse 61   Ht 6\' 6"  (1.981 m)   Wt 210 lb (95.3 kg)   SpO2 98%   BMI 24.27 kg/m    Repeat blood pressure standing was 118/82  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 without abnormality on CT of the adrenals  Blood pressure is overall well controlled although higher in the office than at home  He is taking 4 medications including Aldactone   Although he is not orthostatic today he reports orthostatic dizziness especially when bending down or getting up quickly   Hypokalemia: Controlled with 100 mg of Aldactone, currently potassium 3.7   PLAN:  No change in blood pressure medication  Trial of metformin ER starting with 500 and then increasing to 1000 mg in divided doses Discussed benefits, actions and possible side effects Encouraged him to start a gradual walking program and a sample walking program given to gradually increase activity level He probably needs to see the cardiologist back to see why he has difficulty exercising  Recheck A1c in 3 months  May consider home glucose monitoring if blood sugars are trending higher  Continue  monitoring blood pressure at home He will call if blood pressure is not consistently controlled   There are no Patient Instructions on file for this visit.  Reather Littler 01/13/2018, 8:24 AM

## 2018-01-13 NOTE — Addendum Note (Signed)
Addended by: Reather Littler on: 01/13/2018 09:38 AM   Modules accepted: Orders

## 2018-01-13 NOTE — Patient Instructions (Addendum)
Start taking Metformin 500 mg, 1 tablet with your main meal for 5 days. Occasionally this may initially cause loose stools or nausea. If  tolerating well after 5 days add a second Metformin tablet (500 mg) at the same time.   Continue adding another tablet after 5 days days if no persistent nausea or diarrhea until reaching the maximum tolerated dose or the full dose of 2 tablets once daily.  Exercise

## 2018-02-13 ENCOUNTER — Other Ambulatory Visit: Payer: Self-pay | Admitting: Endocrinology

## 2018-02-13 MED FILL — SPIRONOLACTONE 100 MG TAB: 100 | 30 days supply | Qty: 30 | Fill #0 | Status: TO

## 2018-03-23 IMAGING — DX DG LUMBAR SPINE COMPLETE 4+V
5 series · 5 of 5 positions shown · non-contrast
Comparison: None.

CLINICAL DATA: 45-year-old male with lumbar spine pain following a
seizure earlier today

EXAM:
LUMBAR SPINE - COMPLETE 4+ VIEW

[l-spine ap]
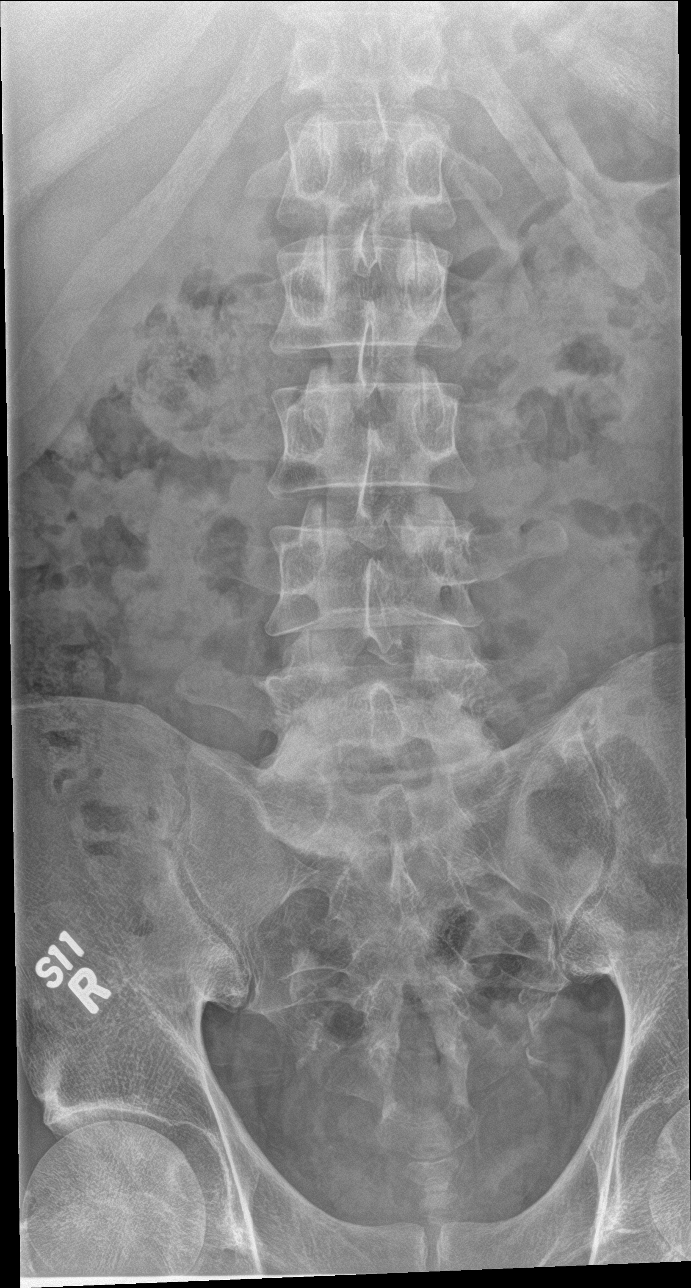

[l-spine obl (1 of 2)]
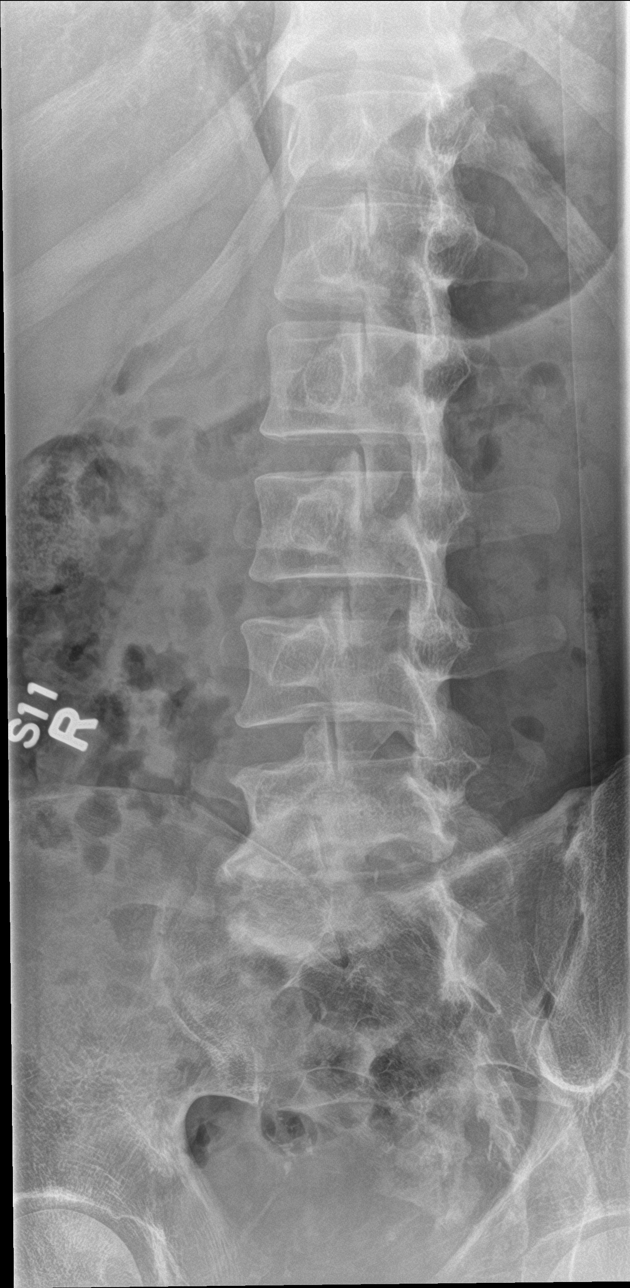

[l-spine obl (2 of 2)]
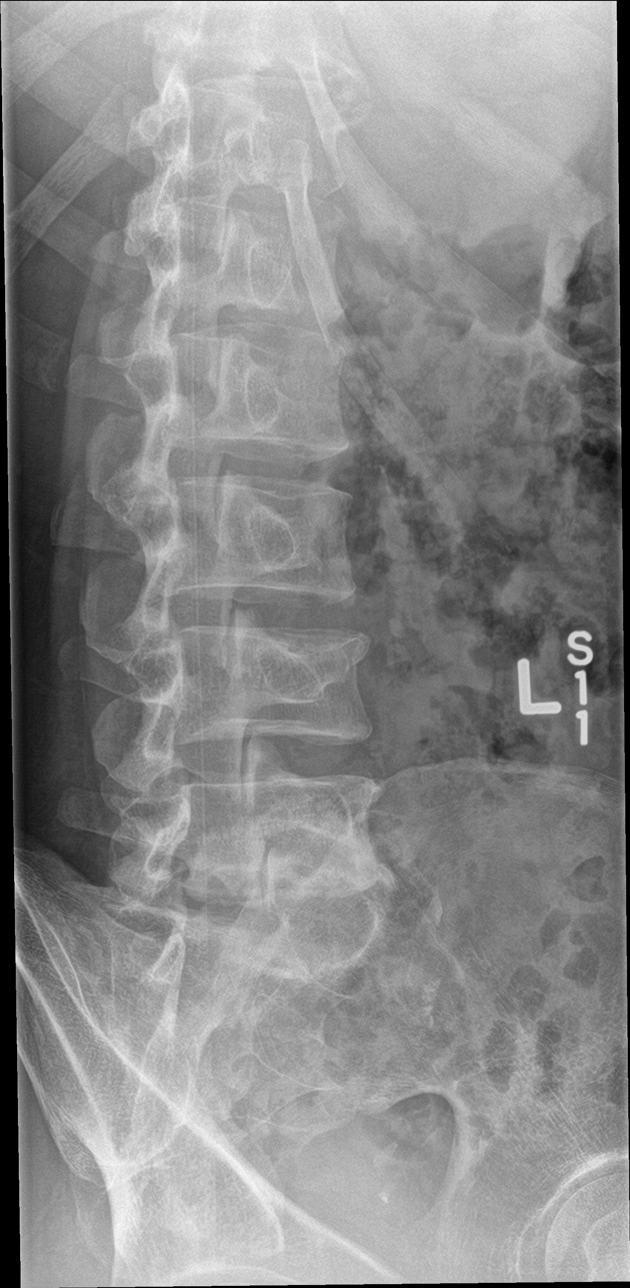

[l-spine lat]
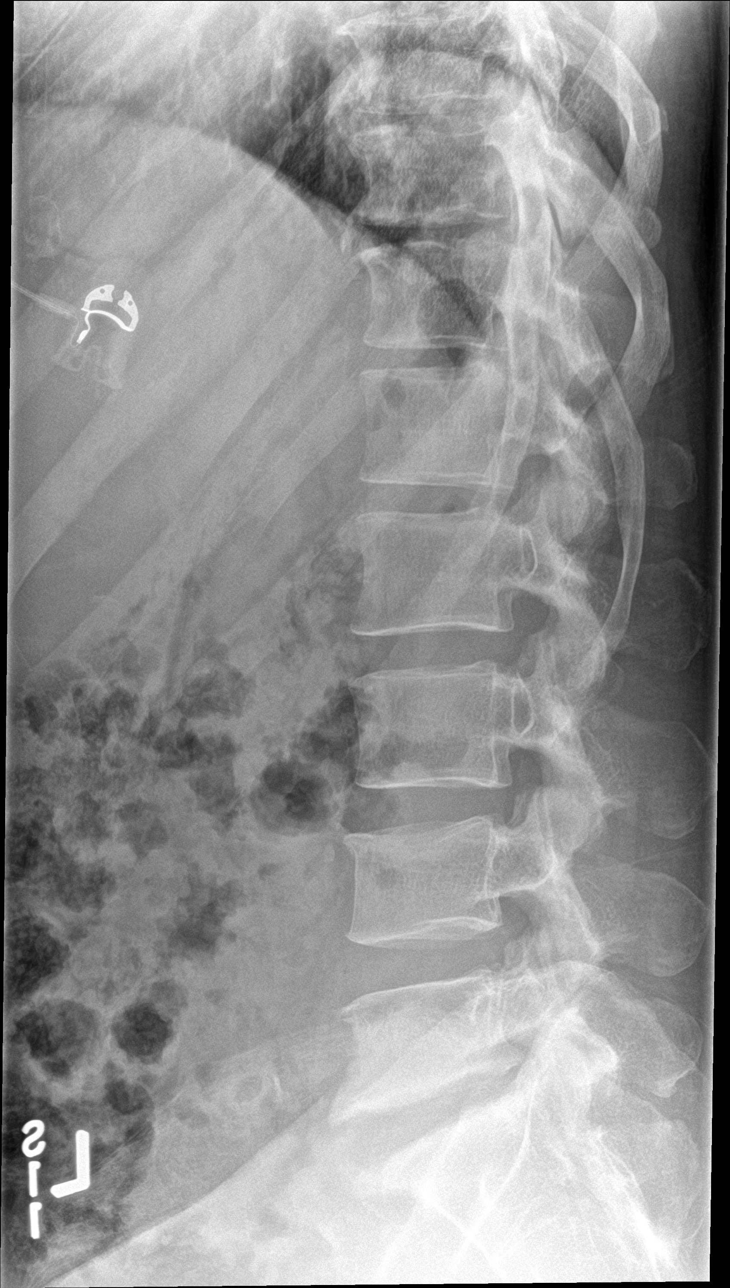

[l-spine spot]
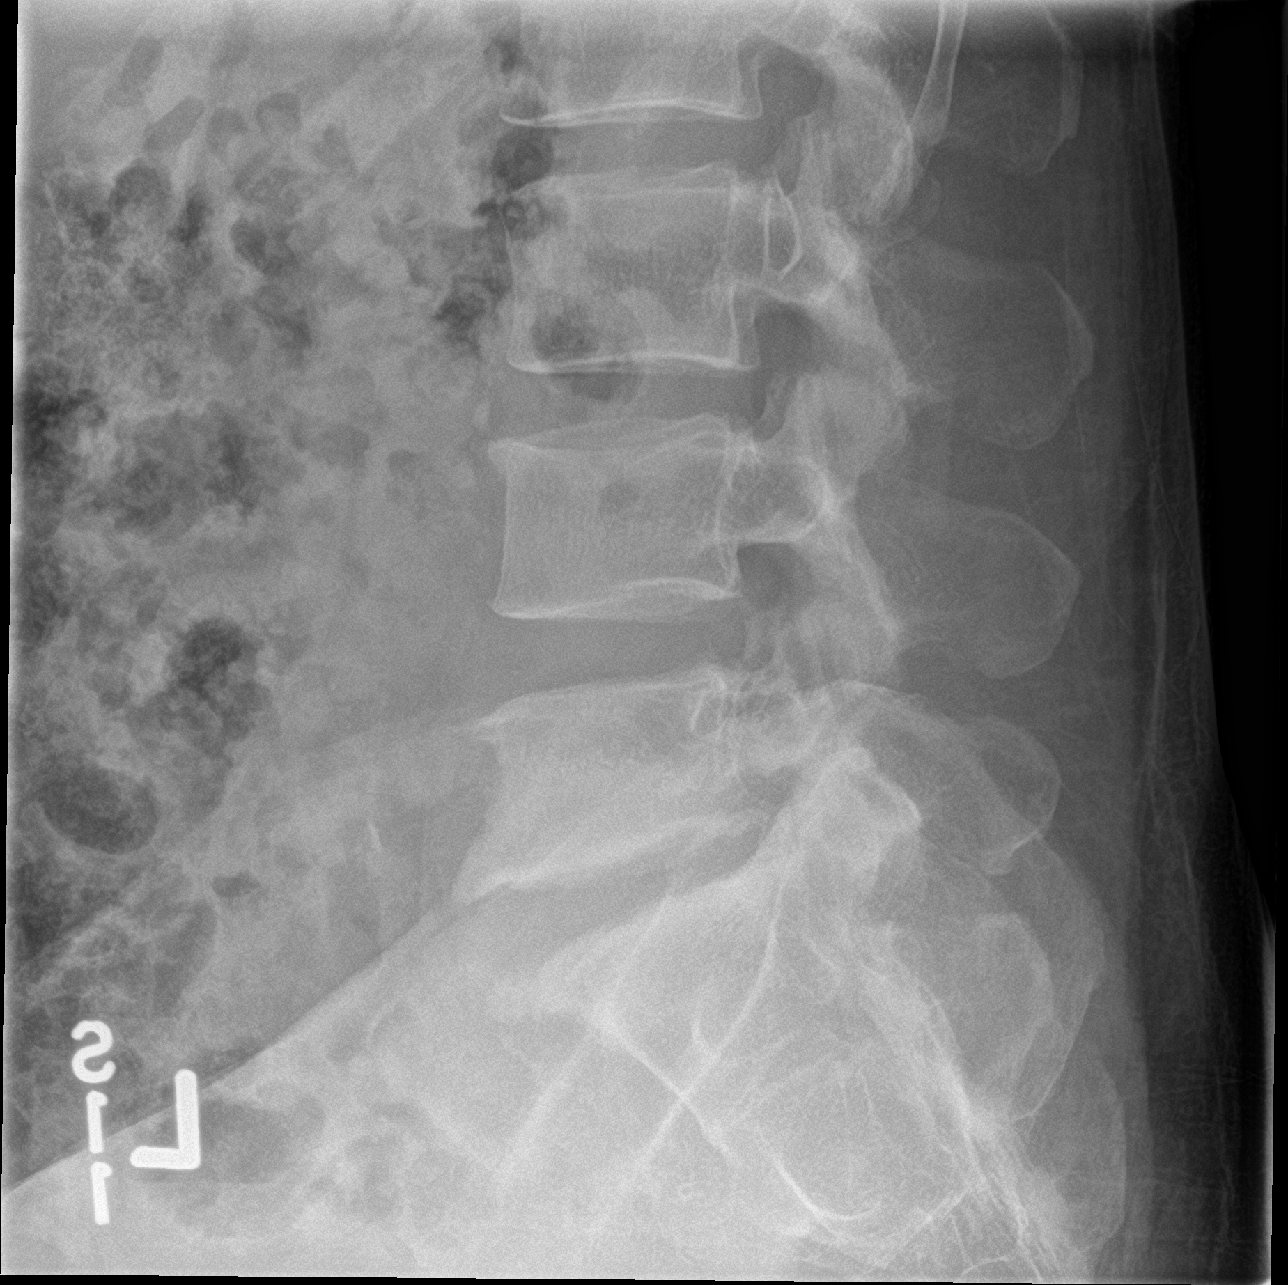

[5 of 5 positions shown; findings below may reference images not displayed]

FINDINGS: No evidence of acute fracture or malalignment. Focal L5-S1
degenerative disc disease with mild narrowing of the disc space,
endplate sclerosis and productive osteophyte formation. The
remainder the visualized spine is unremarkable. There is some loss
of of the normal lumbar lordosis. No lytic or blastic osseous
lesion. Visualized bowel gas pattern is unremarkable.
IMPRESSION: 1. Loss of the normal lumbar lordosis may be related to muscle
spasm.
2. Mild localized degenerative disc disease at L5-S1.

## 2018-04-07 ENCOUNTER — Other Ambulatory Visit: Payer: Self-pay

## 2018-04-07 ENCOUNTER — Other Ambulatory Visit (INDEPENDENT_AMBULATORY_CARE_PROVIDER_SITE_OTHER): Payer: Self-pay

## 2018-04-07 DIAGNOSIS — E1165 Type 2 diabetes mellitus with hyperglycemia: Secondary | ICD-10-CM

## 2018-04-07 LAB — BASIC METABOLIC PANEL
BUN: 15 mg/dL (ref 6–23)
CO2: 27 mEq/L (ref 19–32)
Calcium: 10 mg/dL (ref 8.4–10.5)
Chloride: 101 mEq/L (ref 96–112)
Creatinine, Ser: 1.4 mg/dL (ref 0.40–1.50)
GFR: 66.05 mL/min (ref >60.00)
Glucose, Bld: 152 mg/dL — ABNORMAL HIGH (ref 70–99)
Potassium: 4.3 mEq/L (ref 3.5–5.1)
Sodium: 136 mEq/L (ref 135–145)

## 2018-04-07 LAB — HEMOGLOBIN A1C: Hgb A1c MFr Bld: 6.7 % — ABNORMAL HIGH (ref 4.6–6.5)

## 2018-04-07 MED FILL — SPIRONOLACTONE 100 MG TAB: 100 | 60 days supply | Qty: 60 | Fill #0

## 2018-04-14 ENCOUNTER — Other Ambulatory Visit: Payer: Self-pay

## 2018-04-14 ENCOUNTER — Ambulatory Visit (INDEPENDENT_AMBULATORY_CARE_PROVIDER_SITE_OTHER): Payer: Self-pay | Admitting: Endocrinology

## 2018-04-14 ENCOUNTER — Encounter: Payer: Self-pay | Admitting: Endocrinology

## 2018-04-14 DIAGNOSIS — E119 Type 2 diabetes mellitus without complications: Secondary | ICD-10-CM

## 2018-04-14 DIAGNOSIS — I10 Essential (primary) hypertension: Secondary | ICD-10-CM

## 2018-04-14 MED ORDER — METFORMIN HCL ER 500 MG PO TB24: 500.0000 mg | ORAL_TABLET | Freq: Two times a day (BID) | ORAL | 3 refills | Status: DC

## 2018-04-14 NOTE — Progress Notes (Signed)
Patient ID: Steven Higgins, male   DOB: 12-26-1970, 48 y.o.   MRN: [id number]              Today's office visit was provided via telemedicine using video technique . Consent for the telehealth visit has been obtained from the patient . Location of the patient: Home . Location of the provider: Office Only the patient and myself were participating in the encounter    Chief complaint: Endocrinology follow-up  History of Present Illness:  Background information: His hypertension was diagnosed about 10 or 11 years ago and details of his diagnosis and treatment are unknown He had been treated for this for several years but about 2 years ago he decided to stop his medications on his own especially because he was getting cough from lisinopril. He did not have any follow-up for his hypertension even though he thinks his blood pressure was running around 180 He was admitted to the hospital in 03/2016 with than intracerebral hemorrhage and his blood pressure was markedly increased at that time.  He was treated aggressively and blood pressure apparently improved with regimen of amlodipine, labetalol and lisinopril  He was also found to have persistent HYPOKALEMIA in the hospital with potassium levels as low as 2.5   Magnesium level was low normal at 1.7  On his initial evaluation 24-hour urine potassium was ordered and this was significantly high at 57 despite having hypokalemia SALT LOADING test: His urine aldosterone was high at 30.6 and urine sodium was 251 on the second attempt However his CT scan of the adrenal gland did not show any abnormality bilaterally He also had a renal artery CT arteriogram done by the cardiologist clinic even though he does not have a bruit and this was negative  CURRENT history:  For his hypertension and hypokalemia he has been on spironolactone 100 mg daily without any potassium supplements He may have adrenal hyperplasia causing hyperaldosteronism  Potassium  has been consistently normal without any additional potassium supplements  He was told to take only half tablet of doxazosin on his last visit because of low normal blood pressure at times With this he will not feel lightheaded or dizzy only except if he is stooping and getting up  Although he was taking labetalol previously he ran out of this 3 weeks ago but he does not think his blood pressure is any higher Previously also was taking carvedilol from cardiologist and has not taken this for quite some time He takes his doxazosin and Erbe Sartain at night  His blood pressure monitor was previously checked for comparison in the office and was found to be relatively accurate His blood pressure has been checked at home fairly regularly  He thinks his blood pressure is relatively higher in the morning up to about 146 systolic In the evening it is as low as about 120 systolic He does not think his diastolic reading is over 80 usually and as low as 69   BP Readings from Last 3 Encounters:  01/13/18 138/84  10/07/17 140/80  06/07/17 128/86    Recent weight history:  Wt Readings from Last 3 Encounters:  01/13/18 210 lb (95.3 kg)  10/07/17 205 lb 12.8 oz (93.4 kg)  06/07/17 211 lb 6.4 oz (95.9 kg)    Lab Results  Component Value Date   CREATININE 1.40 04/07/2018   BUN 15 04/07/2018   NA 136 04/07/2018   K 4.3 04/07/2018   CL 101 04/07/2018   CO2 27 04/07/2018  Past Medical History:  Diagnosis Date  . CAD (coronary artery disease), native coronary artery 01/22/2017   coronary Ca score of 143 and mild nonobstructive plaque in the RCA that is tortuous with aneurysmal dilatation and minimal plaque in the LAD and LCx by coronary CTA 07/2016  . Carotid artery stenosis 04/04/2016   1-39% bilateral by dopplers 03/2016  . DCM (dilated cardiomyopathy) (HCC)    EF 40-45% by echo 03/2016 presumed due to HTN  . Hyperlipidemia LDL goal <70   . Hypertensive heart disease with CHF (HCC)   .  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 Renal artery stenosis (HCC) 04/04/2016   By renal dopplers 03/2016 1-59%  . Seizure disorder (HCC)    as sequela of small intracerebral bleed    History reviewed. No pertinent surgical history.  Family History  Problem Relation Age of Onset  . Diabetes Mother   . Heart disease Mother   . Hypertension Mother   . Heart disease Father   . Heart attack Father   . Hypertension Father   . Hypertension Maternal Grandmother   . Diabetes Maternal Grandmother   . Hypertension Paternal Grandmother     Social History:  reports that he has been smoking cigarettes. He has been smoking about 0.25 packs per day. He has never used smokeless tobacco. He reports that he does not drink alcohol or use drugs.  Allergies: No Known Allergies  Allergies as of 04/14/2018   No Known Allergies     Medication List       Accurate as of April 14, 2018  1:26 PM. Always use your most recent med list.        carvedilol 25 MG tablet Commonly known as:  COREG Take 1 tablet (25 mg total) by mouth 2 (two) times daily with a meal.   doxazosin 4 MG tablet Commonly known as:  CARDURA Take 1 tablet (4 mg total) by mouth daily.   irbesartan 150 MG tablet Commonly known as:  AVAPRO Take 150 mg by mouth daily.   levETIRAcetam 500 MG tablet Commonly known as:  KEPPRA TAKE 1 TABLET BY MOUTH TWICE DAILY   spironolactone 100 MG tablet Commonly known as:  ALDACTONE TAKE 1 TABLET BY MOUTH DAILY.       LABS:  No visits with results within 1 Week(s) from this visit.  Latest known visit with results is:  Lab on 04/07/2018  Component Date Value Ref Range Status  . Sodium 04/07/2018 136  135 - 145 mEq/L Final  . Potassium 04/07/2018 4.3  3.5 - 5.1 mEq/L Final  . Chloride 04/07/2018 101  96 - 112 mEq/L Final  . CO2 04/07/2018 27  19 - 32 mEq/L Final  . Glucose, Bld 04/07/2018 152* 70 - 99 mg/dL Final  . BUN 11/17/3565 15  6 - 23 mg/dL Final  .  Creatinine, Ser 04/07/2018 1.40  0.40 - 1.50 mg/dL Final  . Calcium 01/41/0301 10.0  8.4 - 10.5 mg/dL Final  . GFR 31/43/8887 66.05  >60.00 mL/min Final  . Hgb A1c MFr Bld 04/07/2018 6.7* 4.6 - 6.5 % Final   Glycemic Control Guidelines for People with Diabetes:Non Diabetic:  <6%Goal of Therapy: <7%Additional Action Suggested:  >8%         Review of Systems  Still has not established with a PCP   GLUCOSE levels: He has had blood sugars as high as 171  Previously his A1c had been 6.1 but now it is  consistently 6.7  He was recommended metformin ER on his last visit but he took this only for a few days and stopped as he thought he could do better with diet alone He thinks his weight is about the same Usually watching sweets and avoiding sugar drinks Blood sugar after eating a bagel in the morning was 152   Lab Results  Component Value Date   HGBA1C 6.7 (H) 04/07/2018   HGBA1C 6.7 (H) 01/06/2018   HGBA1C 6.1 (H) 03/16/2016   Lab Results  Component Value Date   LDLCALC 68 09/27/2016   CREATININE 1.40 04/07/2018      PHYSICAL EXAM:  There were no vitals taken for this visit.     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 without abnormality on CT of the adrenals  Blood pressure is recently well controlled although he is not taking all his prescriptions  Currently only taking doxazosin, Aldactone and Avapro  However unable to confirm his blood pressure reading in the office    Hypokalemia: Controlled with 100 mg of Aldactone, currently potassium again normal  DIABETES: Not clear if he has diabetes as he has not had fasting blood sugars but his A1c has been over 6.5 He did not take the metformin on the last visit and does not understand the need to reduce any chances of progression He is not overweight and his diet is fairly good   PLAN:  No change in antihypertensives or Aldactone To call  if systolic blood pressure consistently stays over 140 including in the evening  He will start the metformin he has at home with 500 mg once a day at dinnertime and then 1000 mg after 1 week if tolerated  To have fasting glucose on the next visit   There are no Patient Instructions on file for this visit.  Reather LittlerAjay Sherina Stammer 04/14/2018, 1:26 PM

## 2018-04-18 MED FILL — metFORMIN HCL ER 500 MG TB2: 500 | 30 days supply | Qty: 60 | Fill #0

## 2018-05-16 ENCOUNTER — Encounter: Payer: Self-pay | Admitting: Physician Assistant

## 2018-05-16 ENCOUNTER — Telehealth: Payer: Self-pay | Admitting: Physician Assistant

## 2018-05-16 NOTE — Progress Notes (Signed)
Virtual Visit via Video Note   This visit type was conducted due to national recommendations for restrictions regarding the COVID-19 Pandemic (e.g. social distancing) in an effort to limit this patient's exposure and mitigate transmission in our community.  Due to his co-morbid illnesses, this patient is at least at moderate risk for complications without adequate follow up.  This format is felt to be most appropriate for this patient at this time.  All issues noted in this document were discussed and addressed.  A limited physical exam was performed with this format.  Please refer to the patient's chart for his consent to telehealth for [facility].   Date:  05/19/2018   ID:  Steven Higgins, DOB Sep 12, 1970, MRN [id number]  Patient Location: Home Provider Location: Home  PCP:  Patient, No Pcp Per  Cardiologist:  Armanda Magic, MD  Electrophysiologist:  None   Evaluation Performed:  Follow-Up Visit  Chief Complaint:  F/u NICM, CAD  History of Present Illness:    Steven Higgins is a 48 y.o. 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   He was initially referred to cardiology in 2018 for cardiomyopathy. In 03/2016 he'd been admitted with LOC. He was severely hypertensive, hypokalemic, and had a small hemorrhage in the left parietal lobe secondary to hypertensive microvascular disease on CT.  Carotid dopplers showed mild 1-39% nonobstructive disease. 2D echo showed EF 40-45%, diffuse HK, mild LVH, moderate LAE/RAE, normla RV. Renal dopplers showed 1-59% renal artery stenosis bilaterally. He had a nonischemic nuc in 04/2016. Cardiac CTA 07/2016 showed coronary Ca score of 143 and mild nonobstructive plaque in the RCA that  is tortuous with aneurysmal dilatation and minimal plaque in the LAD and LCx as well as mildly dilated pulmonary artery (although recent echo did not show any pulm HTN). CT angio abdomen showed no evidence of significant renal artery stenosis in 02/2017 but did show minimal amount of mixed calcified and noncalcified atherosclerotic plaque within a mildly tortuous and ectatic abdominal aorta measuring 2.5 cm in diameter with recommendation to repeat in 2024. Last labs 04/2018 showed K 4.3, Cr 1.4 (c/w prior), A1C 6.7, lipid profile 09/2016 with LDL 68, 03/2016 CBC wnl. Regarding prior meds, he had prior cough with lisinopril.  He reports from cardiac standpoint he is doing well without CP, SOB, orthopnea, PND, edema, weight gain. He does notice his BP fluctuates. He takes his aldactone at night. In the morning his BP is frequently around 160 systolic, going down to the 130s by the end of the day. He is no longer taking irbesartan or carvedilol as he ran out and did not get it refilled. He stopped Keppra because he thought it was making him dizzy - he was asked to discuss with his neurologist. He also was asked to cut his doxazosin to 2mg . The dizziness has improved. He is very concerned about the Covid pandemic and limiting his exposure. The patient does not have symptoms concerning for COVID-19 infection (fever, chills, cough, or new shortness of breath). He expresses desire towards natural approach to his health.    Past Medical History:  Diagnosis Date   CAD (coronary artery disease), native coronary artery 01/22/2017   coronary Ca score of 143 and mild nonobstructive plaque in the  RCA that is tortuous with aneurysmal dilatation and minimal plaque in the LAD and LCx by coronary CTA 07/2016   Carotid artery stenosis 04/04/2016   1-39% bilateral by dopplers 03/2016   Diabetes mellitus (HCC)    Ectatic abdominal aorta (HCC)    a. f/u due 2024.   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 EF 40-45% by echo 03/2016 presumed due to HTN - nuc with no ischemia, nonobstructive CAD by cor CT 07/2016.   Primary hyperaldosteronism (HCC)    Renal artery stenosis (HCC) 04/04/2016   By renal dopplers 03/2016 1-59% but not seen on CT abd 02/2017   Seizure disorder (HCC)    as sequela of small intracerebral bleed   No past surgical history on file.   Current Meds  Medication Sig   doxazosin (CARDURA) 4 MG tablet Take 2 mg by mouth daily.   metFORMIN (GLUCOPHAGE-XR) 500 MG 24 hr tablet Take 1 tablet (500 mg total) by mouth 2 (two) times daily.   spironolactone (ALDACTONE) 100 MG tablet TAKE 1 TABLET BY MOUTH DAILY.     Allergies:   Patient has no known allergies.   Social History   Tobacco Use   Smoking status: Current Every Day Smoker    Packs/day: 0.25    Types: Cigarettes   Smokeless tobacco: Never Used   Tobacco comment: 07/30/16 smoke 3-4 cigarettes per day-not wearing patch  Substance Use Topics   Alcohol use: No    Alcohol/week: 1.0 standard drinks    Types: 1 Cans of beer per week    Frequency: Never   Drug use: No     Family Hx: The patient's family history includes Diabetes in his maternal grandmother and mother; Heart attack in his father; Heart disease in his father and mother; Hypertension in his father, maternal grandmother, mother, and paternal grandmother.  ROS:   Please see the history of present illness.    All other systems reviewed and are negative.   Prior CV studies:   The following studies were reviewed today:  Most recent pertinent cardiac studies are outlined above.   Labs/Other Tests and Data Reviewed:    EKG:  An ECG dated 03/15/16 was personally reviewed today and demonstrated:  NSR 86bpm LVH with secondary repol abnormality nonspecific STT changes    Recent Labs: 04/07/2018: BUN 15; Creatinine, Ser 1.40;  Potassium 4.3; Sodium 136   Recent Lipid Panel Lab Results  Component Value Date/Time   CHOL 135 09/27/2016 08:21 AM   TRIG 87 09/27/2016 08:21 AM   HDL 50 09/27/2016 08:21 AM   CHOLHDL 2.7 09/27/2016 08:21 AM   CHOLHDL 4.3 03/16/2016 07:05 AM   LDLCALC 68 09/27/2016 08:21 AM    Wt Readings from Last 3 Encounters:  05/19/18 204 lb (92.5 kg)  01/13/18 210 lb (95.3 kg)  10/07/17 205 lb 12.8 oz (93.4 kg)     Objective:    Vital Signs:  BP (!) 139/91    Pulse 70    Ht 6\' 6"  (1.981 m)    Wt 204 lb (92.5 kg)    BMI 23.57 kg/m    VITAL SIGNS:  reviewed  General - AAM in no acute distress HEENT - NCAT, EOM intact Pulm - No labored breathing, no coughing during visit, no audible wheezing, speaking in full sentences Neuro - A+Ox3, no  slurred speech, answers questions appropriately Psych - Pleasant affect     ASSESSMENT & PLAN:    1. CAD - had discussion about cardiac risk factors of HTN, DM and statin therapy in presence of atherosclerosis both coronary and aortic. Will restart carvedilol, but at lower dose (3.125mg  BID) given his h/o dizziness, and uptitrate if needed. The patient was instructed to monitor their blood pressure at home and to call if tending to run higher than 130/80 given his cardiomyopathy and comorbidities. I did not resume ARB today given that this would necessitate repeat labs as his Cr is around 1.4 (this is without ARB on board - he reports he hasn't taken in at least 6 months). He expresses a desire to limit contact given his history and the active pandemic and I think this is a reasonable place to start. Given his CAD and DM he should ideally be on a statin. We are now seeing shifts in recommended goal LDL to less than 50. He is agreeable to consider starting a statin, so will send in atorvastatin 20mg  nightly with f/u lipid profile in 3 months. Since he does not have a PCP we can obtain his CBC and CMET at that time as well. He is not on ASA given hx of  intracerebral bleed. 2. NICM - no symptoms to suggest CHF exacerbation. Resume BB as above. Down the road can consider resuming ARB once peak threat of pandemic passes and we can f/u labs more easily. I think he would benefit from a repeat echo at some point but given relatively asymptomatic, hold off for now. Reviewed 2g sodium restriction, 2L fluid restriction, daily weights with patient. 3. Secondary HTN - BP has been higher recently in setting of not taking irbesartan and carvedilol. See above regarding medication plan. 4. Ectatic abdominal aorta - this will need repeat US closer to 2024. Resume low dose BB as above.  COVID-19 Education: The signs and symptoms of COVID-19 were discussed with the patient and how to seek care for testing (follow up with PCP or arrange E-visit).  The importance of social distancing was discussed today.  Time:   Today, I have spent 18 minutes with the patient with telehealth technology discussing the above problems.  I also screened for food insecurity and he declined need.   Medication Adjustments/Labs and Tests Ordered: Current medicines are reviewed at length with the patient today.  Concerns regarding medicines are outlined above.   Disposition:  Follow up 6 months with Dr. Mayford Knifeurner or myself  Signed, Laurann Montanaayna N Alainah Phang, PA-C  05/19/2018 1:48 PM    [facility]

## 2018-05-16 NOTE — Telephone Encounter (Signed)
Left message on voicemail - need to obtain consent for patient , I sent it through Mychart. Also need to confirm patient has bp cuff and scale to obtain vitals for Monday, if patient calls back please put through to my extension

## 2018-05-19 ENCOUNTER — Other Ambulatory Visit: Payer: Self-pay

## 2018-05-19 ENCOUNTER — Encounter: Payer: Self-pay | Admitting: Physician Assistant

## 2018-05-19 ENCOUNTER — Telehealth (INDEPENDENT_AMBULATORY_CARE_PROVIDER_SITE_OTHER): Payer: Self-pay | Admitting: Physician Assistant

## 2018-05-19 VITALS — BP 139/91 | HR 70 | Ht 78.0 in | Wt 204.0 lb

## 2018-05-19 DIAGNOSIS — I2510 Atherosclerotic heart disease of native coronary artery without angina pectoris: Secondary | ICD-10-CM

## 2018-05-19 DIAGNOSIS — I428 Other cardiomyopathies: Secondary | ICD-10-CM

## 2018-05-19 DIAGNOSIS — I152 Hypertension secondary to endocrine disorders: Secondary | ICD-10-CM

## 2018-05-19 DIAGNOSIS — I77811 Abdominal aortic ectasia: Secondary | ICD-10-CM

## 2018-05-19 MED ORDER — CARVEDILOL 3.125 MG PO TABS: 3.1250 mg | ORAL_TABLET | Freq: Two times a day (BID) | ORAL | 1 refills | Status: DC

## 2018-05-19 MED ORDER — CARVEDILOL 3.125 MG PO TABS: 3.1250 mg | ORAL_TABLET | Freq: Two times a day (BID) | ORAL | 3 refills | Status: DC

## 2018-05-19 MED ORDER — ATORVASTATIN CALCIUM 20 MG PO TABS: 20.0000 mg | ORAL_TABLET | Freq: Every evening | ORAL | 3 refills | Status: DC

## 2018-05-19 NOTE — Patient Instructions (Signed)
Medication Instructions:  Your physician has recommended you make the following change in your medication:  1.  START Coreg 3/125 taking 1 tablet twice a day 2,  START Atorvastatin 20 mg taking 1 tablet every evening  If you need a refill on your cardiac medications before your next appointment, please call your pharmacy.   Lab work: 08/19/2018 AT 8:45:  MAKE SURE YOU COME FASTING FOR :  CMET, LIPID, & CBC  If you have labs (blood work) drawn today and your tests are completely normal, you will receive your results only by: Marland Kitchen MyChart Message (if you have MyChart) OR . A paper copy in the mail If you have any lab test that is abnormal or we need to change your treatment, we will call you to review the results.  Testing/Procedures: None ordered  Follow-Up: At [facility], you and your health needs are our priority.  As part of our continuing mission to provide you with exceptional heart care, we have created designated Provider Care Teams.  These Care Teams include your primary Cardiologist (physician) and Advanced Practice Providers (APPs -  Physician Assistants and Nurse Practitioners) who all work together to provide you with the care you need, when you need it. You will need a follow up appointment in 6 months.  Please call our office 2 months in advance to schedule this appointment.  You may see Armanda Magic, MD or one of the following Advanced Practice Providers on your designated Care Team:   [place], PA-C Ronie Spies, PA-C . Jacolyn Reedy, PA-C  Any Other Special Instructions Will Be Listed Below (If Applicable). The cholesterol medicine we'd like to start is called atorvastatin. When you have coronary disease and diabetes, it is important to slow the progression of plaque buildup in your arteries   Please monitor your blood pressure regularly. Call or send a MyChart message if you tend to get readings of greater than 130 on the top number or 80 on the bottom number.     For patients with history of heart muscle weakness, we give them these special instructions:  1. Follow a low-salt diet - you are allowed no more than 2,000mg  of sodium per day. Watch your fluid intake. In general, you should not be taking in more than 2 liters of fluid per day (no more than 8 glasses per day). This includes sources of water in foods like soup, coffee, tea, milk, etc.  2. Weigh yourself on the same scale at same time of day and keep a log.  3. Call your doctor: (Anytime you feel any of the following symptoms)  - 3lb weight gain overnight or 5lb within a few days  - Shortness of breath, with or without a dry hacking cough  - Swelling in the hands, feet or stomach  - If you have to sleep on extra pillows at night in order to breathe   IT IS IMPORTANT TO LET YOUR DOCTOR KNOW EARLY ON IF YOU ARE HAVING SYMPTOMS SO WE CAN HELP YOU!

## 2018-05-19 NOTE — Telephone Encounter (Signed)
Virtual Visit Pre-Appointment Phone Call  "(Name), I am calling you today to discuss your upcoming appointment. We are currently trying to limit exposure to the virus that causes COVID-19 by seeing patients at home rather than in the office."  1. "What is the BEST phone number to call the day of the visit?" - include this in appointment notes  2. "Do you have or have access to (through a family member/friend) a smartphone with video capability that we can use for your visit?" a. If yes - list this number in appt notes as "cell" (if different from BEST phone #) and list the appointment type as a VIDEO visit in appointment notes b. If no - list the appointment type as a PHONE visit in appointment notes  3. Confirm consent - "In the setting of the current Covid19 crisis, you are scheduled for a (phone or video) visit with your provider on (date) at (time).  Just as we do with many in-office visits, in order for you to participate in this visit, we must obtain consent.  If you'd like, I can send this to your mychart (if signed up) or email for you to review.  Otherwise, I can obtain your verbal consent now.  All virtual visits are billed to your insurance company just like a normal visit would be.  By agreeing to a virtual visit, we'd like you to understand that the technology does not allow for your provider to perform an examination, and thus may limit your provider's ability to fully assess your condition. If your provider identifies any concerns that need to be evaluated in person, we will make arrangements to do so.  Finally, though the technology is pretty good, we cannot assure that it will always work on either your or our end, and in the setting of a video visit, we may have to convert it to a phone-only visit.  In either situation, we cannot ensure that we have a secure connection.  Are you willing to proceed?" STAFF: Did the patient verbally acknowledge consent to telehealth visit? Document  YES/NO here: YES  4. Advise patient to be prepared - "Two hours prior to your appointment, go ahead and check your blood pressure, pulse, oxygen saturation, and your weight (if you have the equipment to check those) and write them all down. When your visit starts, your provider will ask you for this information. If you have an Apple Watch or Kardia device, please plan to have heart rate information ready on the day of your appointment. Please have a pen and paper handy nearby the day of the visit as well."  5. Give patient instructions for MyChart download to smartphone OR [email or web address] as below if video visit (depending on what platform provider is using)  6. Inform patient they will receive a phone call 15 minutes prior to their appointment time (may be from unknown caller ID) so they should be prepared to answer    TELEPHONE CALL NOTE  Jd Przybylski has been deemed a candidate for a follow-up tele-health visit to limit community exposure during the Covid-19 pandemic. I spoke with the patient via phone to ensure availability of phone/video source, confirm preferred email & phone number, and discuss instructions and expectations.  I reminded Phong Gallus to be prepared with any vital sign and/or heart rhythm information that could potentially be obtained via home monitoring, at the time of his visit. I reminded [place] Donson to expect a phone call prior to his visit.  Michaelle Copas, CMA 05/19/2018 1:28 PM   INSTRUCTIONS FOR DOWNLOADING THE MYCHART APP TO SMARTPHONE  - The patient must first make sure to have activated MyChart and know their login information - If Apple, go to [employer] and type in MyChart in the search bar and download the app. If Android, ask patient to go to [employer] and type in [place] in the search bar and download the app. The app is free but as with any other app downloads, their phone may require them to verify saved payment information or Apple/Android  password.  - The patient will need to then log into the app with their MyChart username and password, and select [place] as their healthcare provider to link the account. When it is time for your visit, go to the MyChart app, find appointments, and click Begin Video Visit. Be sure to Select Allow for your device to access the Microphone and Camera for your visit. You will then be connected, and your provider will be with you shortly.  **If they have any issues connecting, or need assistance please contact MyChart service desk [phone])**  **If using a computer, in order to ensure the best quality for their visit they will need to use either of the following Internet Browsers: [employer] or Google Chrome**  IF USING DOXIMITY or DOXY.ME - The patient will receive a link just prior to their visit by text.     FULL LENGTH CONSENT FOR TELE-HEALTH VISIT   I hereby voluntarily request, consent and authorize [facility] and its employed or contracted physicians, physician assistants, nurse practitioners or other licensed health care professionals (the Practitioner), to provide me with telemedicine health care services (the "Services") as deemed necessary by the treating Practitioner. I acknowledge and consent to receive the Services by the Practitioner via telemedicine. I understand that the telemedicine visit will involve communicating with the Practitioner through live audiovisual communication technology and the disclosure of certain medical information by electronic transmission. I acknowledge that I have been given the opportunity to request an in-person assessment or other available alternative prior to the telemedicine visit and am voluntarily participating in the telemedicine visit.  I understand that I have the right to withhold or withdraw my consent to the use of telemedicine in the course of my care at any time, without affecting my right to future care or treatment,  and that the Practitioner or I may terminate the telemedicine visit at any time. I understand that I have the right to inspect all information obtained and/or recorded in the course of the telemedicine visit and may receive copies of available information for a reasonable fee.  I understand that some of the potential risks of receiving the Services via telemedicine include:  Marland Kitchen Delay or interruption in medical evaluation due to technological equipment failure or disruption; . Information transmitted may not be sufficient (e.g. poor resolution of images) to allow for appropriate medical decision making by the Practitioner; and/or  . In rare instances, security protocols could fail, causing a breach of personal health information.  Furthermore, I acknowledge that it is my responsibility to provide information about my medical history, conditions and care that is complete and accurate to the best of my ability. I acknowledge that Practitioner's advice, recommendations, and/or decision may be based on factors not within their control, such as incomplete or inaccurate data provided by me or distortions of diagnostic images or specimens that may result from electronic transmissions. I understand that  the practice of medicine is not an exact science and that Practitioner makes no warranties or guarantees regarding treatment outcomes. I acknowledge that I will receive a copy of this consent concurrently upon execution via email to the email address I last provided but may also request a printed copy by calling the office of [place].    I understand that my insurance will be billed for this visit.   I have read or had this consent read to me. . I understand the contents of this consent, which adequately explains the benefits and risks of the Services being provided via telemedicine.  . I have been provided ample opportunity to ask questions regarding this consent and the Services and have had my questions  answered to my satisfaction. . I give my informed consent for the services to be provided through the use of telemedicine in my medical care  By participating in this telemedicine visit I agree to the above.

## 2018-07-28 ENCOUNTER — Other Ambulatory Visit: Payer: Self-pay | Admitting: Endocrinology

## 2018-07-28 MED FILL — SPIRONOLACTONE 100 MG TAB: 100 | 30 days supply | Qty: 30 | Fill #0

## 2018-08-11 ENCOUNTER — Other Ambulatory Visit (INDEPENDENT_AMBULATORY_CARE_PROVIDER_SITE_OTHER): Payer: Self-pay

## 2018-08-11 ENCOUNTER — Other Ambulatory Visit: Payer: Self-pay

## 2018-08-11 DIAGNOSIS — E119 Type 2 diabetes mellitus without complications: Secondary | ICD-10-CM

## 2018-08-11 LAB — BASIC METABOLIC PANEL
BUN: 16 mg/dL (ref 6–23)
CO2: 28 mEq/L (ref 19–32)
Calcium: 10 mg/dL (ref 8.4–10.5)
Chloride: 100 mEq/L (ref 96–112)
Creatinine, Ser: 1.45 mg/dL (ref 0.40–1.50)
GFR: 63.33 mL/min (ref >60.00)
Glucose, Bld: 116 mg/dL — ABNORMAL HIGH (ref 70–99)
Potassium: 3.7 mEq/L (ref 3.5–5.1)
Sodium: 136 mEq/L (ref 135–145)

## 2018-08-11 LAB — HEMOGLOBIN A1C: Hgb A1c MFr Bld: 6.5 % (ref 4.6–6.5)

## 2018-08-14 ENCOUNTER — Other Ambulatory Visit: Payer: Self-pay

## 2018-08-14 ENCOUNTER — Encounter: Payer: Self-pay | Admitting: Endocrinology

## 2018-08-14 ENCOUNTER — Ambulatory Visit (INDEPENDENT_AMBULATORY_CARE_PROVIDER_SITE_OTHER): Payer: Self-pay | Admitting: Endocrinology

## 2018-08-14 DIAGNOSIS — I10 Essential (primary) hypertension: Secondary | ICD-10-CM

## 2018-08-14 DIAGNOSIS — R7301 Impaired fasting glucose: Secondary | ICD-10-CM

## 2018-08-14 MED ORDER — IRBESARTAN 150 MG PO TABS: 150.0000 mg | ORAL_TABLET | Freq: Every day | ORAL | 2 refills | Status: DC

## 2018-08-14 MED FILL — IRBESARTAN 150 MG TABLET: 150 | 30 days supply | Qty: 30 | Fill #0
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
## 2018-08-19 ENCOUNTER — Other Ambulatory Visit: Payer: Self-pay

## 2018-10-06 MED FILL — SPIRONOLACTONE 100 MG TAB: 100 | 30 days supply | Qty: 30 | Fill #1

## 2018-11-14 ENCOUNTER — Other Ambulatory Visit: Payer: Self-pay

## 2018-11-19 ENCOUNTER — Ambulatory Visit: Payer: Self-pay | Admitting: Endocrinology

## 2020-01-04 ENCOUNTER — Other Ambulatory Visit: Payer: Self-pay | Admitting: Endocrinology

## 2020-01-04 DIAGNOSIS — R7301 Impaired fasting glucose: Secondary | ICD-10-CM

## 2020-01-04 DIAGNOSIS — I10 Essential (primary) hypertension: Secondary | ICD-10-CM

## 2020-01-08 ENCOUNTER — Other Ambulatory Visit: Payer: Self-pay

## 2020-01-11 ENCOUNTER — Other Ambulatory Visit: Payer: Self-pay

## 2020-01-11 ENCOUNTER — Other Ambulatory Visit (INDEPENDENT_AMBULATORY_CARE_PROVIDER_SITE_OTHER): Payer: Self-pay

## 2020-01-11 ENCOUNTER — Ambulatory Visit: Payer: Self-pay | Admitting: Endocrinology

## 2020-01-11 DIAGNOSIS — R7301 Impaired fasting glucose: Secondary | ICD-10-CM

## 2020-01-11 DIAGNOSIS — I10 Essential (primary) hypertension: Secondary | ICD-10-CM

## 2020-01-11 LAB — BASIC METABOLIC PANEL
BUN: 15 mg/dL (ref 6–23)
CO2: 30 mEq/L (ref 19–32)
Calcium: 9.9 mg/dL (ref 8.4–10.5)
Chloride: 98 mEq/L (ref 96–112)
Creatinine, Ser: 1.29 mg/dL (ref 0.40–1.50)
GFR: 65.96 mL/min (ref >60.00)
Glucose, Bld: 92 mg/dL (ref 70–99)
Potassium: 3.5 mEq/L (ref 3.5–5.1)
Sodium: 135 mEq/L (ref 135–145)

## 2020-01-11 LAB — HEMOGLOBIN A1C: Hgb A1c MFr Bld: 6.6 % — ABNORMAL HIGH (ref 4.6–6.5)

## 2020-01-14 ENCOUNTER — Ambulatory Visit: Payer: Self-pay | Admitting: Endocrinology

## 2020-11-19 ENCOUNTER — Emergency Department (HOSPITAL_COMMUNITY): Payer: No Typology Code available for payment source

## 2020-11-19 ENCOUNTER — Encounter (HOSPITAL_COMMUNITY): Payer: Self-pay

## 2020-11-19 ENCOUNTER — Other Ambulatory Visit: Payer: Self-pay

## 2020-11-19 ENCOUNTER — Observation Stay (HOSPITAL_COMMUNITY)
Admission: EM | Admit: 2020-11-19 | Discharge: 2020-11-21 | Disposition: A | Discharge status: Home / Self Care | Payer: No Typology Code available for payment source | Attending: Internal Medicine | Admitting: Internal Medicine

## 2020-11-19 ENCOUNTER — Observation Stay (HOSPITAL_COMMUNITY): Payer: No Typology Code available for payment source

## 2020-11-19 DIAGNOSIS — Z23 Encounter for immunization: Secondary | ICD-10-CM | POA: Diagnosis not present

## 2020-11-19 DIAGNOSIS — R7989 Other specified abnormal findings of blood chemistry: Secondary | ICD-10-CM

## 2020-11-19 DIAGNOSIS — F17210 Nicotine dependence, cigarettes, uncomplicated: Secondary | ICD-10-CM | POA: Diagnosis not present

## 2020-11-19 DIAGNOSIS — I2510 Atherosclerotic heart disease of native coronary artery without angina pectoris: Secondary | ICD-10-CM | POA: Diagnosis not present

## 2020-11-19 DIAGNOSIS — Z79899 Other long term (current) drug therapy: Secondary | ICD-10-CM | POA: Diagnosis not present

## 2020-11-19 DIAGNOSIS — Y92410 Unspecified street and highway as the place of occurrence of the external cause: Secondary | ICD-10-CM | POA: Diagnosis not present

## 2020-11-19 DIAGNOSIS — E119 Type 2 diabetes mellitus without complications: Secondary | ICD-10-CM | POA: Diagnosis not present

## 2020-11-19 DIAGNOSIS — G40909 Epilepsy, unspecified, not intractable, without status epilepticus: Secondary | ICD-10-CM | POA: Diagnosis present

## 2020-11-19 DIAGNOSIS — I110 Hypertensive heart disease with heart failure: Secondary | ICD-10-CM | POA: Diagnosis not present

## 2020-11-19 DIAGNOSIS — Y900 Blood alcohol level of less than 20 mg/100 ml: Secondary | ICD-10-CM | POA: Insufficient documentation

## 2020-11-19 DIAGNOSIS — I10 Essential (primary) hypertension: Secondary | ICD-10-CM

## 2020-11-19 DIAGNOSIS — R569 Unspecified convulsions: Secondary | ICD-10-CM

## 2020-11-19 DIAGNOSIS — K7682 Hepatic encephalopathy: Secondary | ICD-10-CM

## 2020-11-19 DIAGNOSIS — I5022 Chronic systolic (congestive) heart failure: Secondary | ICD-10-CM | POA: Insufficient documentation

## 2020-11-19 DIAGNOSIS — Z20822 Contact with and (suspected) exposure to covid-19: Secondary | ICD-10-CM | POA: Insufficient documentation

## 2020-11-19 LAB — CBC WITH DIFFERENTIAL/PLATELET
Abs Immature Granulocytes: 0.24 10*3/uL — ABNORMAL HIGH (ref 0.00–0.07)
Basophils Absolute: 0.1 10*3/uL (ref 0.0–0.1)
Basophils Relative: 1 %
Eosinophils Absolute: 0 10*3/uL (ref 0.0–0.5)
Eosinophils Relative: 0 %
HCT: 54.1 % — ABNORMAL HIGH (ref 39.0–52.0)
Hemoglobin: 15.8 g/dL (ref 13.0–17.0)
Immature Granulocytes: 2 %
Lymphocytes Relative: 34 %
Lymphs Abs: 4.2 10*3/uL — ABNORMAL HIGH (ref 0.7–4.0)
MCH: 26.2 pg (ref 26.0–34.0)
MCHC: 29.2 g/dL — ABNORMAL LOW (ref 30.0–36.0)
MCV: 89.9 fL (ref 80.0–100.0)
Monocytes Absolute: 1 10*3/uL (ref 0.1–1.0)
Monocytes Relative: 8 %
Neutro Abs: 6.8 10*3/uL (ref 1.7–7.7)
Neutrophils Relative %: 55 %
Platelets: 368 10*3/uL (ref 150–400)
RBC: 6.02 MIL/uL — ABNORMAL HIGH (ref 4.22–5.81)
RDW: 13.2 % (ref 11.5–15.5)
WBC: 12.3 10*3/uL — ABNORMAL HIGH (ref 4.0–10.5)
nRBC: 0 % (ref 0.0–0.2)

## 2020-11-19 LAB — BASIC METABOLIC PANEL
Anion gap: 29 — ABNORMAL HIGH (ref 5–15)
Anion gap: 12 (ref 5–15)
BUN: 9 mg/dL (ref 6–20)
BUN: 9 mg/dL (ref 6–20)
CO2: 8 mmol/L — ABNORMAL LOW (ref 22–32)
CO2: 22 mmol/L (ref 22–32)
Calcium: 10.1 mg/dL (ref 8.9–10.3)
Calcium: 9.6 mg/dL (ref 8.9–10.3)
Chloride: 103 mmol/L (ref 98–111)
Chloride: 104 mmol/L (ref 98–111)
Creatinine, Ser: 1.94 mg/dL — ABNORMAL HIGH (ref 0.61–1.24)
Creatinine, Ser: 1.62 mg/dL — ABNORMAL HIGH (ref 0.61–1.24)
GFR, Estimated: 42 mL/min — ABNORMAL LOW (ref >60)
GFR, Estimated: 52 mL/min — ABNORMAL LOW (ref >60)
Glucose, Bld: 201 mg/dL — ABNORMAL HIGH (ref 70–99)
Glucose, Bld: 128 mg/dL — ABNORMAL HIGH (ref 70–99)
Potassium: 4 mmol/L (ref 3.5–5.1)
Potassium: 3.6 mmol/L (ref 3.5–5.1)
Sodium: 140 mmol/L (ref 135–145)
Sodium: 138 mmol/L (ref 135–145)

## 2020-11-19 LAB — HEPATIC FUNCTION PANEL
ALT: 18 U/L (ref 0–44)
AST: 31 U/L (ref 15–41)
Albumin: 4.5 g/dL (ref 3.5–5.0)
Alkaline Phosphatase: 78 U/L (ref 38–126)
Bilirubin, Direct: 0.1 mg/dL (ref 0.0–0.2)
Indirect Bilirubin: 0.6 mg/dL (ref 0.3–0.9)
Total Bilirubin: 0.7 mg/dL (ref 0.3–1.2)
Total Protein: 8.4 g/dL — ABNORMAL HIGH (ref 6.5–8.1)

## 2020-11-19 LAB — HIV ANTIBODY (ROUTINE TESTING W REFLEX): HIV Screen 4th Generation wRfx: NONREACTIVE

## 2020-11-19 LAB — BLOOD GAS, VENOUS
Acid-base deficit: 0.9 mmol/L (ref 0.0–2.0)
Bicarbonate: 23.7 mmol/L (ref 20.0–28.0)
Drawn by: 3468
FIO2: 97
O2 Saturation: 92.8 %
Patient temperature: 36.5
pCO2, Ven: 41.6 mmHg — ABNORMAL LOW (ref 44.0–60.0)
pH, Ven: 7.371 (ref 7.250–7.430)
pO2, Ven: 68.4 mmHg — ABNORMAL HIGH (ref 32.0–45.0)

## 2020-11-19 LAB — TSH: TSH: 1.312 u[IU]/mL (ref 0.350–4.500)

## 2020-11-19 LAB — RESP PANEL BY RT-PCR (FLU A&B, COVID) ARPGX2
Influenza A by PCR: NEGATIVE
Influenza B by PCR: NEGATIVE
SARS Coronavirus 2 by RT PCR: NEGATIVE

## 2020-11-19 LAB — PROTIME-INR
INR: 1.2 (ref 0.8–1.2)
Prothrombin Time: 15.1 seconds (ref 11.4–15.2)

## 2020-11-19 LAB — AMMONIA: Ammonia: 240 umol/L — ABNORMAL HIGH (ref 9–35)

## 2020-11-19 LAB — ETHANOL: Alcohol, Ethyl (B): <10 mg/dL (ref <10)

## 2020-11-19 LAB — CBG MONITORING, ED: Glucose-Capillary: 199 mg/dL — ABNORMAL HIGH (ref 70–99)

## 2020-11-19 MED ORDER — LORAZEPAM 2 MG/ML IJ SOLN: 4.0000 mg | INTRAMUSCULAR | Status: DC | PRN

## 2020-11-19 MED ORDER — SODIUM CHLORIDE 0.9 % IV SOLN: 75.0000 mL/h | INTRAVENOUS | Status: DC

## 2020-11-19 MED ORDER — LEVETIRACETAM 500 MG PO TABS: 500.0000 mg | ORAL_TABLET | Freq: Two times a day (BID) | ORAL | Status: DC

## 2020-11-19 MED ORDER — AMLODIPINE BESYLATE 5 MG PO TABS
5.0000 mg | ORAL_TABLET | Freq: Every day | ORAL | Status: DC
  Administered 2020-11-19: 5 mg via ORAL
  Filled 2020-11-19: qty 1

## 2020-11-19 MED ORDER — SPIRONOLACTONE 100 MG PO TABS: 100.0000 mg | ORAL_TABLET | Freq: Every day | ORAL | Status: DC

## 2020-11-19 MED ORDER — ORAL CARE MOUTH RINSE
15.0000 mL | OROMUCOSAL | Status: DC
  Administered 2020-11-19 – 2020-11-21 (×11): 15 mL via OROMUCOSAL

## 2020-11-19 MED ORDER — LACTULOSE 10 GM/15ML PO SOLN
30.0000 g | Freq: Once | ORAL | Status: AC
  Administered 2020-11-19: 30 g via ORAL
  Filled 2020-11-19: qty 60

## 2020-11-19 MED ORDER — LORAZEPAM 2 MG/ML IJ SOLN
INTRAMUSCULAR | Status: AC
  Filled 2020-11-19: qty 1

## 2020-11-19 MED ORDER — ONDANSETRON HCL 4 MG PO TABS: 4.0000 mg | ORAL_TABLET | Freq: Four times a day (QID) | ORAL | Status: DC | PRN

## 2020-11-19 MED ORDER — CARVEDILOL 3.125 MG PO TABS: 3.1250 mg | ORAL_TABLET | Freq: Two times a day (BID) | ORAL | Status: DC

## 2020-11-19 MED ORDER — IRBESARTAN 150 MG PO TABS
150.0000 mg | ORAL_TABLET | Freq: Every day | ORAL | Status: DC
Start: 2020-11-19 — End: 2020-11-19

## 2020-11-19 MED ORDER — ATORVASTATIN CALCIUM 10 MG PO TABS
20.0000 mg | ORAL_TABLET | Freq: Every evening | ORAL | Status: DC
  Administered 2020-11-19 – 2020-11-20 (×2): 20 mg via ORAL
  Filled 2020-11-19 (×3): qty 2

## 2020-11-19 MED ORDER — CARVEDILOL 6.25 MG PO TABS
6.2500 mg | ORAL_TABLET | Freq: Two times a day (BID) | ORAL | Status: DC
  Administered 2020-11-20 – 2020-11-21 (×3): 6.25 mg via ORAL
  Filled 2020-11-19 (×4): qty 1

## 2020-11-19 MED ORDER — LEVETIRACETAM IN NACL 1000 MG/100ML IV SOLN
1000.0000 mg | Freq: Once | INTRAVENOUS | Status: AC
  Administered 2020-11-19: 1000 mg via INTRAVENOUS
  Filled 2020-11-19: qty 100

## 2020-11-19 MED ORDER — SENNOSIDES-DOCUSATE SODIUM 8.6-50 MG PO TABS: 1.0000 | ORAL_TABLET | Freq: Every evening | ORAL | Status: DC | PRN

## 2020-11-19 MED ORDER — SODIUM CHLORIDE 0.9 % IV SOLN: INTRAVENOUS | Status: DC

## 2020-11-19 MED ORDER — TETANUS-DIPHTH-ACELL PERTUSSIS 5-2.5-18.5 LF-MCG/0.5 IM SUSY
0.5000 mL | PREFILLED_SYRINGE | Freq: Once | INTRAMUSCULAR | Status: AC
  Administered 2020-11-19: 0.5 mL via INTRAMUSCULAR
  Filled 2020-11-19: qty 0.5

## 2020-11-19 MED ORDER — CHLORHEXIDINE GLUCONATE 0.12% ORAL RINSE (MEDLINE KIT)
15.0000 mL | Freq: Two times a day (BID) | OROMUCOSAL | Status: DC
  Administered 2020-11-19 – 2020-11-20 (×3): 15 mL via OROMUCOSAL

## 2020-11-19 MED ORDER — ONDANSETRON HCL 4 MG/2ML IJ SOLN: 4.0000 mg | Freq: Four times a day (QID) | INTRAMUSCULAR | Status: DC | PRN

## 2020-11-19 MED ORDER — GADOBUTROL 1 MMOL/ML IV SOLN
10.0000 mL | Freq: Once | INTRAVENOUS | Status: AC | PRN
  Administered 2020-11-19: 10 mL via INTRAVENOUS

## 2020-11-19 MED ORDER — LEVETIRACETAM 500 MG PO TABS
500.0000 mg | ORAL_TABLET | Freq: Two times a day (BID) | ORAL | Status: DC
  Administered 2020-11-19 – 2020-11-21 (×4): 500 mg via ORAL
  Filled 2020-11-19 (×4): qty 1

## 2020-11-19 MED ORDER — ENOXAPARIN SODIUM 30 MG/0.3ML IJ SOSY
30.0000 mg | PREFILLED_SYRINGE | INTRAMUSCULAR | Status: DC
  Administered 2020-11-19: 30 mg via SUBCUTANEOUS
  Filled 2020-11-19: qty 0.3

## 2020-11-19 MED ORDER — IOHEXOL 350 MG/ML SOLN
100.0000 mL | Freq: Once | INTRAVENOUS | Status: AC | PRN
  Administered 2020-11-19: 100 mL via INTRAVENOUS
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
## 2020-11-19 NOTE — ED Notes (Signed)
Patient transported to Ultrasound 
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
## 2020-11-19 NOTE — Plan of Care (Signed)
  Problem: Education: Goal: Knowledge of General Education information will improve Description: Including pain rating scale, medication(s)/side effects and non-pharmacologic comfort measures Outcome: Progressing   Problem: Health Behavior/Discharge Planning: Goal: Ability to manage health-related needs will improve Outcome: Progressing   Problem: Clinical Measurements: Goal: Ability to maintain clinical measurements within normal limits will improve Outcome: Progressing Goal: Will remain free from infection Outcome: Progressing Goal: Diagnostic test results will improve Outcome: Progressing Goal: Respiratory complications will improve Outcome: Progressing Goal: Cardiovascular complication will be avoided Outcome: Progressing   Problem: Activity: Goal: Risk for activity intolerance will decrease Outcome: Progressing   Problem: Nutrition: Goal: Adequate nutrition will be maintained Outcome: Progressing   Problem: Coping: Goal: Level of anxiety will decrease Outcome: Progressing   Problem: Elimination: Goal: Will not experience complications related to bowel motility Outcome: Progressing Goal: Will not experience complications related to urinary retention Outcome: Progressing   Problem: Pain Managment: Goal: General experience of comfort will improve Outcome: Progressing   Problem: Safety: Goal: Ability to remain free from injury will improve Outcome: Progressing   Problem: Skin Integrity: Goal: Risk for impaired skin integrity will decrease Outcome: Progressing   Problem: Education: Goal: Expressions of having a comfortable level of knowledge regarding the disease process will increase Outcome: Progressing   Problem: Coping: Goal: Ability to adjust to condition or change in health will improve Outcome: Progressing Goal: Ability to identify appropriate support needs will improve Outcome: Progressing   Problem: Health Behavior/Discharge Planning: Goal:  Compliance with prescribed medication regimen will improve Outcome: Progressing   Problem: Medication: Goal: Risk for medication side effects will decrease Outcome: Progressing   Problem: Clinical Measurements: Goal: Complications related to the disease process, condition or treatment will be avoided or minimized Outcome: Progressing Goal: Diagnostic test results will improve Outcome: Progressing   Problem: Safety: Goal: Verbalization of understanding the information provided will improve Outcome: Progressing   Problem: Self-Concept: Goal: Ability to verbalize feelings about condition will improve Outcome: Progressing
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
## 2020-11-19 NOTE — ED Notes (Signed)
Patient transported to CT 
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
## 2020-11-19 NOTE — ED Notes (Signed)
[facility] MD at bedside, pt following commands, ao to self only

## 2020-11-20 LAB — BASIC METABOLIC PANEL
Anion gap: 10 (ref 5–15)
BUN: 8 mg/dL (ref 6–20)
CO2: 21 mmol/L — ABNORMAL LOW (ref 22–32)
Calcium: 9.2 mg/dL (ref 8.9–10.3)
Chloride: 106 mmol/L (ref 98–111)
Creatinine, Ser: 1.56 mg/dL — ABNORMAL HIGH (ref 0.61–1.24)
GFR, Estimated: 54 mL/min — ABNORMAL LOW (ref >60)
Glucose, Bld: 116 mg/dL — ABNORMAL HIGH (ref 70–99)
Potassium: 3.9 mmol/L (ref 3.5–5.1)
Sodium: 137 mmol/L (ref 135–145)

## 2020-11-20 LAB — AMMONIA: Ammonia: 39 umol/L — ABNORMAL HIGH (ref 9–35)

## 2020-11-20 MED ORDER — ENOXAPARIN SODIUM 40 MG/0.4ML IJ SOSY
40.0000 mg | PREFILLED_SYRINGE | INTRAMUSCULAR | Status: DC
  Administered 2020-11-20: 40 mg via SUBCUTANEOUS
  Filled 2020-11-20: qty 0.4

## 2020-11-20 MED ORDER — AMLODIPINE BESYLATE 10 MG PO TABS
10.0000 mg | ORAL_TABLET | Freq: Every day | ORAL | Status: DC
  Administered 2020-11-20: 10 mg via ORAL
  Filled 2020-11-20: qty 1

## 2020-11-20 MED ORDER — SODIUM CHLORIDE 0.9 % IV SOLN: INTRAVENOUS | Status: AC

## 2020-11-20 MED ORDER — ISOSORB DINITRATE-HYDRALAZINE 20-37.5 MG PO TABS
1.0000 | ORAL_TABLET | Freq: Three times a day (TID) | ORAL | Status: DC
  Administered 2020-11-20 – 2020-11-21 (×2): 1 via ORAL
  Filled 2020-11-20 (×2): qty 1
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
## 2020-11-20 NOTE — Progress Notes (Signed)
Patient's credit cards and debit cards with cash located in the patient's pants pocket. Patient declined sending to security.

## 2020-11-20 NOTE — Plan of Care (Signed)
  Problem: Education: Goal: Knowledge of General Education information will improve Description: Including pain rating scale, medication(s)/side effects and non-pharmacologic comfort measures Outcome: Progressing   Problem: Health Behavior/Discharge Planning: Goal: Ability to manage health-related needs will improve Outcome: Progressing   Problem: Clinical Measurements: Goal: Ability to maintain clinical measurements within normal limits will improve Outcome: Progressing Goal: Will remain free from infection Outcome: Progressing Goal: Diagnostic test results will improve Outcome: Progressing Goal: Respiratory complications will improve Outcome: Progressing Goal: Cardiovascular complication will be avoided Outcome: Progressing   Problem: Activity: Goal: Risk for activity intolerance will decrease Outcome: Progressing   Problem: Nutrition: Goal: Adequate nutrition will be maintained Outcome: Progressing   Problem: Coping: Goal: Level of anxiety will decrease Outcome: Progressing   Problem: Elimination: Goal: Will not experience complications related to bowel motility Outcome: Progressing Goal: Will not experience complications related to urinary retention Outcome: Progressing   Problem: Pain Managment: Goal: General experience of comfort will improve Outcome: Progressing   Problem: Safety: Goal: Ability to remain free from injury will improve Outcome: Progressing   Problem: Skin Integrity: Goal: Risk for impaired skin integrity will decrease Outcome: Progressing   Problem: Education: Goal: Expressions of having a comfortable level of knowledge regarding the disease process will increase Outcome: Progressing   Problem: Coping: Goal: Ability to adjust to condition or change in health will improve Outcome: Progressing Goal: Ability to identify appropriate support needs will improve Outcome: Progressing   Problem: Health Behavior/Discharge Planning: Goal:  Compliance with prescribed medication regimen will improve Outcome: Progressing   Problem: Medication: Goal: Risk for medication side effects will decrease Outcome: Progressing   Problem: Clinical Measurements: Goal: Complications related to the disease process, condition or treatment will be avoided or minimized Outcome: Progressing Goal: Diagnostic test results will improve Outcome: Progressing   Problem: Safety: Goal: Verbalization of understanding the information provided will improve Outcome: Progressing   Problem: Self-Concept: Goal: Ability to verbalize feelings about condition will improve Outcome: Progressing
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
## 2020-11-21 ENCOUNTER — Other Ambulatory Visit (HOSPITAL_COMMUNITY): Payer: Self-pay

## 2020-11-21 LAB — COMPREHENSIVE METABOLIC PANEL
ALT: 12 U/L (ref 0–44)
AST: 18 U/L (ref 15–41)
Albumin: 3.4 g/dL — ABNORMAL LOW (ref 3.5–5.0)
Alkaline Phosphatase: 57 U/L (ref 38–126)
Anion gap: 8 (ref 5–15)
BUN: 12 mg/dL (ref 6–20)
CO2: 24 mmol/L (ref 22–32)
Calcium: 8.8 mg/dL — ABNORMAL LOW (ref 8.9–10.3)
Chloride: 105 mmol/L (ref 98–111)
Creatinine, Ser: 1.58 mg/dL — ABNORMAL HIGH (ref 0.61–1.24)
GFR, Estimated: 54 mL/min — ABNORMAL LOW (ref >60)
Glucose, Bld: 103 mg/dL — ABNORMAL HIGH (ref 70–99)
Potassium: 3.7 mmol/L (ref 3.5–5.1)
Sodium: 137 mmol/L (ref 135–145)
Total Bilirubin: 0.8 mg/dL (ref 0.3–1.2)
Total Protein: 6.2 g/dL — ABNORMAL LOW (ref 6.5–8.1)

## 2020-11-21 LAB — RAPID URINE DRUG SCREEN, HOSP PERFORMED
Amphetamines: NOT DETECTED
Barbiturates: NOT DETECTED
Benzodiazepines: NOT DETECTED
Cocaine: NOT DETECTED
Opiates: NOT DETECTED
Tetrahydrocannabinol: NOT DETECTED

## 2020-11-21 LAB — CBC WITH DIFFERENTIAL/PLATELET
Abs Immature Granulocytes: 0.04 10*3/uL (ref 0.00–0.07)
Basophils Absolute: 0.1 10*3/uL (ref 0.0–0.1)
Basophils Relative: 1 %
Eosinophils Absolute: 0.1 10*3/uL (ref 0.0–0.5)
Eosinophils Relative: 1 %
HCT: 41.8 % (ref 39.0–52.0)
Hemoglobin: 13.6 g/dL (ref 13.0–17.0)
Immature Granulocytes: 1 %
Lymphocytes Relative: 36 %
Lymphs Abs: 2.6 10*3/uL (ref 0.7–4.0)
MCH: 26.8 pg (ref 26.0–34.0)
MCHC: 32.5 g/dL (ref 30.0–36.0)
MCV: 82.3 fL (ref 80.0–100.0)
Monocytes Absolute: 0.6 10*3/uL (ref 0.1–1.0)
Monocytes Relative: 8 %
Neutro Abs: 3.9 10*3/uL (ref 1.7–7.7)
Neutrophils Relative %: 53 %
Platelets: 308 10*3/uL (ref 150–400)
RBC: 5.08 MIL/uL (ref 4.22–5.81)
RDW: 13.5 % (ref 11.5–15.5)
WBC: 7.2 10*3/uL (ref 4.0–10.5)
nRBC: 0 % (ref 0.0–0.2)

## 2020-11-21 LAB — URINALYSIS, ROUTINE W REFLEX MICROSCOPIC
Bacteria, UA: NONE SEEN
Bilirubin Urine: NEGATIVE
Glucose, UA: NEGATIVE mg/dL
Ketones, ur: NEGATIVE mg/dL
Leukocytes,Ua: NEGATIVE
Nitrite: NEGATIVE
Protein, ur: NEGATIVE mg/dL
Specific Gravity, Urine: 1.009 (ref 1.005–1.030)
pH: 5 (ref 5.0–8.0)

## 2020-11-21 LAB — MAGNESIUM: Magnesium: 2.3 mg/dL (ref 1.7–2.4)

## 2020-11-21 MED ORDER — LEVETIRACETAM 500 MG PO TABS
500.0000 mg | ORAL_TABLET | Freq: Two times a day (BID) | ORAL | 1 refills | Status: DC
  Filled 2020-11-21: qty 60, 30d supply, fill #0

## 2020-11-21 MED ORDER — ISOSORBIDE MONONITRATE ER 60 MG PO TB24
60.0000 mg | ORAL_TABLET | Freq: Every day | ORAL | 0 refills | Status: DC
  Filled 2020-11-21: qty 30, 30d supply, fill #0

## 2020-11-21 MED ORDER — ISOSORB DINITRATE-HYDRALAZINE 20-37.5 MG PO TABS
1.0000 | ORAL_TABLET | Freq: Three times a day (TID) | ORAL | 0 refills | Status: DC
  Filled 2020-11-21: qty 90, 30d supply, fill #0

## 2020-11-21 MED ORDER — ATORVASTATIN CALCIUM 20 MG PO TABS
20.0000 mg | ORAL_TABLET | Freq: Every evening | ORAL | 0 refills | Status: DC
  Filled 2020-11-21: qty 30, 30d supply, fill #0

## 2020-11-21 MED ORDER — CARVEDILOL 6.25 MG PO TABS
6.2500 mg | ORAL_TABLET | Freq: Two times a day (BID) | ORAL | 0 refills | Status: DC
  Filled 2020-11-21: qty 60, 30d supply, fill #0

## 2020-11-21 MED ORDER — HYDRALAZINE HCL 25 MG PO TABS
37.5000 mg | ORAL_TABLET | Freq: Three times a day (TID) | ORAL | 0 refills | Status: DC
  Filled 2020-11-21: qty 120, 27d supply, fill #0

## 2020-11-21 NOTE — TOC Transition Note (Signed)
Transition of Care [facility]) - CM/SW Discharge Note   Patient Details  Name: Steven Higgins MRN: [id number] Date of Birth: 08-Apr-1970  Transition of Care [facility]) CM/SW Contact:  Kermit Balo, RN Phone Number: 11/21/2020, 12:28 PM   Clinical Narrative:    Patient is discharging home with self care. Pt is staying in a Motel. CM has arranged transport to get him back to [facility].  Pt has no PCP. CM was able to get him an appointment at [facility] with information on the AVS. Pt can also use [facility] pharmacy for medication assistance.  [facility] provided to assist with the cost of his medication. TOC pharmacy delivered the meds to his room.   Final next level of care: Home/Self Care Barriers to Discharge: Inadequate or no insurance, Barriers Unresolved (comment)   Patient Goals and CMS Choice     Choice offered to / list presented to : Patient  Discharge Placement                       Discharge Plan and Services                                     Social Determinants of Health (SDOH) Interventions     Readmission Risk Interventions No flowsheet data found.

## 2020-11-21 NOTE — Plan of Care (Signed)
  Problem: Education: Goal: Knowledge of General Education information will improve Description: Including pain rating scale, medication(s)/side effects and non-pharmacologic comfort measures Outcome: Adequate for Discharge   Problem: Health Behavior/Discharge Planning: Goal: Ability to manage health-related needs will improve Outcome: Adequate for Discharge   Problem: Clinical Measurements: Goal: Ability to maintain clinical measurements within normal limits will improve Outcome: Adequate for Discharge Goal: Will remain free from infection Outcome: Adequate for Discharge Goal: Diagnostic test results will improve Outcome: Adequate for Discharge Goal: Respiratory complications will improve Outcome: Adequate for Discharge Goal: Cardiovascular complication will be avoided Outcome: Adequate for Discharge   Problem: Activity: Goal: Risk for activity intolerance will decrease Outcome: Adequate for Discharge   Problem: Nutrition: Goal: Adequate nutrition will be maintained Outcome: Adequate for Discharge   Problem: Coping: Goal: Level of anxiety will decrease Outcome: Adequate for Discharge   Problem: Elimination: Goal: Will not experience complications related to bowel motility Outcome: Adequate for Discharge Goal: Will not experience complications related to urinary retention Outcome: Adequate for Discharge   Problem: Pain Managment: Goal: General experience of comfort will improve Outcome: Adequate for Discharge   Problem: Safety: Goal: Ability to remain free from injury will improve Outcome: Adequate for Discharge   Problem: Skin Integrity: Goal: Risk for impaired skin integrity will decrease Outcome: Adequate for Discharge   Problem: Education: Goal: Expressions of having a comfortable level of knowledge regarding the disease process will increase Outcome: Adequate for Discharge   Problem: Coping: Goal: Ability to adjust to condition or change in health will  improve Outcome: Adequate for Discharge Goal: Ability to identify appropriate support needs will improve Outcome: Adequate for Discharge   Problem: Health Behavior/Discharge Planning: Goal: Compliance with prescribed medication regimen will improve Outcome: Adequate for Discharge   Problem: Medication: Goal: Risk for medication side effects will decrease Outcome: Adequate for Discharge   Problem: Clinical Measurements: Goal: Complications related to the disease process, condition or treatment will be avoided or minimized Outcome: Adequate for Discharge Goal: Diagnostic test results will improve Outcome: Adequate for Discharge   Problem: Safety: Goal: Verbalization of understanding the information provided will improve Outcome: Adequate for Discharge   Problem: Self-Concept: Goal: Ability to verbalize feelings about condition will improve Outcome: Adequate for Discharge

## 2020-11-21 NOTE — Discharge Instructions (Signed)
Do not drive, operate heavy machinery, perform activities at heights, swimming or participation in water activities or provide baby sitting services until you have seen by Primary MD or a Neurologist and advised to do so again.  Follow with Primary MD in 7 days   Get CBC, CMP, 2 view Chest X ray -  checked next visit within 1 week by Primary MD    Activity: As tolerated with Full fall precautions use walker/cane & assistance as needed  Disposition Home    Diet: Heart Healthy   Special Instructions: If you have smoked or chewed Tobacco  in the last 2 yrs please stop smoking, stop any regular Alcohol  and or any Recreational drug use.  On your next visit with your primary care physician please Get Medicines reviewed and adjusted.  Please request your Prim.MD to go over all Hospital Tests and Procedure/Radiological results at the follow up, please get all Hospital records sent to your Prim MD by signing hospital release before you go home.  If you experience worsening of your admission symptoms, develop shortness of breath, life threatening emergency, suicidal or homicidal thoughts you must seek medical attention immediately by calling 911 or calling your MD immediately  if symptoms less severe.  You Must read complete instructions/literature along with all the possible adverse reactions/side effects for all the Medicines you take and that have been prescribed to you. Take any new Medicines after you have completely understood and accpet all the possible adverse reactions/side effects.

## 2020-11-21 NOTE — Progress Notes (Signed)
Patient has been given discharge instructions and stated understanding.  Waiting for TOC medications
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
## 2020-11-23 ENCOUNTER — Ambulatory Visit: Payer: Self-pay | Admitting: Nurse Practitioner

## 2020-12-13 ENCOUNTER — Other Ambulatory Visit (HOSPITAL_COMMUNITY): Payer: Self-pay

## 2020-12-13 ENCOUNTER — Other Ambulatory Visit: Payer: Self-pay | Admitting: Internal Medicine

## 2021-01-10 ENCOUNTER — Telehealth: Payer: Self-pay

## 2021-01-10 ENCOUNTER — Ambulatory Visit: Payer: Self-pay | Admitting: Cardiovascular Disease

## 2021-01-10 NOTE — Telephone Encounter (Signed)
Spoke with pt regarding scheduling office visit with Dr. Allyson Sabal. Pt states that he has already made appointment to see another provider for tomorrow. Pt states she he will call back in the future if he needs to see Dr. Allyson Sabal.

## 2021-01-11 ENCOUNTER — Encounter: Payer: Self-pay | Admitting: Nurse Practitioner

## 2021-01-11 ENCOUNTER — Other Ambulatory Visit (HOSPITAL_COMMUNITY): Payer: Self-pay

## 2021-01-11 ENCOUNTER — Other Ambulatory Visit: Payer: Self-pay

## 2021-01-11 ENCOUNTER — Ambulatory Visit (INDEPENDENT_AMBULATORY_CARE_PROVIDER_SITE_OTHER): Payer: Self-pay | Admitting: Nurse Practitioner

## 2021-01-11 VITALS — BP 164/97 | HR 90 | Ht 78.0 in | Wt 215.0 lb

## 2021-01-11 DIAGNOSIS — G40909 Epilepsy, unspecified, not intractable, without status epilepticus: Secondary | ICD-10-CM

## 2021-01-11 DIAGNOSIS — I10 Essential (primary) hypertension: Secondary | ICD-10-CM

## 2021-01-11 DIAGNOSIS — Z0000 Encounter for general adult medical examination without abnormal findings: Secondary | ICD-10-CM

## 2021-01-11 DIAGNOSIS — E785 Hyperlipidemia, unspecified: Secondary | ICD-10-CM

## 2021-01-11 DIAGNOSIS — E1169 Type 2 diabetes mellitus with other specified complication: Secondary | ICD-10-CM

## 2021-01-11 DIAGNOSIS — I611 Nontraumatic intracerebral hemorrhage in hemisphere, cortical: Secondary | ICD-10-CM

## 2021-01-11 DIAGNOSIS — Z5900 Homelessness unspecified: Secondary | ICD-10-CM

## 2021-01-11 LAB — POCT URINALYSIS DIP (CLINITEK)
Bilirubin, UA: NEGATIVE
Blood, UA: NEGATIVE
Glucose, UA: NEGATIVE mg/dL
Ketones, POC UA: NEGATIVE mg/dL
Leukocytes, UA: NEGATIVE
Nitrite, UA: NEGATIVE
POC PROTEIN,UA: NEGATIVE
Spec Grav, UA: 1.02 (ref 1.010–1.025)
Urobilinogen, UA: 0.2 E.U./dL
pH, UA: 5.5 (ref 5.0–8.0)

## 2021-01-11 LAB — POCT GLYCOSYLATED HEMOGLOBIN (HGB A1C): HbA1c, POC (prediabetic range): 6.4 % (ref 5.7–6.4)

## 2021-01-11 LAB — GLUCOSE, POCT (MANUAL RESULT ENTRY): POC Glucose: 140 mg/dl — AB (ref 70–99)

## 2021-01-11 MED ORDER — CARVEDILOL 6.25 MG PO TABS
6.2500 mg | ORAL_TABLET | Freq: Two times a day (BID) | ORAL | 2 refills | Status: DC
  Filled 2021-01-11 (×2): qty 60, 30d supply, fill #0
  Filled 2021-02-08: qty 60, 30d supply, fill #1
  Filled 2021-03-17: qty 60, 30d supply, fill #2

## 2021-01-11 MED ORDER — ATORVASTATIN CALCIUM 20 MG PO TABS
20.0000 mg | ORAL_TABLET | Freq: Every evening | ORAL | 0 refills | Status: DC
  Filled 2021-01-11 (×2): qty 30, 30d supply, fill #0

## 2021-01-11 MED ORDER — LEVETIRACETAM 500 MG PO TABS
500.0000 mg | ORAL_TABLET | Freq: Two times a day (BID) | ORAL | 2 refills | Status: DC
  Filled 2021-01-11 (×2): qty 60, 30d supply, fill #0
  Filled 2021-02-08: qty 60, 30d supply, fill #1

## 2021-01-11 MED ORDER — ISOSORBIDE MONONITRATE ER 60 MG PO TB24
60.0000 mg | ORAL_TABLET | Freq: Every day | ORAL | 5 refills | Status: DC
  Filled 2021-01-11 (×2): qty 30, 30d supply, fill #0
  Filled 2021-02-08: qty 30, 30d supply, fill #1
  Filled 2021-03-17: qty 30, 30d supply, fill #2
  Filled 2021-04-21: qty 30, 30d supply, fill #3
  Filled 2021-05-23: qty 30, 30d supply, fill #4
  Filled 2021-06-19: qty 30, 30d supply, fill #5

## 2021-01-11 MED ORDER — HYDRALAZINE HCL 25 MG PO TABS
37.5000 mg | ORAL_TABLET | Freq: Three times a day (TID) | ORAL | 5 refills | Status: DC
  Filled 2021-01-11 (×2): qty 135, 30d supply, fill #0
  Filled 2021-02-08 (×2): qty 135, 30d supply, fill #1
  Filled 2021-03-17: qty 135, 30d supply, fill #2
  Filled 2021-05-18: qty 135, 30d supply, fill #3
  Filled 2021-06-19: qty 135, 30d supply, fill #4

## 2021-01-11 MED ORDER — METFORMIN HCL 500 MG PO TABS
500.0000 mg | ORAL_TABLET | Freq: Two times a day (BID) | ORAL | 3 refills | Status: DC
  Filled 2021-01-11: qty 180, 90d supply, fill #0
  Filled 2021-01-11: qty 60, 30d supply, fill #0
  Filled 2021-02-08: qty 60, 30d supply, fill #1
  Filled 2021-03-17: qty 60, 30d supply, fill #2
  Filled 2021-04-20: qty 60, 30d supply, fill #3
  Filled 2021-05-23: qty 60, 30d supply, fill #4
  Filled 2021-06-19: qty 60, 30d supply, fill #5
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
## 2021-01-11 NOTE — Progress Notes (Addendum)
Integrated Behavioral Health °Case Management Referral Note ° °01/11/2021 °Name: Steven Higgins MRN: [id number] DOB: 05/17/1970 °[name] is a 50 y.o. year old male who sees Patient, No Pcp Per (Inactive) for primary care. LCSW was consulted to assess patient's needs and assist the patient with Financial Difficulties related to no income . ° °Interpreter: No.   Interpreter Name & Language: none ° °Assessment: Patient experiencing Financial constraints related to no income.  Patient is experiencing homelessness. ° °Intervention: CSW met with patient during PCP visit. Patient experiencing homelessness. He is sleeping in his vehicle. He has medical conditions that may put his drivers license at risk. He lost work due to hospitalization in November. He is uninsured.  ° °Provided patient with [employer] and [employer] applications. Reviewed application and advised patient on supporting documents to be submitted with the application. Advised patient to follow up with CSW for assistance in scheduling appointment with financial counselor at [facility] to submit the application. Provided CSW contact information.   ° °Provided patient with information on the [facility] and advised he go there today as they have a winter shelter. Provided list of other shelters in the area. Patient indicated he may go to stay with his friend out of town, given the situation.  ° °Review of patient status, including review of consultants reports, relevant laboratory and other test results, and collaboration with appropriate care team members and the patient's provider was performed as part of comprehensive patient evaluation and provision of services.   ° °Susan Porter, LCSW °Patient Care Center °[facility] °[phone] °  ° °

## 2021-01-11 NOTE — Patient Instructions (Signed)
You were seen today in the PCC to establish care. Labs were collected, results will be available via MyChart or, if abnormal, you will be contacted by clinic staff. You were prescribed medications, please take as directed. Please follow up in 3 mths for reevaluation of chronic illness.  ?

## 2021-01-12 LAB — LIPID PANEL
Chol/HDL Ratio: 4.2 ratio (ref 0.0–5.0)
Cholesterol, Total: 226 mg/dL — ABNORMAL HIGH (ref 100–199)
HDL: 54 mg/dL (ref >39)
LDL Chol Calc (NIH): 148 mg/dL — ABNORMAL HIGH (ref 0–99)
Triglycerides: 132 mg/dL (ref 0–149)
VLDL Cholesterol Cal: 24 mg/dL (ref 5–40)

## 2021-01-12 LAB — CBC WITH DIFFERENTIAL/PLATELET
Basophils Absolute: 0.1 10*3/uL (ref 0.0–0.2)
Basos: 1 %
EOS (ABSOLUTE): 0.1 10*3/uL (ref 0.0–0.4)
Eos: 2 %
Hematocrit: 41.2 % (ref 37.5–51.0)
Hemoglobin: 14.1 g/dL (ref 13.0–17.7)
Immature Grans (Abs): 0 10*3/uL (ref 0.0–0.1)
Immature Granulocytes: 0 %
Lymphocytes Absolute: 2.5 10*3/uL (ref 0.7–3.1)
Lymphs: 47 %
MCH: 26.7 pg (ref 26.6–33.0)
MCHC: 34.2 g/dL (ref 31.5–35.7)
MCV: 78 fL — ABNORMAL LOW (ref 79–97)
Monocytes Absolute: 0.5 10*3/uL (ref 0.1–0.9)
Monocytes: 9 %
Neutrophils Absolute: 2.2 10*3/uL (ref 1.4–7.0)
Neutrophils: 41 %
Platelets: 296 10*3/uL (ref 150–450)
RBC: 5.28 x10E6/uL (ref 4.14–5.80)
RDW: 13 % (ref 11.6–15.4)
WBC: 5.2 10*3/uL (ref 3.4–10.8)

## 2021-01-12 LAB — COMPREHENSIVE METABOLIC PANEL
ALT: 11 IU/L (ref 0–44)
AST: 13 IU/L (ref 0–40)
Albumin/Globulin Ratio: 1.8 (ref 1.2–2.2)
Albumin: 4.9 g/dL (ref 4.0–5.0)
Alkaline Phosphatase: 92 IU/L (ref 44–121)
BUN/Creatinine Ratio: 9 (ref 9–20)
BUN: 11 mg/dL (ref 6–24)
Bilirubin Total: 0.4 mg/dL (ref 0.0–1.2)
CO2: 25 mmol/L (ref 20–29)
Calcium: 10.2 mg/dL (ref 8.7–10.2)
Chloride: 101 mmol/L (ref 96–106)
Creatinine, Ser: 1.17 mg/dL (ref 0.76–1.27)
Globulin, Total: 2.7 g/dL (ref 1.5–4.5)
Glucose: 79 mg/dL (ref 70–99)
Potassium: 4.5 mmol/L (ref 3.5–5.2)
Sodium: 139 mmol/L (ref 134–144)
Total Protein: 7.6 g/dL (ref 6.0–8.5)
eGFR: 77 mL/min/{1.73_m2} (ref >59)

## 2021-01-13 ENCOUNTER — Encounter: Payer: Self-pay | Admitting: Clinical

## 2021-01-13 NOTE — Progress Notes (Signed)
Integrated Behavioral Health General Follow Up Note  01/13/2021 Name: Bary Limbach MRN: [id number] DOB: Mar 02, 1970 Corban Kistler is a 51 y.o. year old male who sees Patient, No Pcp Per (Inactive) for primary care. LCSW was initially consulted to assess patient needs and assist with community resources.  Interpreter: No.   Interpreter Name & Language: none  Assessment: Patient experiencing homelessness and financial difficulties related to no income.  Ongoing Intervention: Today patient called CSW with questions about the [facility]). He had gone there and indicated they provided him with information on where to park his [name], as he is currently living in his vehicle. Patient is interested in applying for social security disability.   Patient returned to the Patient Care Center [facility]) briefly today. Reviewed [facility] referral for disability claim assistance and patient signed referral; CSW to submit. Also referred patient to [facility] Provided patient with some grocery items from [facility] food pantry.   Review of patient status, including review of consultants reports, relevant laboratory and other test results, and collaboration with appropriate care team members and the patient's provider was performed as part of comprehensive patient evaluation and provision of services.    Abigail Butts, LCSW Patient Care Center [facility] [phone]

## 2021-01-18 ENCOUNTER — Encounter: Payer: Self-pay | Admitting: Clinical

## 2021-01-18 ENCOUNTER — Ambulatory Visit: Payer: Self-pay | Admitting: Cardiology

## 2021-01-18 NOTE — Progress Notes (Signed)
Integrated Behavioral Health General Follow Up Note  01/18/2021 Name: Steven Higgins MRN: [id number] DOB: May 13, 1970 Steven Higgins is a 51 y.o. year old male who sees Passmore, Enid Derry I, NP for primary care. LCSW was initially consulted to assess patient needs and assist with community resources.  Interpreter: No.   Interpreter Name & Language: none  Assessment: Patient experiencing homelessness and financial difficulties related to no income.  Ongoing Intervention: Received call this week from [facility] that they had made contact with patient.  Patient came by the Patient Care Center [facility]) today to review documents he has collected so far for [facility] application. He obtained remaining documents this afternoon and called CSW. Scheduled patient for fianncial counselor at [facility]) on 01/30/21 to submit [employer] and [employer] applications.  Review of patient status, including review of consultants reports, relevant laboratory and other test results, and collaboration with appropriate care team members and the patient's provider was performed as part of comprehensive patient evaluation and provision of services.    Abigail Butts, LCSW Patient [facility] Center [facility] [phone]

## 2021-01-20 ENCOUNTER — Telehealth: Payer: Self-pay | Admitting: Clinical

## 2021-01-20 NOTE — Telephone Encounter (Signed)
Integrated Behavioral Health General Follow Up Note  01/20/2021 Name: Juanluis Guastella MRN: [id number] DOB: [birth year] Taeshaun Rames is a 51 y.o. year old male who sees Passmore, Enid Derry I, NP for primary care. LCSW was initially consulted to assess patient needs and assist with community resources.  Interpreter: No.   Interpreter Name & Language: none  Assessment: Patient experiencing homelessness and financial difficulties related to no income.  Ongoing Intervention: Called patient and advised him of emergency winter shelter open tonight at the [facility]) Patient has connected with [facility] and completed intake paperwork with them.  Review of patient status, including review of consultants reports, relevant laboratory and other test results, and collaboration with appropriate care team members and the patient's provider was performed as part of comprehensive patient evaluation and provision of services.    Abigail Butts, LCSW Patient Care Center [facility] [phone]

## 2021-01-24 ENCOUNTER — Telehealth: Payer: Self-pay

## 2021-01-24 NOTE — Telephone Encounter (Signed)
Pt walk asking about being referral to [facility]

## 2021-01-25 ENCOUNTER — Other Ambulatory Visit: Payer: Self-pay | Admitting: Nurse Practitioner

## 2021-01-25 DIAGNOSIS — R569 Unspecified convulsions: Secondary | ICD-10-CM

## 2021-01-30 ENCOUNTER — Ambulatory Visit: Payer: Self-pay | Attending: Nurse Practitioner

## 2021-01-30 ENCOUNTER — Other Ambulatory Visit: Payer: Self-pay

## 2021-02-01 ENCOUNTER — Telehealth: Payer: Self-pay

## 2021-02-01 NOTE — Telephone Encounter (Signed)
We received an urgent referral for this patient for seizures, LS by Dr Leonie Man in 2018. I spoke with the patient today and advised that Dr Lenell Antu next available is going to be in May. He advised that he is currently homeless and living in his Lucianne Lei, and not able to work due to his seizures.  There is currently no sooner appointment available for this patient, can you please advise when he can be worked in.  He is wanting clearance to drive and go back to work so that he can get himself back on his feet.

## 2021-02-07 ENCOUNTER — Encounter (HOSPITAL_COMMUNITY): Payer: Self-pay | Admitting: Emergency Medicine

## 2021-02-07 ENCOUNTER — Emergency Department (HOSPITAL_COMMUNITY): Payer: Self-pay

## 2021-02-07 ENCOUNTER — Ambulatory Visit: Payer: Self-pay | Admitting: Neurology

## 2021-02-07 ENCOUNTER — Emergency Department (HOSPITAL_COMMUNITY)
Admission: EM | Admit: 2021-02-07 | Discharge: 2021-02-08 | Disposition: A | Discharge status: Home / Self Care | Payer: Self-pay | Attending: Emergency Medicine | Admitting: Emergency Medicine

## 2021-02-07 DIAGNOSIS — I10 Essential (primary) hypertension: Secondary | ICD-10-CM | POA: Insufficient documentation

## 2021-02-07 DIAGNOSIS — R930 Abnormal findings on diagnostic imaging of skull and head, not elsewhere classified: Secondary | ICD-10-CM

## 2021-02-07 DIAGNOSIS — Z79899 Other long term (current) drug therapy: Secondary | ICD-10-CM | POA: Insufficient documentation

## 2021-02-07 DIAGNOSIS — R202 Paresthesia of skin: Secondary | ICD-10-CM | POA: Insufficient documentation

## 2021-02-07 LAB — BASIC METABOLIC PANEL
Anion gap: 9 (ref 5–15)
BUN: 11 mg/dL (ref 6–20)
CO2: 25 mmol/L (ref 22–32)
Calcium: 9.6 mg/dL (ref 8.9–10.3)
Chloride: 103 mmol/L (ref 98–111)
Creatinine, Ser: 1.45 mg/dL — ABNORMAL HIGH (ref 0.61–1.24)
GFR, Estimated: 59 mL/min — ABNORMAL LOW (ref >60)
Glucose, Bld: 126 mg/dL — ABNORMAL HIGH (ref 70–99)
Potassium: 4.1 mmol/L (ref 3.5–5.1)
Sodium: 137 mmol/L (ref 135–145)

## 2021-02-07 LAB — RAPID URINE DRUG SCREEN, HOSP PERFORMED
Amphetamines: NOT DETECTED
Barbiturates: NOT DETECTED
Benzodiazepines: NOT DETECTED
Cocaine: NOT DETECTED
Opiates: NOT DETECTED
Tetrahydrocannabinol: NOT DETECTED

## 2021-02-07 LAB — URINALYSIS, ROUTINE W REFLEX MICROSCOPIC
Bilirubin Urine: NEGATIVE
Glucose, UA: NEGATIVE mg/dL
Hgb urine dipstick: NEGATIVE
Ketones, ur: NEGATIVE mg/dL
Leukocytes,Ua: NEGATIVE
Nitrite: NEGATIVE
Protein, ur: NEGATIVE mg/dL
Specific Gravity, Urine: 1.006 (ref 1.005–1.030)
pH: 5 (ref 5.0–8.0)

## 2021-02-07 LAB — CBC
HCT: 44.3 % (ref 39.0–52.0)
Hemoglobin: 13.9 g/dL (ref 13.0–17.0)
MCH: 26.1 pg (ref 26.0–34.0)
MCHC: 31.4 g/dL (ref 30.0–36.0)
MCV: 83.1 fL (ref 80.0–100.0)
Platelets: 297 10*3/uL (ref 150–400)
RBC: 5.33 MIL/uL (ref 4.22–5.81)
RDW: 14.1 % (ref 11.5–15.5)
WBC: 6.5 10*3/uL (ref 4.0–10.5)
nRBC: 0 % (ref 0.0–0.2)

## 2021-02-07 LAB — ETHANOL: Alcohol, Ethyl (B): <10 mg/dL (ref <10)

## 2021-02-07 NOTE — ED Notes (Signed)
Patient had a 18 G IV in the L AC that was not documented.  I did remove the IV because the patient stated he was going outside to take a walk.  I advise the patient that he should not leave that property as he is still in process.  Patient advised that he was just going outside cause he was tired of sitting.

## 2021-02-07 NOTE — ED Triage Notes (Signed)
Patient [place] [facility] [place] park for left sided numbness that started at 1345 today. No facial droop, strong radial pulse, no arm drift, patient alert, oriented, and in no apparent distress at this time. 18g saline lock in right AC.  HR 130 BP 130/palp RR 16 98% on room air

## 2021-02-07 NOTE — ED Provider Triage Note (Signed)
Emergency Medicine Provider Triage Evaluation Note  Steven Higgins , a 51 y.o. male  was evaluated in triage.  Pt complains of tingling sensation and slurred speech.  At approximately 1345 today patient had tingling sensation to first 3 digits of left hand and left leg from his knee down.  Patient states that he was talking to social worker on the phone and she noted slurred speech.  Patient reports that tingling sensation to leg and slurred speech lasted until EMS arrived.  Patient reports that he is still having tingling sensation to first digit of left hand.  Denies any EtOH, illicit drug use, or recent fall/traumatic injuries.  Review of Systems  Positive: Paresthesia, slurred speech Negative: Visual disturbance, facial asymmetry, numbness, weakness  Physical Exam  BP 104/62 (BP Location: Right Arm)    Pulse (!) 131    Temp 98.3 F (36.8 C) (Oral)    Resp 16    SpO2 98%  Gen:   Awake, no distress   Resp:  Normal effort  MSK:   Moves extremities without difficulty  Other:  CN II through XII intact.  Sensation grossly intact to bilateral upper and lower extremities.  Finger-to-nose normal.  Pupils PERRL, EOM intact bilaterally.  Medical Decision Making  Medically screening exam initiated at 3:07 PM.  Appropriate orders placed.  Steven Higgins was informed that the remainder of the evaluation will be completed by another provider, this initial triage assessment does not replace that evaluation, and the importance of remaining in the ED until their evaluation is complete.  Patient care was discussed with attending physician Dr. Eulis Foster.   Loni Beckwith, [place] 02/07/21 [phone]

## 2021-02-08 ENCOUNTER — Other Ambulatory Visit: Payer: Self-pay

## 2021-02-08 ENCOUNTER — Other Ambulatory Visit (HOSPITAL_COMMUNITY): Payer: Self-pay

## 2021-02-08 ENCOUNTER — Other Ambulatory Visit: Payer: Self-pay | Admitting: Nurse Practitioner

## 2021-02-08 ENCOUNTER — Telehealth: Payer: Self-pay

## 2021-02-08 DIAGNOSIS — E785 Hyperlipidemia, unspecified: Secondary | ICD-10-CM

## 2021-02-08 LAB — LEVETIRACETAM LEVEL: Levetiracetam Lvl: 16.1 ug/mL (ref 10.0–40.0)

## 2021-02-08 MED ORDER — ISOSORBIDE MONONITRATE ER 30 MG PO TB24
60.0000 mg | ORAL_TABLET | Freq: Every day | ORAL | Status: DC
  Administered 2021-02-08: 60 mg via ORAL
  Filled 2021-02-08: qty 2

## 2021-02-08 MED ORDER — HYDRALAZINE HCL 25 MG PO TABS
37.5000 mg | ORAL_TABLET | Freq: Three times a day (TID) | ORAL | Status: DC
  Administered 2021-02-08: 37.5 mg via ORAL
  Filled 2021-02-08: qty 2

## 2021-02-08 MED ORDER — CLINDAMYCIN HCL 150 MG PO CAPS
150.0000 mg | ORAL_CAPSULE | Freq: Four times a day (QID) | ORAL | 0 refills | Status: DC
  Filled 2021-02-08 (×2): qty 28, 7d supply, fill #0

## 2021-02-08 MED ORDER — CLINDAMYCIN HCL 150 MG PO CAPS
300.0000 mg | ORAL_CAPSULE | Freq: Once | ORAL | Status: AC
  Administered 2021-02-08: 300 mg via ORAL
  Filled 2021-02-08: qty 2

## 2021-02-08 MED ORDER — ATORVASTATIN CALCIUM 20 MG PO TABS
20.0000 mg | ORAL_TABLET | Freq: Every evening | ORAL | 0 refills | Status: DC
  Filled 2021-02-08 – 2021-02-16 (×2): qty 30, 30d supply, fill #0

## 2021-02-08 MED ORDER — ATORVASTATIN CALCIUM 10 MG PO TABS
20.0000 mg | ORAL_TABLET | Freq: Every evening | ORAL | Status: DC
  Administered 2021-02-08: 20 mg via ORAL
  Filled 2021-02-08: qty 2

## 2021-02-08 MED ORDER — CARVEDILOL 3.125 MG PO TABS
6.2500 mg | ORAL_TABLET | Freq: Two times a day (BID) | ORAL | Status: DC
  Administered 2021-02-08: 6.25 mg via ORAL
  Filled 2021-02-08: qty 2

## 2021-02-08 MED ORDER — LEVETIRACETAM 500 MG PO TABS
500.0000 mg | ORAL_TABLET | Freq: Two times a day (BID) | ORAL | Status: DC
  Administered 2021-02-08: 500 mg via ORAL
  Filled 2021-02-08: qty 1

## 2021-02-08 NOTE — ED Provider Notes (Signed)
[facility] EMERGENCY DEPARTMENT Provider Note   CSN: [id number] Arrival date & time: 02/07/21  1442     History  No chief complaint on file.   Steven Higgins is a 51 y.o. male.  51 year old male the presents to the emerged part today for some tingling in his fingers.  Patient states that earlier today he had some paresthesias in his first 3 digits of his left hand.  He told triage that he also had a in his left knee but denies that to me.  Patient states that this is since resolved.  He had no weakness there.  No headaches.  No vision changes.  At this time he is just hungry and worried about his blood pressure being so high.  He states that he has had history of similar symptoms in the past with his blood pressure being high.  States he is homeless and has difficulty eating sometimes but always takes his medications as scheduled.  Review of the records it appears that he is seeing neurology for breakthrough seizures as well.       Home Medications Prior to Admission medications   Medication Sig Start Date End Date Taking? Authorizing Provider  carvedilol (COREG) 6.25 MG tablet Take 1 tablet (6.25 mg total) by mouth 2 (two) times daily with a meal. 01/11/21 04/11/21 Yes Passmore, Enid Derry I, NP  hydrALAZINE (APRESOLINE) 25 MG tablet Take 1.5 tablets (37.5 mg total) by mouth 3 (three) times daily. Patient taking differently: Take 25 mg by mouth 3 (three) times daily. 01/11/21 07/10/21 Yes Passmore, Enid Derry I, NP  isosorbide mononitrate (IMDUR) 60 MG 24 hr tablet Take 1 tablet (60 mg total) by mouth daily. 01/11/21 07/10/21 Yes Passmore, Enid Derry I, NP  levETIRAcetam (KEPPRA) 500 MG tablet Take 1 tablet (500 mg total) by mouth 2 (two) times daily. 01/11/21 04/11/21 Yes Passmore, Enid Derry I, NP  metFORMIN (GLUCOPHAGE) 500 MG tablet Take 1 tablet (500 mg total) by mouth 2 (two) times daily with a meal. 01/11/21  Yes Passmore, Enid Derry I, NP  atorvastatin (LIPITOR) 20 MG tablet Take 1 tablet  (20 mg total) by mouth every evening. 01/11/21   Orion Crook I, NP      Allergies    Lisinopril    Review of Systems   Review of Systems  Physical Exam Updated Vital Signs BP (!) 176/100 (BP Location: Left Arm)    Pulse 76    Temp 98.3 F (36.8 C) (Oral)    Resp 18    SpO2 98%  Physical Exam Vitals and nursing note reviewed.  Constitutional:      Appearance: He is well-developed.  HENT:     Head: Normocephalic and atraumatic.     Mouth/Throat:     Mouth: Mucous membranes are moist.     Pharynx: Oropharynx is clear.  Eyes:     Pupils: Pupils are equal, round, and reactive to light.  Cardiovascular:     Rate and Rhythm: Normal rate.  Pulmonary:     Effort: Pulmonary effort is normal. No respiratory distress.  Abdominal:     General: Abdomen is flat. There is no distension.  Musculoskeletal:        General: No swelling. Normal range of motion.     Cervical back: Normal range of motion.  Skin:    General: Skin is warm and dry.  Neurological:     General: No focal deficit present.     Mental Status: He is alert.     Comments: Ambulates  Grip strength equal Sensation intact No facial droop MS normal LE symmetric dorsiflexion/plantarflexion and sensation to light touch    ED Results / Procedures / Treatments   Labs (all labs ordered are listed, but only abnormal results are displayed) Labs Reviewed  BASIC METABOLIC PANEL - Abnormal; Notable for the following components:      Result Value   Glucose, Bld 126 (*)    Creatinine, Ser 1.45 (*)    GFR, Estimated 59 (*)    All other components within normal limits  CBC  URINALYSIS, ROUTINE W REFLEX MICROSCOPIC  ETHANOL  RAPID URINE DRUG SCREEN, HOSP PERFORMED  LEVETIRACETAM LEVEL  CBG MONITORING, ED    EKG None  Radiology CT HEAD WO CONTRAST ( )  Result Date: 02/07/2021 CLINICAL DATA:  Provided history: Neuro deficit, acute, stroke suspected. Additional history provided: Patient reports tingling sensation  and slurred speech. Tingling sensation in first 3 digits of left hand and in left leg from knee down. EXAM: CT HEAD WITHOUT CONTRAST TECHNIQUE: Contiguous axial images were obtained from the base of the skull through the vertex without intravenous contrast. RADIATION DOSE REDUCTION: This exam was performed according to the departmental dose-optimization program which includes automated exposure control, adjustment of the mA and/or kV according to patient size and/or use of iterative reconstruction technique. COMPARISON:  Brain MRI 11/19/2020.  Head CT 11/19/2020. FINDINGS: Brain: Redemonstrated moderate-sized chronic cortical/subcortical infarct with associated dystrophic calcification in the mid right frontal lobe/right frontal operculum and right insula. Background patchy and ill-defined hypoattenuation within the cerebral white matter, nonspecific but likely reflecting chronic small vessel ischemic disease given the patient's history of diabetes, hyperlipidemia and hypertension. Redemonstrated chronic lacunar infarct within the left lentiform nucleus. There is no acute intracranial hemorrhage. No acute demarcated cortical infarct. No extra-axial fluid collection. No evidence of an intracranial mass. No midline shift. Vascular: No hyperdense vessel. Atherosclerotic calcifications. Skull: Normal. Negative for fracture or focal lesion. Sinuses/Orbits: Visualized orbits show no acute finding. Extensive partial opacification of the left maxillary sinus at the imaged levels. There is contiguous opacity extending into the left aspect of the nasal passage (series 4, image 1). 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 Trace mucosal thickening within the right maxillary sinus at the imaged levels. Mild mucosal thickening and small mucous retention cyst within a posterior right ethmoid air cell. Mild-to-moderate mucosal thickening within the left frontoethmoidal recess. Other: Subcentimeter left frontal  scalp lipoma. IMPRESSION: No evidence of acute intracranial abnormality. Redemonstrated moderate-sized chronic cortical/subcortical infarct within the mid right frontal lobe/right frontal operculum and right insula (right MCA vascular territory). Background patchy and ill-defined hypoattenuation within the cerebral white matter, nonspecific but likely reflecting age-advanced chronic small vessel ischemic disease. Unchanged chronic lacunar infarct within the left basal ganglia. Paranasal sinus disease at the imaged levels, as described. Outpatient ENT consultation should be considered. Electronically Signed   By: Jackey Loge D.O.   On: 02/07/2021 15:39    Procedures Procedures    Medications Ordered in ED Medications  carvedilol (COREG) tablet 6.25 mg (has no administration in time range)  hydrALAZINE (APRESOLINE) tablet 37.5 mg (has no administration in time range)  isosorbide mononitrate (IMDUR) 24 hr tablet 60 mg (has no administration in time range)  atorvastatin (LIPITOR) tablet 20 mg (has no administration in time range)  levETIRAcetam (KEPPRA) tablet 500 mg (has no administration in time range)    ED Course/ Medical Decision Making/ A&P  Medical Decision Making Amount and/or Complexity of Data Reviewed Labs: ordered.  Risk Prescription drug management.   Seems like a peripheral nerve paresthesia to be possibly carpal tunnel.  Symptoms have been resolved for multiple hours has been way here.  CT scan does not show anything acute as far as the brain goes.  He does have likely possible osteitis related to sinus effusions.  Will start on antibiotics refer to ENT however he is homeless so not sure how well that will work I will put in a consult for TOC.  Blood pressures improved as did his heart rate with his home medications.  He will continue taking his home medications today.  Antibiotics given for sinus infection and ENT follow-up suggested   Final  Clinical Impression(s) / ED Diagnoses Final diagnoses:  None    Rx / DC Orders ED Discharge Orders     None         Leilan Bochenek, Barbara Cower, MD 02/09/21 [phone]

## 2021-02-08 NOTE — Telephone Encounter (Signed)
Atorvaststin Carvedilol Hydralazine Isorbide monoitrate Levetiracetam Metformin   Pt came in to office requesting these refills

## 2021-02-10 ENCOUNTER — Ambulatory Visit (INDEPENDENT_AMBULATORY_CARE_PROVIDER_SITE_OTHER): Payer: Self-pay | Admitting: Nurse Practitioner

## 2021-02-10 ENCOUNTER — Other Ambulatory Visit: Payer: Self-pay

## 2021-02-10 ENCOUNTER — Encounter: Payer: Self-pay | Admitting: Nurse Practitioner

## 2021-02-10 VITALS — BP 140/87 | HR 85 | Temp 98.1°F | Ht 78.0 in | Wt 215.0 lb

## 2021-02-10 DIAGNOSIS — R569 Unspecified convulsions: Secondary | ICD-10-CM

## 2021-02-10 DIAGNOSIS — Z5900 Homelessness unspecified: Secondary | ICD-10-CM

## 2021-02-10 DIAGNOSIS — I10 Essential (primary) hypertension: Secondary | ICD-10-CM

## 2021-02-10 NOTE — [place] Notes (Signed)
[facility] General Follow Up Note  02/10/2021 Name: Steven Higgins MRN: [id number] DOB: 1970-08-25 Steven Higgins is a 51 y.o. year old male who sees Passmore, Enid Derry I, NP for primary care. LCSW was initially consulted to assess patient needs and assist with community resources.  Interpreter: No.   Interpreter Name & Language: none  Assessment: Patient experiencing homelessness and financial difficulties related to no income.  Ongoing Intervention: Patient continues to utilize the [employer] [facility]) and is on housing waiting list. He would like to return to work. Provided food from [facility]) food pantry.  Review of patient status, including review of consultants reports, relevant laboratory and other test results, and collaboration with appropriate care team members and the patient's provider was performed as part of comprehensive patient evaluation and provision of services.    Abigail Butts, LCSW [facility] [phone]

## 2021-02-10 NOTE — Patient Instructions (Signed)
You were seen today in the [facility] for hospital follow up . You were prescribed medications, please take as directed. Please follow up in 6 mths for reevaluation of chronic illness.

## 2021-02-10 NOTE — [place] Notes (Signed)
[address] [place], [place]  [address] Phone:  [phone]   Fax:  [phone] Subjective:   Patient ID: Steven Higgins, male    DOB: Mar 13, 1970, 51 y.o.   MRN: [id number]  Chief Complaint  Patient presents with   Follow-up    Pt is here for follow visit from the hospital   HPI Steven Higgins 51 y.o. male  has a past medical history of CAD (coronary artery disease), native coronary artery (01/22/2017), Carotid artery stenosis (04/04/2016), Diabetes mellitus ([place]), Ectatic abdominal aorta ([place]), Hyperlipidemia LDL goal <70, Hypertensive heart disease with CHF ([place]), Intracerebral bleed ([place]), NICM (nonischemic cardiomyopathy) ([place]), Primary hyperaldosteronism ([place]), Renal artery stenosis ([place]) (04/04/2016), and Seizure disorder ([place]). To the [facility] for hospital follow up.  States that he went to the ED for numbness and tingling in the BUE and BLE and tongue heaviness. Was evaluated and discharged with antibiotics for sinusitis. States that symptoms have subsided since being discharged form the ED. Denies any other complaints today. Suspects numbness and tingling related to positioning when sleeping in his van. Has been compliant with all medications. Has been checking blood pressure regularly. Patient states that he is working on getting housing, sleeps at the shelter when available. Has upcoming follow up with Neurology in May, but requesting medical clearance to drive and return to work.  Denies any fever. Denies any fatigue, chest pain, shortness of breath, HA or dizziness. Denies any blurred vision, numbness or tingling.   Past Medical History:  Diagnosis Date   CAD (coronary artery disease), native coronary artery 01/22/2017   coronary Ca score of 143 and mild nonobstructive plaque in the RCA that is tortuous with aneurysmal dilatation and minimal plaque in the LAD and LCx by coronary CTA 07/2016   Carotid artery stenosis 04/04/2016   1-39% bilateral by  dopplers 03/2016   Diabetes mellitus ([place])    Ectatic abdominal aorta ([place])    a. f/u due 2024.   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 EF 40-45% by echo 03/2016 presumed due to HTN - nuc with no ischemia, nonobstructive CAD by cor CT 07/2016.   Primary hyperaldosteronism ([place])    Renal artery stenosis ([place]) 04/04/2016   By renal dopplers 03/2016 1-59% but not seen on CT abd 02/2017   Seizure disorder ([place])    as sequela of small intracerebral bleed    History reviewed. No pertinent surgical history.  Family History  Problem Relation Age of Onset   Diabetes Mother    Heart disease Mother    Hypertension Mother    Heart disease Father    Heart attack Father    Hypertension Father    Hypertension Maternal Grandmother    Diabetes Maternal Grandmother    Hypertension Paternal Grandmother     Social History   Socioeconomic History   Marital status: Single    Spouse name: Not on file   Number of children: Not on file   Years of education: Not on file   Highest education level: Not on file  Occupational History   Not on file  Tobacco Use   Smoking status: Every Day    Packs/day: 0.25    Types: Cigarettes   Smokeless tobacco: Never   Tobacco comments:    07/30/16 smoke 3-4 cigarettes per day-not wearing  patch  Substance and Sexual Activity   Alcohol use: No    Alcohol/week: 1.0 standard drink    Types: 1 Cans of beer per week   Drug use: No   Sexual activity: Not on file  Other Topics Concern   Not on file  Social History Narrative   Not on file   Social Determinants of Health   Financial Resource Strain: High Risk   Difficulty of Paying Living Expenses: Very hard  Food Insecurity: Food Insecurity Present   Worried About Charity fundraiser in the Last Year: Often true   Arboriculturist in the Last Year: Often true  Transportation  Needs: Not on file  Physical Activity: Not on file  Stress: Not on file  Social Connections: Not on file  Intimate Partner Violence: Not on file    Outpatient Medications Prior to Visit  Medication Sig Dispense Refill   atorvastatin (LIPITOR) 20 MG tablet Take 1 tablet (20 mg total) by mouth every evening. 30 tablet 0   carvedilol (COREG) 6.25 MG tablet Take 1 tablet (6.25 mg total) by mouth 2 (two) times daily with a meal. 60 tablet 2   clindamycin (CLEOCIN) 150 MG capsule Take 1 capsule (150 mg total) by mouth every 6 (six) hours. 28 capsule 0   hydrALAZINE (APRESOLINE) 25 MG tablet Take 1.5 tablets (37.5 mg total) by mouth 3 (three) times daily. (Patient taking differently: Take 25 mg by mouth 3 (three) times daily.) 135 tablet 5   isosorbide mononitrate (IMDUR) 60 MG 24 hr tablet Take 1 tablet (60 mg total) by mouth daily. 30 tablet 5   levETIRAcetam (KEPPRA) 500 MG tablet Take 1 tablet (500 mg total) by mouth 2 (two) times daily. 60 tablet 2   metFORMIN (GLUCOPHAGE) 500 MG tablet Take 1 tablet (500 mg total) by mouth 2 (two) times daily with a meal. 180 tablet 3   No facility-administered medications prior to visit.    Allergies  Allergen Reactions   Lisinopril Cough    Review of Systems  Constitutional:  Negative for chills, fever and malaise/fatigue.  Respiratory:  Negative for cough and shortness of breath.   Cardiovascular:  Negative for chest pain, palpitations and leg swelling.  Gastrointestinal:  Negative for abdominal pain, blood in stool, constipation, diarrhea, nausea and vomiting.  Skin: Negative.   Neurological:  Positive for tingling and sensory change. Negative for dizziness, tremors, speech change, focal weakness, seizures, loss of consciousness, weakness and headaches.  Psychiatric/Behavioral:  Negative for depression. The patient is not nervous/anxious.   All other systems reviewed and are negative.     Objective:    Physical Exam Vitals reviewed.   Constitutional:      General: He is not in acute distress.    Appearance: Normal appearance. He is normal weight.  HENT:     Head: Normocephalic.  Cardiovascular:     Rate and Rhythm: Normal rate and regular rhythm.     Pulses: Normal pulses.     Heart sounds: Normal heart sounds.     Comments: No obvious peripheral edema Pulmonary:     Effort: Pulmonary effort is normal.     Breath sounds: Normal breath sounds.  Musculoskeletal:        General: No swelling, tenderness, deformity or signs of injury. Normal range of motion.     Right lower leg: No edema.     Left lower leg: No edema.  Skin:    General: Skin is warm and dry.  Capillary Refill: Capillary refill takes less than 2 seconds.  Neurological:     General: No focal deficit present.     Mental Status: He is alert and oriented to person, place, and time.  Psychiatric:        Mood and Affect: Mood normal.        Behavior: Behavior normal.        Thought Content: Thought content normal.        Judgment: Judgment normal.    BP 140/87    Pulse 85    Temp 98.1 F (36.7 C)    Ht '6\' 6"'  (1.981 m)    Wt 215 lb 0.4 oz (97.5 kg)    SpO2 98%    BMI 24.85 kg/m  Wt Readings from Last 3 Encounters:  02/10/21 215 lb 0.4 oz (97.5 kg)  01/11/21 215 lb (97.5 kg)  11/19/20 209 lb 10.5 oz (95.1 kg)    Immunization History  Administered Date(s) Administered   Tdap 11/19/2020    Diabetic Foot Exam - Simple   No data filed     Lab Results  Component Value Date   TSH 1.312 11/19/2020   Lab Results  Component Value Date   WBC 6.5 02/07/2021   HGB 13.9 02/07/2021   HCT 44.3 02/07/2021   MCV 83.1 02/07/2021   PLT 297 02/07/2021   Lab Results  Component Value Date   NA 137 02/07/2021   K 4.1 02/07/2021   CO2 25 02/07/2021   GLUCOSE 126 (H) 02/07/2021   BUN 11 02/07/2021   CREATININE 1.45 (H) 02/07/2021   BILITOT 0.4 01/11/2021   ALKPHOS 92 01/11/2021   AST 13 01/11/2021   ALT 11 01/11/2021   PROT 7.6 01/11/2021    ALBUMIN 4.9 01/11/2021   CALCIUM 9.6 02/07/2021   ANIONGAP 9 02/07/2021   EGFR 77 01/11/2021   GFR 65.96 01/11/2020   Lab Results  Component Value Date   CHOL 226 (H) 01/11/2021   CHOL 135 09/27/2016   CHOL 217 (H) 03/16/2016   Lab Results  Component Value Date   HDL 54 01/11/2021   HDL 50 09/27/2016   HDL 50 03/16/2016   Lab Results  Component Value Date   LDLCALC 148 (H) 01/11/2021   LDLCALC 68 09/27/2016   LDLCALC 151 (H) 03/16/2016   Lab Results  Component Value Date   TRIG 132 01/11/2021   TRIG 87 09/27/2016   TRIG 79 03/16/2016   Lab Results  Component Value Date   CHOLHDL 4.2 01/11/2021   CHOLHDL 2.7 09/27/2016   CHOLHDL 4.3 03/16/2016   Lab Results  Component Value Date   HGBA1C 6.4 01/11/2021   HGBA1C 6.6 (H) 01/11/2020   HGBA1C 6.5 08/11/2018       Assessment & Plan:   Problem List Items Addressed This Visit       Cardiovascular and Mediastinum   Hypertension - Primary Encouraged continued compliance to medication Encouraged continued home monitoring of B/P    Other Visit Diagnoses     Homelessness     Provided with food prior to end of visit  Endorsed clinics continued support as a resource    Seizures [facility])     Discussed with patient current recommendations for patients with seizure disorder and driving. Informed that, although he may return to work, he can not be medically cleared to drive due to his significant medical history. Patient demonstrated understanding.   Follow up in 6 mths for reevaluation of chronic illness, sooner as needed  I am having Rui Anand maintain his carvedilol, hydrALAZINE, isosorbide mononitrate, levETIRAcetam, metFORMIN, clindamycin, and atorvastatin.  No orders of the defined types were placed in this encounter.    Teena Dunk, NP

## 2021-02-15 ENCOUNTER — Other Ambulatory Visit: Payer: Self-pay

## 2021-02-16 ENCOUNTER — Other Ambulatory Visit: Payer: Self-pay

## 2021-02-17 NOTE — Telephone Encounter (Signed)
Pt has been scheduled.  °

## 2021-02-23 ENCOUNTER — Encounter: Payer: Self-pay | Admitting: Neurology

## 2021-02-23 ENCOUNTER — Ambulatory Visit (INDEPENDENT_AMBULATORY_CARE_PROVIDER_SITE_OTHER): Payer: Self-pay | Admitting: Neurology

## 2021-02-23 ENCOUNTER — Other Ambulatory Visit (HOSPITAL_COMMUNITY): Payer: Self-pay

## 2021-02-23 VITALS — BP 144/78 | HR 77 | Ht 78.0 in | Wt 212.0 lb

## 2021-02-23 DIAGNOSIS — G40209 Localization-related (focal) (partial) symptomatic epilepsy and epileptic syndromes with complex partial seizures, not intractable, without status epilepticus: Secondary | ICD-10-CM

## 2021-02-23 DIAGNOSIS — I611 Nontraumatic intracerebral hemorrhage in hemisphere, cortical: Secondary | ICD-10-CM

## 2021-02-23 DIAGNOSIS — G40909 Epilepsy, unspecified, not intractable, without status epilepticus: Secondary | ICD-10-CM

## 2021-02-23 MED ORDER — LEVETIRACETAM 500 MG PO TABS
500.0000 mg | ORAL_TABLET | Freq: Two times a day (BID) | ORAL | 5 refills | Status: DC
  Filled 2021-02-23 – 2021-03-06 (×2): qty 60, 30d supply, fill #0
  Filled 2021-04-19: qty 60, 30d supply, fill #1
  Filled 2021-04-20: qty 60, 30d supply, fill #0
  Filled 2021-05-18: qty 60, 30d supply, fill #1
  Filled 2021-06-19: qty 60, 30d supply, fill #2

## 2021-02-23 NOTE — Patient Instructions (Signed)
I had a long discussion with the patient regarding his recent breakthrough seizures related to medication noncompliance and stressed the need to not miss any dosages and asked to call for help if he cannot afford his medications and he expressed understanding.  Continue Keppra at the current dosage of 500 mg twice daily and he was given a refill.  Continue to maintain strict control of hypertension with blood pressure goal below 140/90.  Avoid seizure provoking stimuli like medication noncompliance, sleep deprivation, extremes of activity and exposure to bright lights.  He was advised not to drive for 6 months since his last seizure as per [facility].  He will return for follow-up in the future in 6 months with my nurse practitioner call earlier if necessary. Seizure, Adult A seizure is a sudden burst of abnormal electrical and chemical activity in the brain. Seizures usually last from 30 seconds to 2 minutes. The abnormal activity temporarily interrupts normal brain function. Many types of seizures can affect adults. A seizure can cause many different symptoms depending on where in the brain it starts. What are the causes? Common causes of this condition include: Fever or infection. Brain injury, head trauma, bleeding in the brain, or a brain tumor. Low levels of blood sugar or salt (sodium). Kidney problems or liver problems. Metabolic disorders or other conditions that are passed from parent to child (are inherited). Reaction to a substance, such as a drug or a medicine, or suddenly stopping the use of a substance (withdrawal). A stroke. Developmental disorders such as autism spectrum disorder or cerebral palsy. In some cases, the cause of a seizure may not be known. Some people who have a seizure never have another one. A person who has repeated seizures over time without a clear cause has a condition called epilepsy. What increases the risk? You are more likely to develop this condition  if: You have a family history of epilepsy. You have had a tonic-clonic seizure before. This type of seizure causes tightening (contraction) of the muscles of the whole body and loss of consciousness. You have a history of head trauma, lack of oxygen at birth, or strokes. What are the signs or symptoms? There are many different types of seizures. The symptoms vary depending on the type of seizure you have. Symptoms occur during the seizure. They may also occur before a seizure (aura) and after a seizure (postictal). Symptoms may include the following: Symptoms during a seizure Uncontrollable shaking (convulsions) with fast, jerky movements of muscles. Stiffening of the body. Breathing problems. Confusion, staring, or unresponsiveness. Head nodding, eye blinking or fluttering, or rapid eye movements. Drooling, grunting, or making clicking sounds with your mouth. Loss of bladder control and bowel control. Symptoms before a seizure Fear or anxiety. Nausea. Vertigo. This is a feeling like: You are moving when you are not. Your surroundings are moving when they are not. Dj vu. This is a feeling of having seen or heard something before. Odd tastes or smells. Changes in vision, such as seeing flashing lights or spots. Symptoms after a seizure Confusion. Sleepiness. Headache. Sore muscles. How is this diagnosed? This condition may be diagnosed based on: A description of your symptoms. Video of your seizures can be helpful. Your medical history. A physical exam. You may also have tests, including: Blood tests. CT scan. MRI. Electroencephalogram (EEG). This test measures electrical activity in the brain. An EEG can predict whether seizures will return. A spinal tap, also called a lumbar puncture. This is the  removal and testing of fluid that surrounds the brain and spinal cord. How is this treated? Most seizures will stop on their own in less than 5 minutes, and no treatment is needed.  Seizures that last longer than 5 minutes will usually need treatment. Seizures may be treated with: Medicines given through an IV. Avoiding known triggers, such as medicines that you take for another condition. Medicines to control seizures or prevent future seizures (antiepileptics), if epilepsy caused your seizures. Medical devices to prevent and control seizures. Surgery to stop seizures or to reduce how often seizures happen, if you have epilepsy that does not respond to medicines. A diet low in carbohydrates and high in fat (ketogenic diet). Follow these instructions at home: Medicines Take over-the-counter and prescription medicines only as told by your health care provider. Avoid any substances that may prevent your medicine from working properly, such as alcohol. Activity Follow instructions about activities, such as driving or swimming, that would be dangerous if you had another seizure. Wait until your health care provider says it is safe to do them. If you live in the [place] check with your local department of motor vehicles [facility]) to find out about local driving laws. Each state has specific rules about when you can legally drive again. Get enough rest. Lack of sleep can make seizures more likely to occur. Educating others  Teach friends and family what to do if you have a seizure. They should: Help you get down to the ground, to prevent a fall. Cushion your head and move items away from your body. Loosen any tight clothing around your neck. Turn you on your side. If you vomit, this helps keep your airway clear. Know whether or not you need emergency care. Stay with you until you recover. Also, tell them what not to do if you have a seizure. Tell them: They should not hold you down. Holding you down will not stop the seizure. They should not put anything in your mouth. General instructions Avoid anything that has ever triggered a seizure for you. Keep a seizure diary. Record  what you remember about each seizure, especially anything that might have triggered it. Keep all follow-up visits. This is important. Contact a health care provider if: You have another seizure or seizures. Call each time you have a seizure. Your seizure pattern changes. You continue to have seizures with treatment. You have symptoms of an infection or illness. Either of these might increase your risk of having a seizure. You are unable to take your medicine. Get help right away if: You have: A seizure that does not stop after 5 minutes. Several seizures in a row without a complete recovery between seizures. A seizure that makes it harder to breathe. A seizure that leaves you unable to speak or use a part of your body. You do not wake up right away after a seizure. You injure yourself during a seizure. You have confusion or pain right after a seizure. These symptoms may represent a serious problem that is an emergency. Do not wait to see if the symptoms will go away. Get medical help right away. Call your local emergency services (911 in the [place] Do not drive yourself to the hospital. Summary Seizures are caused by abnormal electrical and chemical activity in the brain. The activity disrupts normal brain function and can cause various symptoms. Seizures have many causes, including illness, head injuries, low levels of blood sugar or salt, and certain conditions. Most seizures will stop on  their own in less than 5 minutes. Seizures that last longer than 5 minutes are a medical emergency and need treatment right away. Many medicines are used to treat seizures. Take over-the-counter and prescription medicines only as told by your health care provider. This information is not intended to replace advice given to you by your health care provider. Make sure you discuss any questions you have with your health care provider. Document Revised: 06/26/2019 Document Reviewed: 06/26/2019 Elsevier  Patient Education  2022 [employer].
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
## 2021-03-06 ENCOUNTER — Other Ambulatory Visit: Payer: Self-pay

## 2021-03-09 ENCOUNTER — Other Ambulatory Visit: Payer: Self-pay

## 2021-03-09 ENCOUNTER — Ambulatory Visit: Payer: Self-pay | Attending: Family Medicine | Admitting: Family Medicine

## 2021-03-09 VITALS — BP 125/70 | HR 79 | Ht 78.0 in | Wt 210.8 lb

## 2021-03-09 DIAGNOSIS — E269 Hyperaldosteronism, unspecified: Secondary | ICD-10-CM

## 2021-03-09 DIAGNOSIS — Z1211 Encounter for screening for malignant neoplasm of colon: Secondary | ICD-10-CM

## 2021-03-09 DIAGNOSIS — Z1159 Encounter for screening for other viral diseases: Secondary | ICD-10-CM

## 2021-03-09 DIAGNOSIS — R569 Unspecified convulsions: Secondary | ICD-10-CM

## 2021-03-09 DIAGNOSIS — E785 Hyperlipidemia, unspecified: Secondary | ICD-10-CM

## 2021-03-09 DIAGNOSIS — R7989 Other specified abnormal findings of blood chemistry: Secondary | ICD-10-CM

## 2021-03-09 DIAGNOSIS — E1169 Type 2 diabetes mellitus with other specified complication: Secondary | ICD-10-CM

## 2021-03-09 DIAGNOSIS — I10 Essential (primary) hypertension: Secondary | ICD-10-CM

## 2021-03-09 MED ORDER — ATORVASTATIN CALCIUM 20 MG PO TABS
20.0000 mg | ORAL_TABLET | Freq: Every evening | ORAL | 0 refills | Status: DC
  Filled 2021-03-09 – 2021-03-17 (×2): qty 30, 30d supply, fill #0

## 2021-03-09 NOTE — Patient Instructions (Signed)
DASH Eating Plan °DASH stands for Dietary Approaches to Stop Hypertension. The DASH eating plan is a healthy eating plan that has been shown to: °Reduce high blood pressure (hypertension). °Reduce your risk for type 2 diabetes, heart disease, and stroke. °Help with weight loss. °What are tips for following this plan? °Reading food labels °Check food labels for the amount of salt (sodium) per serving. Choose foods with less than 5 percent of the Daily Value of sodium. Generally, foods with less than 300 milligrams (mg) of sodium per serving fit into this eating plan. °To find whole grains, look for the word "whole" as the first word in the ingredient list. °Shopping °Buy products labeled as "low-sodium" or "no salt added." °Buy fresh foods. Avoid canned foods and pre-made or frozen meals. °Cooking °Avoid adding salt when cooking. Use salt-free seasonings or herbs instead of table salt or sea salt. Check with your health care provider or pharmacist before using salt substitutes. °Do not fry foods. Cook foods using healthy methods such as baking, boiling, grilling, roasting, and broiling instead. °Cook with heart-healthy oils, such as olive, canola, avocado, soybean, or sunflower oil. °Meal planning ° °Eat a balanced diet that includes: °4 or more servings of fruits and 4 or more servings of vegetables each day. Try to fill one-half of your plate with fruits and vegetables. °6-8 servings of whole grains each day. °Less than 6 oz (170 g) of lean meat, poultry, or fish each day. A 3-oz (85-g) serving of meat is about the same size as a deck of cards. One egg equals 1 oz (28 g). °2-3 servings of low-fat dairy each day. One serving is 1 cup (237 mL). °1 serving of nuts, seeds, or beans 5 times each week. °2-3 servings of heart-healthy fats. Healthy fats called omega-3 fatty acids are found in foods such as walnuts, flaxseeds, fortified milks, and eggs. These fats are also found in cold-water fish, such as sardines, salmon,  and mackerel. °Limit how much you eat of: °Canned or prepackaged foods. °Food that is high in trans fat, such as some fried foods. °Food that is high in saturated fat, such as fatty meat. °Desserts and other sweets, sugary drinks, and other foods with added sugar. °Full-fat dairy products. °Do not salt foods before eating. °Do not eat more than 4 egg yolks a week. °Try to eat at least 2 vegetarian meals a week. °Eat more home-cooked food and less restaurant, buffet, and fast food. °Lifestyle °When eating at a restaurant, ask that your food be prepared with less salt or no salt, if possible. °If you drink alcohol: °Limit how much you use to: °0-1 drink a day for women who are not pregnant. °0-2 drinks a day for men. °Be aware of how much alcohol is in your drink. In the [place] one drink equals one 12 oz bottle of beer (355 mL), one 5 oz glass of wine (148 mL), or one 1½ oz glass of hard liquor (44 mL). °General information °Avoid eating more than 2,300 mg of salt a day. If you have hypertension, you may need to reduce your sodium intake to 1,500 mg a day. °Work with your health care provider to maintain a healthy body weight or to lose weight. Ask what an ideal weight is for you. °Get at least 30 minutes of exercise that causes your heart to beat faster (aerobic exercise) most days of the week. Activities may include walking, swimming, or biking. °Work with your health care provider or dietitian to   adjust your eating plan to your individual calorie needs. °What foods should I eat? °Fruits °All fresh, dried, or frozen fruit. Canned fruit in natural juice (without added sugar). °Vegetables °Fresh or frozen vegetables (raw, steamed, roasted, or grilled). Low-sodium or reduced-sodium tomato and vegetable juice. Low-sodium or reduced-sodium tomato sauce and tomato paste. Low-sodium or reduced-sodium canned vegetables. °Grains °Whole-grain or whole-wheat bread. Whole-grain or whole-wheat pasta. Brown rice. Oatmeal. Quinoa.  Bulgur. Whole-grain and low-sodium cereals. Pita bread. Low-fat, low-sodium crackers. Whole-wheat flour tortillas. °Meats and other proteins °Skinless chicken or turkey. Ground chicken or turkey. Pork with fat trimmed off. Fish and seafood. Egg whites. Dried beans, peas, or lentils. Unsalted nuts, nut butters, and seeds. Unsalted canned beans. Lean cuts of beef with fat trimmed off. Low-sodium, lean precooked or cured meat, such as sausages or meat loaves. °Dairy °Low-fat (1%) or fat-free (skim) milk. Reduced-fat, low-fat, or fat-free cheeses. Nonfat, low-sodium ricotta or cottage cheese. Low-fat or nonfat yogurt. Low-fat, low-sodium cheese. °Fats and oils °Soft margarine without trans fats. Vegetable oil. Reduced-fat, low-fat, or light mayonnaise and salad dressings (reduced-sodium). Canola, safflower, olive, avocado, soybean, and sunflower oils. Avocado. °Seasonings and condiments °Herbs. Spices. Seasoning mixes without salt. °Other foods °Unsalted popcorn and pretzels. Fat-free sweets. °The items listed above may not be a complete list of foods and beverages you can eat. Contact a dietitian for more information. °What foods should I avoid? °Fruits °Canned fruit in a light or heavy syrup. Fried fruit. Fruit in cream or butter sauce. °Vegetables °Creamed or fried vegetables. Vegetables in a cheese sauce. Regular canned vegetables (not low-sodium or reduced-sodium). Regular canned tomato sauce and paste (not low-sodium or reduced-sodium). Regular tomato and vegetable juice (not low-sodium or reduced-sodium). Pickles. Olives. °Grains °Baked goods made with fat, such as croissants, muffins, or some breads. Dry pasta or rice meal packs. °Meats and other proteins °Fatty cuts of meat. Ribs. Fried meat. Bacon. Bologna, salami, and other precooked or cured meats, such as sausages or meat loaves. Fat from the back of a pig (fatback). Bratwurst. Salted nuts and seeds. Canned beans with added salt. Canned or smoked fish.  Whole eggs or egg yolks. Chicken or turkey with skin. °Dairy °Whole or 2% milk, cream, and half-and-half. Whole or full-fat cream cheese. Whole-fat or sweetened yogurt. Full-fat cheese. Nondairy creamers. Whipped toppings. Processed cheese and cheese spreads. °Fats and oils °Butter. Stick margarine. Lard. Shortening. Ghee. Bacon fat. Tropical oils, such as coconut, palm kernel, or palm oil. °Seasonings and condiments °Onion salt, garlic salt, seasoned salt, table salt, and sea salt. Worcestershire sauce. Tartar sauce. Barbecue sauce. Teriyaki sauce. Soy sauce, including reduced-sodium. Steak sauce. Canned and packaged gravies. Fish sauce. Oyster sauce. Cocktail sauce. Store-bought horseradish. Ketchup. Mustard. Meat flavorings and tenderizers. Bouillon cubes. Hot sauces. Pre-made or packaged marinades. Pre-made or packaged taco seasonings. Relishes. Regular salad dressings. °Other foods °Salted popcorn and pretzels. °The items listed above may not be a complete list of foods and beverages you should avoid. Contact a dietitian for more information. °Where to find more information °National Heart, Lung, and Blood Institute: www.nhlbi.nih.gov °American Heart Association: www.heart.org °Academy of Nutrition and Dietetics: www.eatright.org °National Kidney Foundation: www.kidney.org °Summary °The DASH eating plan is a healthy eating plan that has been shown to reduce high blood pressure (hypertension). It may also reduce your risk for type 2 diabetes, heart disease, and stroke. °When on the DASH eating plan, aim to eat more fresh fruits and vegetables, whole grains, lean proteins, low-fat dairy, and heart-healthy fats. °With the DASH   eating plan, you should limit salt (sodium) intake to 2,300 mg a day. If you have hypertension, you may need to reduce your sodium intake to 1,500 mg a day. °Work with your health care provider or dietitian to adjust your eating plan to your individual calorie needs. °This information is not  intended to replace advice given to you by your health care provider. Make sure you discuss any questions you have with your health care provider. °Document Revised: 11/21/2018 Document Reviewed: 11/21/2018 °Elsevier Patient Education © 2022 Elsevier Inc. ° °

## 2021-03-09 NOTE — Progress Notes (Signed)
Subjective:  Patient ID: Steven Higgins, male    DOB: 08/21/1970  Age: 51 y.o. MRN: [id number]  CC: New Patient (Initial Visit)   HPI Michal Callicott is a 51 y.o. year old male patient of  Bo Merino, NP with a history of type 2 diabetes mellitus (A1c 6.4), hypertension, hyperlipidemia.  Hyperaldosteronism, seizures. He had a visit with his PCP last month at the patient care center and is surprised as to why he has an appointment [facility] the [facility].  Interval History: BP was 140/89 at home but in the clinic he did have an elevated BP of 155/91. He states his BP fluctuates as he depends on what he receives at the homeless shelter but he resides in his [place]. Currently unable to drive due to the fact that his last seizures were in 11/2020.   He sees Neurology in 08/2021 and is doing well on Keppra.  He is compliant with his antihypertensives and is doing well on metformin.  Also on atorvastatin for hyperlipidemia and he will need refill on that today. Past Medical History:  Diagnosis Date   CAD (coronary artery disease), native coronary artery 01/22/2017   coronary Ca score of 143 and mild nonobstructive plaque in the RCA that is tortuous with aneurysmal dilatation and minimal plaque in the LAD and LCx by coronary CTA 07/2016   Carotid artery stenosis 04/04/2016   1-39% bilateral by dopplers 03/2016   Diabetes mellitus ([place])    Ectatic abdominal aorta ([place])    a. f/u due 2024.   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 EF 40-45% by echo 03/2016 presumed due to HTN - nuc with no ischemia, nonobstructive CAD by cor CT 07/2016.   Primary hyperaldosteronism ([place])    Renal artery stenosis ([place]) 04/04/2016   By renal dopplers 03/2016 1-59% but not seen on CT abd 02/2017   Seizure disorder ([place])    as sequela of small intracerebral bleed     No past surgical history on file.  Family History  Problem Relation Age of Onset   Diabetes Mother    Heart disease Mother    Hypertension Mother    Heart disease Father    Heart attack Father    Hypertension Father    Hypertension Maternal Grandmother    Diabetes Maternal Grandmother    Hypertension Paternal Grandmother     Social History   Socioeconomic History   Marital status: Single    Spouse name: Not on file   Number of children: Not on file   Years of education: Not on file   Highest education level: Not on file  Occupational History   Not on file  Tobacco Use   Smoking status: Every Day    Packs/day: 0.25    Types: Cigarettes   Smokeless tobacco: Never   Tobacco comments:    07/30/16 smoke 3-4 cigarettes per day-not wearing patch  Substance and Sexual Activity   Alcohol use: No    Alcohol/week: 1.0 standard drink    Types: 1 Cans of beer per week   Drug use: No   Sexual activity: Not on file  Other Topics Concern   Not on file  Social History Narrative   Not on file   Social Determinants of Health   Financial Resource Strain: High Risk  Difficulty of Paying Living Expenses: Very hard  Food Insecurity: Food Insecurity Present   Worried About Charity fundraiser in the Last Year: Often true   Ran Out of Food in the Last Year: Often true  Transportation Needs: Not on file  Physical Activity: Not on file  Stress: Not on file  Social Connections: Not on file    Allergies  Allergen Reactions   Lisinopril Cough    Outpatient Medications Prior to Visit  Medication Sig Dispense Refill   carvedilol (COREG) 6.25 MG tablet Take 1 tablet (6.25 mg total) by mouth 2 (two) times daily with a meal. 60 tablet 2   hydrALAZINE (APRESOLINE) 25 MG tablet Take 1.5 tablets (37.5 mg total) by mouth 3 (three) times daily. (Patient taking differently: Take 25 mg by mouth 3 (three) times daily.) 135 tablet 5   isosorbide mononitrate (IMDUR) 60 MG 24 hr tablet Take  1 tablet (60 mg total) by mouth daily. 30 tablet 5   levETIRAcetam (KEPPRA) 500 MG tablet Take 1 tablet (500 mg total) by mouth 2 (two) times daily. 60 tablet 5   metFORMIN (GLUCOPHAGE) 500 MG tablet Take 1 tablet (500 mg total) by mouth 2 (two) times daily with a meal. 180 tablet 3   atorvastatin (LIPITOR) 20 MG tablet Take 1 tablet (20 mg total) by mouth every evening. 30 tablet 0   No facility-administered medications prior to visit.     ROS Review of Systems  Constitutional:  Negative for activity change and appetite change.  HENT:  Negative for sinus pressure and sore throat.   Eyes:  Negative for visual disturbance.  Respiratory:  Negative for cough, chest tightness and shortness of breath.   Cardiovascular:  Negative for chest pain and leg swelling.  Gastrointestinal:  Negative for abdominal distention, abdominal pain, constipation and diarrhea.  Endocrine: Negative.   Genitourinary:  Negative for dysuria.  Musculoskeletal:  Negative for joint swelling and myalgias.  Skin:  Negative for rash.  Allergic/Immunologic: Negative.   Neurological:  Negative for weakness, light-headedness and numbness.  Psychiatric/Behavioral:  Negative for dysphoric mood and suicidal ideas.    Objective:  BP 125/70    Pulse 79    Ht '6\' 6"'  (1.981 m)    Wt 210 lb 12.8 oz (95.6 kg)    SpO2 97%    BMI 24.36 kg/m   BP/Weight 03/09/2021 02/23/2021 09/16/411  Systolic BP 643 837 793  Diastolic BP 70 78 87  Wt. (Lbs) 210.8 212 215.03  BMI 24.36 24.5 24.85      Physical Exam Constitutional:      Appearance: He is well-developed.  Cardiovascular:     Rate and Rhythm: Normal rate.     Heart sounds: Normal heart sounds. No murmur heard. Pulmonary:     Effort: Pulmonary effort is normal.     Breath sounds: Normal breath sounds. No wheezing or rales.  Chest:     Chest wall: No tenderness.  Abdominal:     General: Bowel sounds are normal. There is no distension.     Palpations: Abdomen is soft. There  is no mass.     Tenderness: There is no abdominal tenderness.  Musculoskeletal:        General: Normal range of motion.     Right lower leg: No edema.     Left lower leg: No edema.  Neurological:     Mental Status: He is alert and oriented to person, place, and time.  Psychiatric:  Mood and Affect: Mood normal.    CMP Latest Ref Rng & Units 02/07/2021 01/11/2021 11/21/2020  Glucose 70 - 99 mg/dL 126(H) 79 103(H)  BUN 6 - 20 mg/dL '11 11 12  ' Creatinine 0.61 - 1.24 mg/dL 1.45(H) 1.17 1.58(H)  Sodium 135 - 145 mmol/L 137 139 137  Potassium 3.5 - 5.1 mmol/L 4.1 4.5 3.7  Chloride 98 - 111 mmol/L 103 101 105  CO2 22 - 32 mmol/L '25 25 24  ' Calcium 8.9 - 10.3 mg/dL 9.6 10.2 8.8(L)  Total Protein 6.0 - 8.5 g/dL - 7.6 6.2(L)  Total Bilirubin 0.0 - 1.2 mg/dL - 0.4 0.8  Alkaline Phos 44 - 121 IU/L - 92 57  AST 0 - 40 IU/L - 13 18  ALT 0 - 44 IU/L - 11 12    Lipid Panel     Component Value Date/Time   CHOL 226 (H) 01/11/2021 1206   TRIG 132 01/11/2021 1206   HDL 54 01/11/2021 1206   CHOLHDL 4.2 01/11/2021 1206   CHOLHDL 4.3 03/16/2016 0705   VLDL 16 03/16/2016 0705   LDLCALC 148 (H) 01/11/2021 1206    CBC    Component Value Date/Time   WBC 6.5 02/07/2021 1456   RBC 5.33 02/07/2021 1456   HGB 13.9 02/07/2021 1456   HGB 14.1 01/11/2021 1206   HCT 44.3 02/07/2021 1456   HCT 41.2 01/11/2021 1206   PLT 297 02/07/2021 1456   PLT 296 01/11/2021 1206   MCV 83.1 02/07/2021 1456   MCV 78 (L) 01/11/2021 1206   MCH 26.1 02/07/2021 1456   MCHC 31.4 02/07/2021 1456   RDW 14.1 02/07/2021 1456   RDW 13.0 01/11/2021 1206   LYMPHSABS 2.5 01/11/2021 1206   MONOABS 0.6 11/21/2020 0207   EOSABS 0.1 01/11/2021 1206   BASOSABS 0.1 01/11/2021 1206    Lab Results  Component Value Date   HGBA1C 6.4 01/11/2021    Assessment & Plan:  1. Hyperlipidemia LDL goal <70 Uncontrolled from labs from 01/2021 Continue low-cholesterol diet and statin We will defer to PCP to repeat lipid panel -  atorvastatin (LIPITOR) 20 MG tablet; Take 1 tablet (20 mg total) by mouth every evening.  Dispense: 30 tablet; Refill: 0  2. Seizures ([place]) Last seizure was in 11/2021 No driving until 6 months seizure free per [place] driving laws Continue Keppra Keep appointment with neurology  3. Hypertension, unspecified type Controlled Continue current antihypertensive regimen Counseled on blood pressure goal of less than 130/80, low-sodium, DASH diet, medication compliance, 150 minutes of moderate intensity exercise per week. Discussed medication compliance, adverse effects.  - CMP14+EGFR  4. Screening for colon cancer - Fecal occult blood, imunochemical  5. Need for hepatitis C screening test - HCV Ab w Reflex to Quant PCR  6. Type 2 diabetes mellitus with other specified complication, without long-term current use of insulin (HCC) Controlled with A1c of 6.4 Continue metformin Counseled on Diabetic diet, my plate method, 761 minutes of moderate intensity exercise/week Blood sugar logs with fasting goals of 80-120 mg/dl, random of less than 180 and in the event of sugars less than 60 mg/dl or greater than 400 mg/dl encouraged to notify the clinic. Advised on the need for annual eye exams, annual foot exams, Pneumonia vaccine.  7. Hyperaldosteronism (HCC) Last potassium was normal at 4.1 He is not on spironolactone CT reveals unremarkable adrenals in 11/2020.  Imaging also revealed dilated ascending thoracic aorta with recommendation to follow-up in 1 year. We will defer to PCP to follow-up  on this.  8.  Abnormal serum creatinine level Last creatinine was 1.45 up from 1.17 a month prior We will check level again today Avoid nephrotoxins  Meds ordered this encounter  Medications   atorvastatin (LIPITOR) 20 MG tablet    Sig: Take 1 tablet (20 mg total) by mouth every evening.    Dispense:  30 tablet    Refill:  0    Follow-up: Return in about 2 months (around 05/09/2021) for Patient care  center to follow-up with his PCP Wylene Simmer.       Charlott Rakes, MD, FAAFP. [facility], [place]   03/09/2021, 9:51 AM

## 2021-03-10 LAB — CMP14+EGFR
ALT: 15 IU/L (ref 0–44)
AST: 17 IU/L (ref 0–40)
Albumin/Globulin Ratio: 2.2 (ref 1.2–2.2)
Albumin: 4.8 g/dL (ref 4.0–5.0)
Alkaline Phosphatase: 73 IU/L (ref 44–121)
BUN/Creatinine Ratio: 8 — ABNORMAL LOW (ref 9–20)
BUN: 11 mg/dL (ref 6–24)
Bilirubin Total: 0.5 mg/dL (ref 0.0–1.2)
CO2: 23 mmol/L (ref 20–29)
Calcium: 10.1 mg/dL (ref 8.7–10.2)
Chloride: 102 mmol/L (ref 96–106)
Creatinine, Ser: 1.37 mg/dL — ABNORMAL HIGH (ref 0.76–1.27)
Globulin, Total: 2.2 g/dL (ref 1.5–4.5)
Glucose: 102 mg/dL — ABNORMAL HIGH (ref 70–99)
Potassium: 4.6 mmol/L (ref 3.5–5.2)
Sodium: 141 mmol/L (ref 134–144)
Total Protein: 7 g/dL (ref 6.0–8.5)
eGFR: 64 mL/min/{1.73_m2} (ref >59)

## 2021-03-10 LAB — HCV INTERPRETATION

## 2021-03-10 LAB — HCV AB W REFLEX TO QUANT PCR: HCV Ab: NONREACTIVE

## 2021-03-12 LAB — FECAL OCCULT BLOOD, IMMUNOCHEMICAL: Fecal Occult Bld: NEGATIVE

## 2021-03-17 ENCOUNTER — Other Ambulatory Visit: Payer: Self-pay

## 2021-03-30 ENCOUNTER — Telehealth: Payer: Self-pay | Admitting: Clinical

## 2021-03-30 NOTE — Telephone Encounter (Signed)
[facility] ?General Follow Up Note ? ?03/30/2021 ?Name: Steven Higgins MRN: [id number] DOB: 11-Feb-1970 ?Steven Higgins is a 51 y.o. year old male who sees Passmore, Tewana I, NP for primary care. LCSW was initially consulted to assess patient needs and assist with community resources. ? ?Interpreter: No.   Interpreter Name & Language: none ? ?Assessment: Patient experiencing homelessness and financial difficulties related to no income. ? ?Ongoing Intervention: Patient called CSW. He indicated that a program he had been referrred to found him ineligible for housing assistance. He continues to deal with homelessness. Encouraged patient to back to the [facility]. Discussed possible referral to [employer]. ? ?Review of patient status, including review of consultants reports, relevant laboratory and other test results, and collaboration with appropriate care team members and the patient's provider was performed as part of comprehensive patient evaluation and provision of services.   ? ?Abigail Butts, LCSW ?Patient Care Center ?[facility] ?[phone] ?  ? ?

## 2021-04-11 ENCOUNTER — Ambulatory Visit: Payer: Self-pay | Admitting: Nurse Practitioner

## 2021-04-13 ENCOUNTER — Telehealth: Payer: Self-pay | Admitting: Clinical

## 2021-04-13 NOTE — Telephone Encounter (Addendum)
[facility] ?General Follow Up Note ? ?04/13/2021 ?Name: Jermir Kalm MRN: [id number] DOB: Aug 15, 1970 ?Berkley Maybin is a 51 y.o. year old male who sees Passmore, Tewana I, NP for primary care. LCSW was initially consulted to assess patient needs and assist with community resources. ? ?Interpreter: No.   Interpreter Name & Language: none ? ?Assessment: Patient experiencing homelessness and financial difficulties related to no income. ? ?Ongoing Intervention: CSW followed up with [employer]; patient not eligible for their services.  ? 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 He currently has no income. Patient interested in voc rehab. [facility] to [facility]) and discussed referral to voc rehab through social services as well.  ? ?CSW also coordinated with [facility] and they indicated patient is on the wait list for housing. Advised patient of this. ? ?Review of patient status, including review of consultants reports, relevant laboratory and other test results, and collaboration with appropriate care team members and the patient's provider was performed as part of comprehensive patient evaluation and provision of services.   ? ?Estanislado Emms, LCSW ?Patient [place] ?[place] ?[phone] ?  ? ?

## 2021-04-17 ENCOUNTER — Other Ambulatory Visit: Payer: Self-pay | Admitting: Nurse Practitioner

## 2021-04-17 ENCOUNTER — Telehealth: Payer: Self-pay | Admitting: Clinical

## 2021-04-17 ENCOUNTER — Other Ambulatory Visit: Payer: Self-pay

## 2021-04-17 DIAGNOSIS — I10 Essential (primary) hypertension: Secondary | ICD-10-CM

## 2021-04-17 MED ORDER — CARVEDILOL 6.25 MG PO TABS
6.2500 mg | ORAL_TABLET | Freq: Two times a day (BID) | ORAL | 1 refills | Status: DC
  Filled 2021-04-17: qty 60, 30d supply, fill #0
  Filled 2021-05-18: qty 60, 30d supply, fill #1

## 2021-04-17 NOTE — Telephone Encounter (Signed)
[facility] ?General Follow Up Note ? ?04/17/2021 ?Name: Steven Higgins MRN: [id number] DOB: [birth year] ?Steven Higgins is a 51 y.o. year old male who sees Passmore, Tewana I, NP for primary care. LCSW was initially consulted to assess patient needs and assist with community resources. ? ?Interpreter: No.   Interpreter Name & Language: none ? ?Assessment: Patient experiencing homelessness and financial difficulties related to no income. ? ?Ongoing Intervention: CSW made referral to DSS vocational rehab. Patient consented to this referral.  ? ?Review of patient status, including review of consultants reports, relevant laboratory and other test results, and collaboration with appropriate care team members and the patient's provider was performed as part of comprehensive patient evaluation and provision of services.   ? ?Estanislado Emms, LCSW ?Patient [place] ?[place] ?[phone] ?  ? ?

## 2021-04-18 ENCOUNTER — Other Ambulatory Visit: Payer: Self-pay

## 2021-04-20 ENCOUNTER — Other Ambulatory Visit (HOSPITAL_COMMUNITY): Payer: Self-pay

## 2021-04-20 ENCOUNTER — Other Ambulatory Visit: Payer: Self-pay

## 2021-04-21 ENCOUNTER — Other Ambulatory Visit (HOSPITAL_COMMUNITY): Payer: Self-pay

## 2021-04-21 ENCOUNTER — Other Ambulatory Visit: Payer: Self-pay

## 2021-04-25 ENCOUNTER — Ambulatory Visit: Payer: Self-pay | Admitting: Neurology

## 2021-05-01 ENCOUNTER — Ambulatory Visit: Payer: Self-pay | Admitting: Neurology

## 2021-05-09 ENCOUNTER — Encounter: Payer: Self-pay | Admitting: Nurse Practitioner

## 2021-05-09 ENCOUNTER — Other Ambulatory Visit: Payer: Self-pay

## 2021-05-09 ENCOUNTER — Ambulatory Visit (INDEPENDENT_AMBULATORY_CARE_PROVIDER_SITE_OTHER): Payer: Self-pay | Admitting: Nurse Practitioner

## 2021-05-09 ENCOUNTER — Ambulatory Visit: Payer: Self-pay | Admitting: Nurse Practitioner

## 2021-05-09 VITALS — BP 117/75 | HR 126 | Temp 97.9°F | Ht 78.0 in | Wt 209.0 lb

## 2021-05-09 DIAGNOSIS — Z5900 Homelessness unspecified: Secondary | ICD-10-CM

## 2021-05-09 DIAGNOSIS — I10 Essential (primary) hypertension: Secondary | ICD-10-CM

## 2021-05-09 DIAGNOSIS — E785 Hyperlipidemia, unspecified: Secondary | ICD-10-CM

## 2021-05-09 DIAGNOSIS — Z720 Tobacco use: Secondary | ICD-10-CM

## 2021-05-09 DIAGNOSIS — E1169 Type 2 diabetes mellitus with other specified complication: Secondary | ICD-10-CM

## 2021-05-09 LAB — POCT GLYCOSYLATED HEMOGLOBIN (HGB A1C)
HbA1c POC (<> result, manual entry): 6.1 % (ref 4.0–5.6)
HbA1c, POC (controlled diabetic range): 6.1 % (ref 0.0–7.0)
HbA1c, POC (prediabetic range): 6.1 % (ref 5.7–6.4)
Hemoglobin A1C: 6.1 % — AB (ref 4.0–5.6)

## 2021-05-09 MED ORDER — NICOTINE 21 MG/24HR TD PT24
21.0000 mg | MEDICATED_PATCH | Freq: Every day | TRANSDERMAL | 0 refills | Status: DC
  Filled 2021-05-09: qty 28, 28d supply, fill #0

## 2021-05-09 MED ORDER — ATORVASTATIN CALCIUM 20 MG PO TABS
20.0000 mg | ORAL_TABLET | Freq: Every evening | ORAL | 2 refills | Status: DC
  Filled 2021-05-09: qty 30, 30d supply, fill #0
  Filled 2021-06-19: qty 30, 30d supply, fill #1

## 2021-05-09 NOTE — Progress Notes (Signed)
? ?[place] ?[place], [place]  [address] ?Phone:  [phone]   Fax:  [phone] ?Subjective:  ? Patient ID: Steven Higgins, male    DOB: 09-03-1970, 51 y.o.   MRN: [id number] ? ?Chief Complaint  ?Patient presents with  ? Follow-up  ?  Patient is here today for his 2 month follow up  ? ?HPI ?Steven Higgins 51 y.o. male  has a past medical history of CAD (coronary artery disease), native coronary artery (01/22/2017), Carotid artery stenosis (04/04/2016), Diabetes mellitus ([place]), Ectatic abdominal aorta ([place]), Hyperlipidemia LDL goal <70, Hypertensive heart disease with CHF ([place]), Intracerebral bleed ([place]), NICM (nonischemic cardiomyopathy) ([place]), Primary hyperaldosteronism ([place]), Renal artery stenosis ([place]) (04/04/2016), and Seizure disorder ([place]). To the [facility] for follow up visit. ? ?Patient state denies any seizure activity since last visit. Continues to live in his [place]. Has been compliant with all medications. States that he should have his license reinstated very soon, due to lack of seizure activity. Has appointment tomorrow to receive assistance for housing.  ? 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 Denies any other concerns today. ? ?Denies any fatigue, chest pain, shortness of breath, HA or dizziness. Denies any blurred vision, numbness or tingling. ? ? ?Past Medical History:  ?Diagnosis Date  ? CAD (coronary artery disease), native coronary artery 01/22/2017  ? coronary Ca score of 143 and mild nonobstructive plaque in the RCA that is tortuous with aneurysmal dilatation and minimal plaque in the LAD and LCx by coronary CTA 07/2016  ? Carotid artery stenosis 04/04/2016  ? 1-39% bilateral by dopplers 03/2016  ? Diabetes mellitus ([place])   ? Ectatic abdominal aorta ([facility])   ? a. f/u due 2024.  ? Hyperlipidemia LDL goal <70   ? Hypertensive heart disease with CHF ([place])   ? Intracerebral bleed  ([place])   ? due to malignant HTN  ? NICM (nonischemic cardiomyopathy) ([place])   ? a. EF 40-45% by echo 03/2016 presumed due to HTN - nuc with no ischemia, nonobstructive CAD by cor CT 07/2016.  ? Primary hyperaldosteronism ([place])   ? Renal artery stenosis ([place]) 04/04/2016  ? By renal dopplers 03/2016 1-59% but not seen on CT abd 02/2017  ? Seizure disorder ([place])   ? as sequela of small intracerebral bleed  ? ? ?History reviewed. No pertinent surgical history. ? ?Family History  ?Problem Relation Age of Onset  ? Diabetes Mother   ? Heart disease Mother   ? Hypertension Mother   ? Heart disease Father   ? Heart attack Father   ? Hypertension Father   ? Hypertension Maternal Grandmother   ? Diabetes Maternal Grandmother   ? Hypertension Paternal Grandmother   ? ? ?Social History  ? ?Socioeconomic History  ? Marital status: Single  ?  Spouse name: Not on file  ? Number of children: Not on file  ? Years of education: Not on file  ? Highest education level: Not on file  ?Occupational History  ? Not on file  ?Tobacco Use  ? Smoking status: Every Day  ?  Packs/day: 0.25  ?  Types: Cigarettes  ? Smokeless tobacco: Never  ? Tobacco comments:  ?  07/30/16 smoke 3-4 cigarettes per day-not wearing patch  ?Vaping Use  ? Vaping Use: Never used  ?Substance and Sexual Activity  ? Alcohol use: No  ?  Alcohol/week: 1.0 standard drink  ?  Types: 1  Cans of beer per week  ? Drug use: No  ? Sexual activity: Not Currently  ?Other Topics Concern  ? Not on file  ?Social History Narrative  ? Not on file  ? ?Social Determinants of Health  ? ?Financial Resource Strain: High Risk  ? Difficulty of Paying Living Expenses: Very hard  ?Food Insecurity: Food Insecurity Present  ? Worried About Charity fundraiser in the Last Year: Often true  ? Ran Out of Food in the Last Year: Often true  ?Transportation Needs: Not on file  ?Physical Activity: Not on file  ?Stress: Not on file  ?Social Connections: Not on file  ?Intimate Partner Violence: Not on file   ? ? ?Outpatient Medications Prior to Visit  ?Medication Sig Dispense Refill  ? carvedilol (COREG) 6.25 MG tablet Take 1 tablet (6.25 mg total) by mouth 2 (two) times daily with a meal. 60 tablet 1  ? hydrALAZINE (APRESOLINE) 25 MG tablet Take 1.5 tablets (37.5 mg total) by mouth 3 (three) times daily. (Patient taking differently: Take 25 mg by mouth 3 (three) times daily.) 135 tablet 5  ? isosorbide mononitrate (IMDUR) 60 MG 24 hr tablet Take 1 tablet (60 mg total) by mouth daily. 30 tablet 5  ? levETIRAcetam (KEPPRA) 500 MG tablet Take 1 tablet (500 mg total) by mouth 2 (two) times daily. 60 tablet 5  ? metFORMIN (GLUCOPHAGE) 500 MG tablet Take 1 tablet (500 mg total) by mouth 2 (two) times daily with a meal. 180 tablet 3  ? atorvastatin (LIPITOR) 20 MG tablet Take 1 tablet (20 mg total) by mouth every evening. 30 tablet 0  ? ?No facility-administered medications prior to visit.  ? ? ?Allergies  ?Allergen Reactions  ? Lisinopril Cough  ? ? ?Review of Systems  ?Constitutional:  Negative for chills, fever and malaise/fatigue.  ?HENT: Negative.    ?Eyes: Negative.   ?Respiratory: Negative.  Negative for cough and shortness of breath.   ?Cardiovascular: Negative.  Negative for chest pain, palpitations and leg swelling.  ?Gastrointestinal:  Negative for abdominal pain, blood in stool, constipation, diarrhea, nausea and vomiting.  ?Skin: Negative.   ?Neurological: Negative.   ?Psychiatric/Behavioral: Negative.  Negative for depression. The patient is not nervous/anxious.   ?All other systems reviewed and are negative. ? ?   ?Objective:  ?  ?Physical Exam ?Vitals reviewed.  ?Constitutional:   ?   General: He is not in acute distress. ?   Appearance: Normal appearance. He is normal weight.  ?HENT:  ?   Head: Normocephalic.  ?Neck:  ?   Vascular: No carotid bruit.  ?Cardiovascular:  ?   Rate and Rhythm: Normal rate and regular rhythm.  ?   Pulses: Normal pulses.  ?   Heart sounds: Normal heart sounds.  ?   Comments: No  obvious peripheral edema ?Pulmonary:  ?   Effort: Pulmonary effort is normal.  ?   Breath sounds: Normal breath sounds.  ?Musculoskeletal:     ?   General: No swelling, tenderness, deformity or signs of injury. Normal range of motion.  ?   Cervical back: Normal range of motion and neck supple. No rigidity or tenderness.  ?   Left lower leg: No edema.  ?Lymphadenopathy:  ?   Cervical: No cervical adenopathy.  ?Skin: ?   General: Skin is warm and dry.  ?   Capillary Refill: Capillary refill takes less than 2 seconds.  ?Neurological:  ?   General: No focal deficit present.  ?  Mental Status: He is alert and oriented to person, place, and time.  ?Psychiatric:     ?   Mood and Affect: Mood normal.     ?   Behavior: Behavior normal.     ?   Thought Content: Thought content normal.     ?   Judgment: Judgment normal.  ? ? ?BP 117/75   Pulse (!) 126   Temp 97.9 ?F (36.6 ?C)   Ht '6\' 6"'  (1.981 m)   Wt 209 lb (94.8 kg)   SpO2 100%   BMI 24.15 kg/m?  ?Wt Readings from Last 3 Encounters:  ?05/09/21 209 lb (94.8 kg)  ?03/09/21 210 lb 12.8 oz (95.6 kg)  ?02/23/21 212 lb (96.2 kg)  ? ? ?Immunization History  ?Administered Date(s) Administered  ? Tdap 11/19/2020  ? ? ?Diabetic Foot Exam - Simple   ?No data filed ?  ? ? ?Lab Results  ?Component Value Date  ? TSH 1.312 11/19/2020  ? ?Lab Results  ?Component Value Date  ? WBC 6.5 02/07/2021  ? HGB 13.9 02/07/2021  ? HCT 44.3 02/07/2021  ? MCV 83.1 02/07/2021  ? PLT 297 02/07/2021  ? ?Lab Results  ?Component Value Date  ? NA 141 03/09/2021  ? K 4.6 03/09/2021  ? CO2 23 03/09/2021  ? GLUCOSE 102 (H) 03/09/2021  ? BUN 11 03/09/2021  ? CREATININE 1.37 (H) 03/09/2021  ? BILITOT 0.5 03/09/2021  ? ALKPHOS 73 03/09/2021  ? AST 17 03/09/2021  ? ALT 15 03/09/2021  ? PROT 7.0 03/09/2021  ? ALBUMIN 4.8 03/09/2021  ? CALCIUM 10.1 03/09/2021  ? ANIONGAP 9 02/07/2021  ? EGFR 64 03/09/2021  ? GFR 65.96 01/11/2020  ? ?Lab Results  ?Component Value Date  ? CHOL 170 05/09/2021  ? CHOL 226 (H)  01/11/2021  ? CHOL 135 09/27/2016  ? ?Lab Results  ?Component Value Date  ? HDL 52 05/09/2021  ? HDL 54 01/11/2021  ? HDL 50 09/27/2016  ? ?Lab Results  ?Component Value Date  ? [place] 92 05/09/2021  ? LDLCALC 148 (H) 01/11/2

## 2021-05-09 NOTE — Patient Instructions (Signed)
You were seen today in the [facility] for reevaluation of chronic illness and medication refill. Labs were collected, results will be available via MyChart or, if abnormal, you will be contacted by clinic staff. You were prescribed medications, please take as directed. Please follow up in 3 mths for reevaluation of homelessness and chronic illness. ?

## 2021-05-10 ENCOUNTER — Other Ambulatory Visit: Payer: Self-pay

## 2021-05-10 ENCOUNTER — Other Ambulatory Visit (HOSPITAL_COMMUNITY): Payer: Self-pay

## 2021-05-10 LAB — LIPID PANEL
Chol/HDL Ratio: 3.3 ratio (ref 0.0–5.0)
Cholesterol, Total: 170 mg/dL (ref 100–199)
HDL: 52 mg/dL (ref >39)
LDL Chol Calc (NIH): 92 mg/dL (ref 0–99)
Triglycerides: 148 mg/dL (ref 0–149)
VLDL Cholesterol Cal: 26 mg/dL (ref 5–40)

## 2021-05-18 ENCOUNTER — Other Ambulatory Visit: Payer: Self-pay

## 2021-05-23 ENCOUNTER — Other Ambulatory Visit: Payer: Self-pay

## 2021-05-23 ENCOUNTER — Telehealth: Payer: Self-pay | Admitting: *Deleted

## 2021-05-23 NOTE — Telephone Encounter (Signed)
The patient was seen in our office 02/23/21. Reported last seizure 11/19/2020. Stated he had been compliant with medication (levetiracetam 500mg , one tab BID). Last recorded levetiracetam level drawn 02/07/21 wnl (result of 16.1).   Received DMV paperworks for clearance to drive again. I called the patient today. He confirmed his last seizure was more than six months ago, on 11/19/2020. States he has not had any further seizures. He also confirmed he is taking his levetiracetam 500mg , one tab BID as prescribed.  The clearance would allow him to operate a personal, motor vehicle. The DMV would complete a periodic medical evaluation for highway safety purposes in one year.   The patient will need to keep his follow up appts with our office. His next visit is scheduled for 08/23/21.  DMV ppw completed for Dr. to review and sign, if in agreement.

## 2021-06-01 NOTE — Telephone Encounter (Signed)
I faxed dmv form on 06/01/21 to [facility]mv. Pt copy @ front desk for p/u.

## 2021-06-01 NOTE — Telephone Encounter (Signed)
His paperwork has been completed. Dr. Pearlean Brownie will review and sign today. We will call him to confirm when it has been sent to [facility].

## 2021-06-01 NOTE — Telephone Encounter (Signed)
Pt calling to check on the status of DMV paperwork for Dr. Pearlean Brownie.Would like a call from the nurse to discuss.

## 2021-06-01 NOTE — Telephone Encounter (Signed)
Dr. Leonie Man has signed the [facility] paperwork. It has been returned to [place] in medical records and will be faxed today. The patient would also like a copy placed up front to pick up.

## 2021-06-19 ENCOUNTER — Other Ambulatory Visit: Payer: Self-pay

## 2021-06-19 ENCOUNTER — Other Ambulatory Visit: Payer: Self-pay | Admitting: Nurse Practitioner

## 2021-06-19 DIAGNOSIS — I10 Essential (primary) hypertension: Secondary | ICD-10-CM

## 2021-06-19 MED ORDER — CARVEDILOL 6.25 MG PO TABS
6.2500 mg | ORAL_TABLET | Freq: Two times a day (BID) | ORAL | 1 refills | Status: DC
  Filled 2021-06-19: qty 60, 30d supply, fill #0

## 2021-07-24 ENCOUNTER — Telehealth: Payer: Self-pay

## 2021-07-24 NOTE — Telephone Encounter (Signed)
Received [place] [employer] ppw from patient. Completed by Dr Pearlean Brownie, per POD 2 - no charge. Paperwork was handed to patient in lobby.

## 2021-07-25 ENCOUNTER — Emergency Department (HOSPITAL_COMMUNITY): Payer: Self-pay

## 2021-07-25 ENCOUNTER — Encounter (HOSPITAL_COMMUNITY): Payer: Self-pay

## 2021-07-25 ENCOUNTER — Inpatient Hospital Stay (HOSPITAL_COMMUNITY)
Admission: EM | Admit: 2021-07-25 | Discharge: 2021-07-27 | DRG: 101 | Disposition: A | Discharge status: Home / Self Care | Payer: Self-pay | Attending: Internal Medicine | Admitting: Internal Medicine

## 2021-07-25 ENCOUNTER — Other Ambulatory Visit: Payer: Self-pay

## 2021-07-25 DIAGNOSIS — I428 Other cardiomyopathies: Secondary | ICD-10-CM | POA: Diagnosis present

## 2021-07-25 DIAGNOSIS — N179 Acute kidney failure, unspecified: Secondary | ICD-10-CM | POA: Diagnosis present

## 2021-07-25 DIAGNOSIS — E1169 Type 2 diabetes mellitus with other specified complication: Secondary | ICD-10-CM

## 2021-07-25 DIAGNOSIS — Z91148 Patient's other noncompliance with medication regimen for other reason: Secondary | ICD-10-CM

## 2021-07-25 DIAGNOSIS — E785 Hyperlipidemia, unspecified: Secondary | ICD-10-CM | POA: Diagnosis present

## 2021-07-25 DIAGNOSIS — I10 Essential (primary) hypertension: Secondary | ICD-10-CM | POA: Diagnosis present

## 2021-07-25 DIAGNOSIS — D631 Anemia in chronic kidney disease: Secondary | ICD-10-CM | POA: Diagnosis present

## 2021-07-25 DIAGNOSIS — G40909 Epilepsy, unspecified, not intractable, without status epilepticus: Principal | ICD-10-CM | POA: Diagnosis present

## 2021-07-25 DIAGNOSIS — G40919 Epilepsy, unspecified, intractable, without status epilepticus: Secondary | ICD-10-CM | POA: Diagnosis present

## 2021-07-25 DIAGNOSIS — I110 Hypertensive heart disease with heart failure: Secondary | ICD-10-CM | POA: Diagnosis present

## 2021-07-25 DIAGNOSIS — T4276XA Underdosing of unspecified antiepileptic and sedative-hypnotic drugs, initial encounter: Secondary | ICD-10-CM | POA: Diagnosis present

## 2021-07-25 DIAGNOSIS — E872 Acidosis, unspecified: Secondary | ICD-10-CM | POA: Diagnosis present

## 2021-07-25 DIAGNOSIS — E860 Dehydration: Secondary | ICD-10-CM | POA: Diagnosis present

## 2021-07-25 DIAGNOSIS — R32 Unspecified urinary incontinence: Secondary | ICD-10-CM | POA: Diagnosis present

## 2021-07-25 DIAGNOSIS — E119 Type 2 diabetes mellitus without complications: Secondary | ICD-10-CM | POA: Diagnosis present

## 2021-07-25 DIAGNOSIS — D649 Anemia, unspecified: Secondary | ICD-10-CM

## 2021-07-25 DIAGNOSIS — Z79899 Other long term (current) drug therapy: Secondary | ICD-10-CM

## 2021-07-25 DIAGNOSIS — R569 Unspecified convulsions: Secondary | ICD-10-CM

## 2021-07-25 DIAGNOSIS — I2510 Atherosclerotic heart disease of native coronary artery without angina pectoris: Secondary | ICD-10-CM | POA: Diagnosis present

## 2021-07-25 DIAGNOSIS — Z7984 Long term (current) use of oral hypoglycemic drugs: Secondary | ICD-10-CM

## 2021-07-25 DIAGNOSIS — I509 Heart failure, unspecified: Secondary | ICD-10-CM

## 2021-07-25 DIAGNOSIS — I5022 Chronic systolic (congestive) heart failure: Secondary | ICD-10-CM | POA: Diagnosis present

## 2021-07-25 DIAGNOSIS — Z8249 Family history of ischemic heart disease and other diseases of the circulatory system: Secondary | ICD-10-CM

## 2021-07-25 DIAGNOSIS — Z888 Allergy status to other drugs, medicaments and biological substances status: Secondary | ICD-10-CM

## 2021-07-25 DIAGNOSIS — Z87891 Personal history of nicotine dependence: Secondary | ICD-10-CM

## 2021-07-25 DIAGNOSIS — E2609 Other primary hyperaldosteronism: Secondary | ICD-10-CM | POA: Diagnosis present

## 2021-07-25 DIAGNOSIS — Z5902 Unsheltered homelessness: Secondary | ICD-10-CM

## 2021-07-25 LAB — CBC WITH DIFFERENTIAL/PLATELET
Abs Immature Granulocytes: 0.09 10*3/uL — ABNORMAL HIGH (ref 0.00–0.07)
Basophils Absolute: 0.1 10*3/uL (ref 0.0–0.1)
Basophils Relative: 1 %
Eosinophils Absolute: 0.1 10*3/uL (ref 0.0–0.5)
Eosinophils Relative: 3 %
HCT: 38.1 % — ABNORMAL LOW (ref 39.0–52.0)
Hemoglobin: 12 g/dL — ABNORMAL LOW (ref 13.0–17.0)
Immature Granulocytes: 2 %
Lymphocytes Relative: 47 %
Lymphs Abs: 2.4 10*3/uL (ref 0.7–4.0)
MCH: 26.9 pg (ref 26.0–34.0)
MCHC: 31.5 g/dL (ref 30.0–36.0)
MCV: 85.4 fL (ref 80.0–100.0)
Monocytes Absolute: 0.4 10*3/uL (ref 0.1–1.0)
Monocytes Relative: 9 %
Neutro Abs: 2 10*3/uL (ref 1.7–7.7)
Neutrophils Relative %: 38 %
Platelets: 219 10*3/uL (ref 150–400)
RBC: 4.46 MIL/uL (ref 4.22–5.81)
RDW: 13.8 % (ref 11.5–15.5)
WBC: 5.1 10*3/uL (ref 4.0–10.5)
nRBC: 0 % (ref 0.0–0.2)

## 2021-07-25 LAB — RAPID URINE DRUG SCREEN, HOSP PERFORMED
Amphetamines: NOT DETECTED
Barbiturates: NOT DETECTED
Benzodiazepines: NOT DETECTED
Cocaine: NOT DETECTED
Opiates: NOT DETECTED
Tetrahydrocannabinol: NOT DETECTED

## 2021-07-25 LAB — MAGNESIUM: Magnesium: 2.4 mg/dL (ref 1.7–2.4)

## 2021-07-25 LAB — URINALYSIS, ROUTINE W REFLEX MICROSCOPIC
Bilirubin Urine: NEGATIVE
Glucose, UA: NEGATIVE mg/dL
Ketones, ur: NEGATIVE mg/dL
Leukocytes,Ua: NEGATIVE
Nitrite: NEGATIVE
Protein, ur: 30 mg/dL — AB
Specific Gravity, Urine: 1.01 (ref 1.005–1.030)
pH: 5 (ref 5.0–8.0)

## 2021-07-25 LAB — COMPREHENSIVE METABOLIC PANEL
ALT: 12 U/L (ref 0–44)
AST: 18 U/L (ref 15–41)
Albumin: 3.8 g/dL (ref 3.5–5.0)
Alkaline Phosphatase: 49 U/L (ref 38–126)
Anion gap: 16 — ABNORMAL HIGH (ref 5–15)
BUN: 15 mg/dL (ref 6–20)
CO2: 14 mmol/L — ABNORMAL LOW (ref 22–32)
Calcium: 9 mg/dL (ref 8.9–10.3)
Chloride: 109 mmol/L (ref 98–111)
Creatinine, Ser: 1.73 mg/dL — ABNORMAL HIGH (ref 0.61–1.24)
GFR, Estimated: 48 mL/min — ABNORMAL LOW (ref >60)
Glucose, Bld: 117 mg/dL — ABNORMAL HIGH (ref 70–99)
Potassium: 4 mmol/L (ref 3.5–5.1)
Sodium: 139 mmol/L (ref 135–145)
Total Bilirubin: 0.5 mg/dL (ref 0.3–1.2)
Total Protein: 6.2 g/dL — ABNORMAL LOW (ref 6.5–8.1)

## 2021-07-25 LAB — BASIC METABOLIC PANEL
Anion gap: 10 (ref 5–15)
BUN: 12 mg/dL (ref 6–20)
CO2: 21 mmol/L — ABNORMAL LOW (ref 22–32)
Calcium: 9.1 mg/dL (ref 8.9–10.3)
Chloride: 108 mmol/L (ref 98–111)
Creatinine, Ser: 1.75 mg/dL — ABNORMAL HIGH (ref 0.61–1.24)
GFR, Estimated: 47 mL/min — ABNORMAL LOW (ref >60)
Glucose, Bld: 88 mg/dL (ref 70–99)
Potassium: 3.9 mmol/L (ref 3.5–5.1)
Sodium: 139 mmol/L (ref 135–145)

## 2021-07-25 LAB — CK: Total CK: 109 U/L (ref 49–397)

## 2021-07-25 LAB — TROPONIN I (HIGH SENSITIVITY)
Troponin I (High Sensitivity): 17 ng/L (ref <18)
Troponin I (High Sensitivity): 20 ng/L — ABNORMAL HIGH (ref <18)

## 2021-07-25 LAB — ETHANOL: Alcohol, Ethyl (B): <10 mg/dL (ref <10)

## 2021-07-25 LAB — CBG MONITORING, ED: Glucose-Capillary: 121 mg/dL — ABNORMAL HIGH (ref 70–99)

## 2021-07-25 MED ORDER — ACETAMINOPHEN 325 MG PO TABS: 650.0000 mg | ORAL_TABLET | Freq: Four times a day (QID) | ORAL | Status: DC | PRN

## 2021-07-25 MED ORDER — LEVETIRACETAM IN NACL 1000 MG/100ML IV SOLN
2000.0000 mg | Freq: Once | INTRAVENOUS | Status: AC
  Administered 2021-07-25: 2000 mg via INTRAVENOUS
  Filled 2021-07-25: qty 200

## 2021-07-25 MED ORDER — ACETAMINOPHEN 650 MG RE SUPP: 650.0000 mg | Freq: Four times a day (QID) | RECTAL | Status: DC | PRN

## 2021-07-25 MED ORDER — INSULIN ASPART 100 UNIT/ML IJ SOLN: 0.0000 [IU] | Freq: Every day | INTRAMUSCULAR | Status: DC

## 2021-07-25 MED ORDER — SODIUM CHLORIDE 0.9 % IV SOLN: INTRAVENOUS | Status: AC

## 2021-07-25 MED ORDER — LEVETIRACETAM IN NACL 1000 MG/100ML IV SOLN
1000.0000 mg | Freq: Once | INTRAVENOUS | Status: AC
  Administered 2021-07-25: 1000 mg via INTRAVENOUS
  Filled 2021-07-25: qty 100

## 2021-07-25 MED ORDER — LEVETIRACETAM 500 MG PO TABS
500.0000 mg | ORAL_TABLET | Freq: Two times a day (BID) | ORAL | Status: DC
  Administered 2021-07-26 – 2021-07-27 (×3): 500 mg via ORAL
  Filled 2021-07-25 (×3): qty 1

## 2021-07-25 MED ORDER — ENOXAPARIN SODIUM 40 MG/0.4ML IJ SOSY
40.0000 mg | PREFILLED_SYRINGE | INTRAMUSCULAR | Status: DC
Start: 2021-07-26 — End: 2021-07-27
  Administered 2021-07-26 – 2021-07-27 (×2): 40 mg via SUBCUTANEOUS
  Filled 2021-07-25 (×2): qty 0.4

## 2021-07-25 MED ORDER — LACTATED RINGERS IV BOLUS
1000.0000 mL | Freq: Once | INTRAVENOUS | Status: AC
  Administered 2021-07-25: 1000 mL via INTRAVENOUS

## 2021-07-25 MED ORDER — LORAZEPAM 2 MG/ML IJ SOLN
INTRAMUSCULAR | Status: AC
  Administered 2021-07-25: 2 mg
  Filled 2021-07-25: qty 1

## 2021-07-25 MED ORDER — INSULIN ASPART 100 UNIT/ML IJ SOLN: 0.0000 [IU] | Freq: Three times a day (TID) | INTRAMUSCULAR | Status: DC

## 2021-07-25 NOTE — H&P (Signed)
History and Physical    Steven Higgins TKW:[id number] DOB: November 16, 1970 DOA: 07/25/2021  PCP: Lavinia Sharps, NP  Patient coming from: Home  Chief Complaint: Syncope  HPI: Steven Higgins is a 51 y.o. male with medical history significant of seizure disorder, chronic systolic CHF, CAD, hypertension, history of medication noncompliance, history of intracranial bleed in 2018 secondary to malignant hypertension, hyperlipidemia, type 2 diabetes presented to the ED via EMS for loss of consciousness.  He was found unresponsive in a hot van, unknown how long he was there.  Upon EMS arrival, patient was confused and they placed him in a cool environment and was given 1 L fluid bolus.  Patient reported not taking his seizure medication for the past 1 week.  Upon arrival to the ED, patient was awake and alert.  His vital signs were stable with normal body temperature.  Shortly after arrival, he had a witnessed tonic-clonic seizure which lasted approximately 1 minute.  Placed on supplemental oxygen and was given 2 mg of Ativan and loaded with IV Keppra.  Neurology recommended IV Keppra 3 g loading dose and then going back to his usual dose of 500 mg twice daily.  Labs notable for WBC 5.1, hemoglobin 12.0, MCV 85.4, platelet count 219k.  Sodium 139, potassium 4.0, chloride 109, bicarb 14, anion gap 16, BUN 15, creatinine 1.7 (baseline 1.1-1.3), glucose 117.  LFTs normal.  High-sensitivity troponin 17 > 20.  CK normal.  Blood ethanol level undetectable.  UA not suggestive of infection.  UDS negative.  Magnesium normal.  CT head negative for acute finding. Patient was given 2 L LR boluses.  Repeat labs showing bicarb 21, anion gap 10, creatinine 1.7.  Patient states he is homeless and living in a [place].  He recalls sitting in his [name] which was off, waiting for someone outside a store and does not exactly know what happened.  He thinks he might have passed out..  States he is taking all of his home medications except ran  out of his seizure medication about 5 days ago.  Denies fevers, chills, cough, shortness of breath, chest pain, nausea, vomiting, or abdominal pain.  Not able to give any additional history.  Review of Systems:  Review of Systems  All other systems reviewed and are negative.   Past Medical History:  Diagnosis Date   CAD (coronary artery disease), native coronary artery 01/22/2017   coronary Ca score of 143 and mild nonobstructive plaque in the RCA that is tortuous with aneurysmal dilatation and minimal plaque in the LAD and LCx by coronary CTA 07/2016   Carotid artery stenosis 04/04/2016   1-39% bilateral by dopplers 03/2016   Diabetes mellitus (HCC)    Ectatic abdominal aorta (HCC)    a. f/u due 2024.   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 EF 40-45% by echo 03/2016 presumed due to HTN - nuc with no ischemia, nonobstructive CAD by cor CT 07/2016.   Primary hyperaldosteronism (HCC)    Renal artery stenosis (H[facility]) 04/04/2016   By renal dopplers 03/2016 1-59% but not seen on CT abd 02/2017   Seizure disorder (HCC)    as sequela of small intracerebral bleed    History reviewed. No pertinent surgical history.   reports that he has been smoking cigarettes. He has been smoking an average of .25 packs  per day. He has never used smokeless tobacco. He reports that he does not drink alcohol and does not use drugs.  Allergies  Allergen Reactions   Lisinopril Cough    Family History  Problem Relation Age of Onset   Diabetes Mother    Heart disease Mother    Hypertension Mother    Heart disease Father    Heart attack Father    Hypertension Father    Hypertension Maternal Grandmother    Diabetes Maternal Grandmother    Hypertension Paternal Grandmother     Prior to Admission medications   Medication Sig Start Date End Date Taking? Authorizing Provider   atorvastatin (LIPITOR) 20 MG tablet Take 1 tablet (20 mg total) by mouth every evening. 05/09/21 08/07/21  Orion Crook I, NP  carvedilol (COREG) 6.25 MG tablet Take 1 tablet (6.25 mg total) by mouth 2 (two) times daily with a meal. 06/19/21   Passmore, Enid Derry I, NP  hydrALAZINE (APRESOLINE) 25 MG tablet Take 1.5 tablets (37.5 mg total) by mouth 3 (three) times daily. Patient taking differently: Take 25 mg by mouth 3 (three) times daily. 01/11/21 07/19/21  Orion Crook I, NP  isosorbide mononitrate (IMDUR) 60 MG 24 hr tablet Take 1 tablet (60 mg total) by mouth daily. 01/11/21 07/22/21  Orion Crook I, NP  levETIRAcetam (KEPPRA) 500 MG tablet Take 1 tablet (500 mg total) by mouth 2 (two) times daily. 02/23/21 07/19/21  Micki Riley, MD  metFORMIN (GLUCOPHAGE) 500 MG tablet Take 1 tablet (500 mg total) by mouth 2 (two) times daily with a meal. 01/11/21   Passmore, Enid Derry I, NP  nicotine (NICODERM CQ - DOSED IN MG/24 HOURS) 21 mg/24hr patch Place 1 patch (21 mg total) onto the skin daily. 05/09/21   Orion Crook I, NP    Physical Exam: Vitals:   07/25/21 2000 07/25/21 2030 07/25/21 2108 07/25/21 2200  BP: (!) 157/87 (!) 153/93  (!) 151/90  Pulse: 86 86  78  Resp: 19 19  14   Temp:   98.9 F (37.2 C)   TempSrc:   Oral   SpO2: 98% 98%  100%  Weight:      Height:        Physical Exam Vitals reviewed.  Constitutional:      General: He is not in acute distress. HENT:     Head: Normocephalic and atraumatic.  Eyes:     Extraocular Movements: Extraocular movements intact.  Cardiovascular:     Rate and Rhythm: Normal rate and regular rhythm.     Pulses: Normal pulses.  Pulmonary:     Effort: Pulmonary effort is normal. No respiratory distress.     Breath sounds: Normal breath sounds. No wheezing or rales.  Abdominal:     General: Bowel sounds are normal. There is no distension.     Palpations: Abdomen is soft.     Tenderness: There is no abdominal tenderness.  Musculoskeletal:         General: No swelling or tenderness.     Cervical back: Normal range of motion.  Skin:    General: Skin is warm and dry.  Neurological:     General: No focal deficit present.     Mental Status: He is alert and oriented to person, place, and time.      Labs on Admission: I have personally reviewed following labs and imaging studies  CBC: Recent Labs  Lab 07/25/21 1730  WBC 5.1  NEUTROABS 2.0  HGB 12.0*  HCT 38.1*  MCV  85.4  PLT 219   Basic Metabolic Panel: Recent Labs  Lab 07/25/21 1730 07/25/21 2022  NA 139 139  K 4.0 3.9  CL 109 108  CO2 14* 21*  GLUCOSE 117* 88  BUN 15 12  CREATININE 1.73* 1.75*  CALCIUM 9.0 9.1  MG  --  2.4   GFR: Estimated Creatinine Clearance: 67.7 mL/min (A) (by C-G formula based on SCr of 1.75 mg/dL (H)). Liver Function Tests: Recent Labs  Lab 07/25/21 1730  AST 18  ALT 12  ALKPHOS 49  BILITOT 0.5  PROT 6.2*  ALBUMIN 3.8   No results for input(s): "LIPASE", "AMYLASE" in the last 168 hours. No results for input(s): "AMMONIA" in the last 168 hours. Coagulation Profile: No results for input(s): "INR", "PROTIME" in the last 168 hours. Cardiac Enzymes: Recent Labs  Lab 07/25/21 1730  CKTOTAL 109   BNP (last 3 results) No results for input(s): "PROBNP" in the last 8760 hours. HbA1C: No results for input(s): "HGBA1C" in the last 72 hours. CBG: Recent Labs  Lab 07/25/21 1751  GLUCAP 121*   Lipid Profile: No results for input(s): "CHOL", "HDL", "LDLCALC", "TRIG", "CHOLHDL", "LDLDIRECT" in the last 72 hours. Thyroid Function Tests: No results for input(s): "TSH", "T4TOTAL", "FREET4", "T3FREE", "THYROIDAB" in the last 72 hours. Anemia Panel: No results for input(s): "VITAMINB12", "FOLATE", "FERRITIN", "TIBC", "IRON", "RETICCTPCT" in the last 72 hours. Urine analysis:    Component Value Date/Time   COLORURINE STRAW (A) 07/25/2021 1740   APPEARANCEUR CLEAR 07/25/2021 1740   LABSPEC 1.010 07/25/2021 1740   PHURINE 5.0  07/25/2021 1740   GLUCOSEU NEGATIVE 07/25/2021 1740   HGBUR SMALL (A) 07/25/2021 1740   BILIRUBINUR NEGATIVE 07/25/2021 1740   BILIRUBINUR negative 01/11/2021 1140   KETONESUR NEGATIVE 07/25/2021 1740   PROTEINUR 30 (A) 07/25/2021 1740   UROBILINOGEN 0.2 01/11/2021 1140   NITRITE NEGATIVE 07/25/2021 1740   LEUKOCYTESUR NEGATIVE 07/25/2021 1740    Radiological Exams on Admission: I have personally reviewed images CT Head Wo Contrast  Result Date: 07/25/2021 CLINICAL DATA:  Mental status change, heat exposure EXAM: CT HEAD WITHOUT CONTRAST TECHNIQUE: Contiguous axial images were obtained from the base of the skull through the vertex without intravenous contrast. RADIATION DOSE REDUCTION: This exam was performed according to the departmental dose-optimization program which includes automated exposure control, adjustment of the mA and/or kV according to patient size and/or use of iterative reconstruction technique. COMPARISON:  02/07/2021 FINDINGS: Brain: No evidence of acute infarction, hemorrhage, mass, mass effect, or midline shift. No hydrocephalus or extra-axial fluid collection. Redemonstrated moderate volume encephalomalacia in the right frontal lobe. Lacunar infarct in the left lentiform nucleus. Vascular: No hyperdense vessel. Skull: Normal. Negative for fracture or focal lesion. Sinuses/Orbits: Small mucous retention cyst in the posterior right ethmoid air cells. The orbits are unremarkable. Other: The mastoid air cells are well aerated. IMPRESSION: No acute intracranial process. Electronically Signed   By: Wiliam Ke M.D.   On: 07/25/2021 19:59    EKG: Independently reviewed.  Sinus rhythm, T wave inversions in inferolateral leads seen on prior tracings as well.  Assessment and Plan  Breakthrough seizures Due to medication nonadherence.  Patient likely had a seizure in his [name] as he had another witnessed tonic-clonic seizure in the ED.  He was given Ativan and neurology recommended  loading dose IV Keppra 3 g followed by his usual home dose of Keppra 500 mg twice daily.  CT head negative for acute finding. -Continue Keppra -Seizure precautions  AKI High anion  gap metabolic acidosis AKI likely due to dehydration.  Metabolic acidosis improved on repeat labs. -Continue IV fluid hydration -Monitor BMP -Avoid nephrotoxic agents  Chronic systolic CHF Echo done in 2018 showing EF 40 to 45%.  Appears dry at this time. -Continue IV fluid hydration given AKI and monitor volume status closely  CAD ACS less likely as troponin minimally elevated (17 >20), EKG without acute ischemic changes, and patient is not endorsing chest pain.  Hypertension -Continue home meds after pharmacy med rec is done.  Hyperlipidemia -Continue statin after pharmacy med rec is done.  Type 2 diabetes A1c 6.1 on 05/09/2021. -Sensitive sliding scale insulin -Hold metformin  Mild normocytic anemia Not endorsing any symptoms of bleeding. -Continue to monitor  DVT prophylaxis: Lovenox Code Status: Full Code Family Communication: No family available at this time. Level of care: Progressive Care Unit Admission status: It is my clinical opinion that referral for OBSERVATION is reasonable and necessary in this patient based on the above information provided. The aforementioned taken together are felt to place the patient at high risk for further clinical deterioration. However, it is anticipated that the patient may be medically stable for discharge from the hospital within 24 to 48 hours.   John Giovanni MD [facility]  If 7PM-7AM, please contact night-coverage [email or web address]  07/25/2021, 10:58 PM

## 2021-07-25 NOTE — ED Notes (Signed)
Patient transported to CT 

## 2021-07-25 NOTE — ED Provider Notes (Signed)
[facility] EMERGENCY DEPARTMENT Provider Note   CSN: [id number] Arrival date & time: 07/25/21  1725     History  Chief Complaint  Patient presents with   Heat Exposure    Steven Higgins is a 51 y.o. male.  Patient with a history of diabetes, nonischemic cardiomyopathy, CAD, seizure disorder, hypertensive heart disease presenting with loss of consciousness.  He was found unresponsive in his [name] outside of a store.  He states he was waiting for a friend to come out of the store and does not know how long he was there.  On EMS arrival the patient was confused until they placed him in a cool environment.  Patient states he is homeless currently and living in his [name].  H has a seizure disorder but has not had his medication for the past 1 week.  States he is taking his other medications.  Denies any alcohol or drug use.  Does not know if he had a seizure today and was incontinent of urine. Does not know if he had a fever.  Denies any chest pain or shortness of breath.  Denies any runny nose, sore throat, cough, fever, vomiting, pain with urination or blood in the urine. He denies any focal weakness, numbness or tingling.  Complains of a headache that onset gradually after EMS found him.  The history is provided by the patient and the EMS personnel.       Home Medications Prior to Admission medications   Medication Sig Start Date End Date Taking? Authorizing Provider  atorvastatin (LIPITOR) 20 MG tablet Take 1 tablet (20 mg total) by mouth every evening. 05/09/21 08/07/21  Orion Crook I, NP  carvedilol (COREG) 6.25 MG tablet Take 1 tablet (6.25 mg total) by mouth 2 (two) times daily with a meal. 06/19/21   Passmore, Enid Derry I, NP  hydrALAZINE (APRESOLINE) 25 MG tablet Take 1.5 tablets (37.5 mg total) by mouth 3 (three) times daily. Patient taking differently: Take 25 mg by mouth 3 (three) times daily. 01/11/21 07/19/21  Orion Crook I, NP  isosorbide mononitrate (IMDUR)  60 MG 24 hr tablet Take 1 tablet (60 mg total) by mouth daily. 01/11/21 07/22/21  Orion Crook I, NP  levETIRAcetam (KEPPRA) 500 MG tablet Take 1 tablet (500 mg total) by mouth 2 (two) times daily. 02/23/21 07/19/21  Micki Riley, MD  metFORMIN (GLUCOPHAGE) 500 MG tablet Take 1 tablet (500 mg total) by mouth 2 (two) times daily with a meal. 01/11/21   Passmore, Enid Derry I, NP  nicotine (NICODERM CQ - DOSED IN MG/24 HOURS) 21 mg/24hr patch Place 1 patch (21 mg total) onto the skin daily. 05/09/21   Orion Crook I, NP      Allergies    Lisinopril    Review of Systems   Review of Systems  Constitutional:  Negative for activity change, appetite change and fever.  Neurological:  Positive for seizures and headaches.   all other systems are negative except as noted in the HPI and PMH.    Physical Exam Updated Vital Signs BP 127/84 (BP Location: Right Arm)   Pulse 82   Temp 98.1 F (36.7 C) (Oral)   Resp 20   Ht 6\' 7"  (2.007 m)   Wt 94.3 kg   BMI 23.43 kg/m  Physical Exam Vitals and nursing note reviewed.  Constitutional:      General: He is not in acute distress.    Appearance: He is well-developed.  HENT:     Head:  Normocephalic and atraumatic.     Mouth/Throat:     Pharynx: No oropharyngeal exudate.     Comments: No tongue trauma Eyes:     Conjunctiva/sclera: Conjunctivae normal.     Pupils: Pupils are equal, round, and reactive to light.  Neck:     Comments: No meningismus. Cardiovascular:     Rate and Rhythm: Normal rate and regular rhythm.     Heart sounds: Normal heart sounds. No murmur heard. Pulmonary:     Effort: Pulmonary effort is normal. No respiratory distress.     Breath sounds: Normal breath sounds.  Chest:     Chest wall: No tenderness.  Abdominal:     Palpations: Abdomen is soft.     Tenderness: There is no abdominal tenderness. There is no guarding or rebound.  Genitourinary:    Comments: Incontinent of urine Musculoskeletal:        General: No  tenderness. Normal range of motion.     Cervical back: Normal range of motion and neck supple.  Skin:    General: Skin is warm.  Neurological:     Mental Status: He is alert and oriented to person, place, and time.     Cranial Nerves: No cranial nerve deficit.     Motor: No abnormal muscle tone.     Coordination: Coordination normal.     Comments:  5/5 strength throughout. CN 2-12 intact.Equal grip strength.   Psychiatric:        Behavior: Behavior normal.     ED Results / Procedures / Treatments   Labs (all labs ordered are listed, but only abnormal results are displayed) Labs Reviewed  CBC WITH DIFFERENTIAL/PLATELET - Abnormal; Notable for the following components:      Result Value   Hemoglobin 12.0 (*)    HCT 38.1 (*)    Abs Immature Granulocytes 0.09 (*)    All other components within normal limits  COMPREHENSIVE METABOLIC PANEL - Abnormal; Notable for the following components:   CO2 14 (*)    Glucose, Bld 117 (*)    Creatinine, Ser 1.73 (*)    Total Protein 6.2 (*)    GFR, Estimated 48 (*)    Anion gap 16 (*)    All other components within normal limits  URINALYSIS, ROUTINE W REFLEX MICROSCOPIC - Abnormal; Notable for the following components:   Color, Urine STRAW (*)    Hgb urine dipstick SMALL (*)    Protein, ur 30 (*)    Bacteria, UA RARE (*)    All other components within normal limits  BASIC METABOLIC PANEL - Abnormal; Notable for the following components:   CO2 21 (*)    Creatinine, Ser 1.75 (*)    GFR, Estimated 47 (*)    All other components within normal limits  CBG MONITORING, ED - Abnormal; Notable for the following components:   Glucose-Capillary 121 (*)    All other components within normal limits  TROPONIN I (HIGH SENSITIVITY) - Abnormal; Notable for the following components:   Troponin I (High Sensitivity) 20 (*)    All other components within normal limits  CK  RAPID URINE DRUG SCREEN, HOSP PERFORMED  ETHANOL  MAGNESIUM  TROPONIN I (HIGH  SENSITIVITY)    EKG EKG Interpretation  Date/Time:  Tuesday July 25 2021 17:30:45 EDT Ventricular Rate:  81 PR Interval:  196 QRS Duration: 110 QT Interval:  376 QTC Calculation: 437 R Axis:   42 Text Interpretation: Sinus rhythm Probable left atrial enlargement Abnormal R-wave progression, early transition  Nonspecific T abnormalities, diffuse leads Nonspecific T wave abnormality Confirmed by Glynn Octave [phone]) on 07/25/2021 5:52:31 PM  Radiology CT Head Wo Contrast  Result Date: 07/25/2021 CLINICAL DATA:  Mental status change, heat exposure EXAM: CT HEAD WITHOUT CONTRAST TECHNIQUE: Contiguous axial images were obtained from the base of the skull through the vertex without intravenous contrast. RADIATION DOSE REDUCTION: This exam was performed according to the departmental dose-optimization program which includes automated exposure control, adjustment of the mA and/or kV according to patient size and/or use of iterative reconstruction technique. COMPARISON:  02/07/2021 FINDINGS: Brain: No evidence of acute infarction, hemorrhage, mass, mass effect, or midline shift. No hydrocephalus or extra-axial fluid collection. Redemonstrated moderate volume encephalomalacia in the right frontal lobe. Lacunar infarct in the left lentiform nucleus. Vascular: No hyperdense vessel. Skull: Normal. Negative for fracture or focal lesion. Sinuses/Orbits: Small mucous retention cyst in the posterior right ethmoid air cells. The orbits are unremarkable. Other: The mastoid air cells are well aerated. IMPRESSION: No acute intracranial process. Electronically Signed   By: Wiliam Ke M.D.   On: 07/25/2021 19:59    Procedures .Critical Care  Performed by: Glynn Octave, MD Authorized by: Glynn Octave, MD   Critical care provider statement:    Critical care time (minutes):  45   Critical care time was exclusive of:  Separately billable procedures and treating other patients   Critical care was  necessary to treat or prevent imminent or life-threatening deterioration of the following conditions:  CNS failure or compromise   Critical care was time spent personally by me on the following activities:  Development of treatment plan with patient or surrogate, discussions with consultants, evaluation of patient's response to treatment, examination of patient, ordering and review of laboratory studies, ordering and review of radiographic studies, ordering and performing treatments and interventions, pulse oximetry, re-evaluation of patient's condition and review of old charts   I assumed direction of critical care for this patient from another provider in my specialty: no     Care discussed with: admitting provider       Medications Ordered in ED Medications  levETIRAcetam (KEPPRA) IVPB 1000 mg/100 mL premix (has no administration in time range)  lactated ringers bolus 1,000 mL (has no administration in time range)  LORazepam (ATIVAN) 2 MG/ML injection (has no administration in time range)    ED Course/ Medical Decision Making/ A&P                           Medical Decision Making Amount and/or Complexity of Data Reviewed Labs: ordered. Decision-making details documented in ED Course. Radiology: ordered and independent interpretation performed. Decision-making details documented in ED Course. ECG/medicine tests: ordered and independent interpretation performed. Decision-making details documented in ED Course.  Risk Prescription drug management. Decision regarding hospitalization.   Patient found unresponsive in a hot van, unknown how long he was there.  On arrival he is awake and alert and answering questions appropriately.  Reports not having his seizure medications for about 1 week.  Vitals are stable, no distress.  We will hydrate, check labs, check EKG.  EKG does show some T wave inversions laterally which are new.  Shortly after arrival, patient had a witnessed tonic seizure  lasting for approximately 1 minute.  He was given 2 mg of Ativan and loaded with IV Keppra.  Labs reassuring with mild AKI.  Patient given IV fluids.  Metabolic acidosis likely secondary to seizure activity.  Discussed with Dr.  Lindzen of neurology.  He recommends loading patient with a total of 3 g of Keppra and then going back to 500 twice daily.  Metabolic acidosis is improved after IV fluids.  Patient wakes up briefly but remains quite somnolent and not able to walk or ambulate.  Given history of likely several seizures today will plan observation admission overnight. D/w Dr. Loney Loh.         Final Clinical Impression(s) / ED Diagnoses Final diagnoses:  None    Rx / DC Orders ED Discharge Orders     None         Reniya Mcclees, Jeannett Senior, MD 07/25/21 2251

## 2021-07-25 NOTE — ED Notes (Signed)
Lab called to add on Mg.  

## 2021-07-25 NOTE — ED Notes (Addendum)
This RN entered room, pt was A&O. Pt then began shaking motion and stopped responding last approx. 1 minute. MD Rancour to bedside, verbal for 2mg  of Ativan IV. NRB mask placed on pt.

## 2021-07-25 NOTE — ED Triage Notes (Signed)
Pt BIB [facility]. Pt had a syncopal episode after being out in the heat all day. EMS report pt was confused until after they placed pt in a cool environment. EMS began active cooling measures and admin 1L of NaCl IV.

## 2021-07-26 DIAGNOSIS — E785 Hyperlipidemia, unspecified: Secondary | ICD-10-CM

## 2021-07-26 DIAGNOSIS — I509 Heart failure, unspecified: Secondary | ICD-10-CM

## 2021-07-26 DIAGNOSIS — E872 Acidosis, unspecified: Secondary | ICD-10-CM

## 2021-07-26 DIAGNOSIS — N179 Acute kidney failure, unspecified: Secondary | ICD-10-CM

## 2021-07-26 DIAGNOSIS — G40919 Epilepsy, unspecified, intractable, without status epilepticus: Secondary | ICD-10-CM | POA: Diagnosis present

## 2021-07-26 DIAGNOSIS — D649 Anemia, unspecified: Secondary | ICD-10-CM

## 2021-07-26 DIAGNOSIS — I10 Essential (primary) hypertension: Secondary | ICD-10-CM

## 2021-07-26 LAB — CBC
HCT: 36.7 % — ABNORMAL LOW (ref 39.0–52.0)
Hemoglobin: 12.2 g/dL — ABNORMAL LOW (ref 13.0–17.0)
MCH: 27.4 pg (ref 26.0–34.0)
MCHC: 33.2 g/dL (ref 30.0–36.0)
MCV: 82.3 fL (ref 80.0–100.0)
Platelets: 202 10*3/uL (ref 150–400)
RBC: 4.46 MIL/uL (ref 4.22–5.81)
RDW: 13.8 % (ref 11.5–15.5)
WBC: 8.5 10*3/uL (ref 4.0–10.5)
nRBC: 0 % (ref 0.0–0.2)

## 2021-07-26 LAB — CBG MONITORING, ED
Glucose-Capillary: 107 mg/dL — ABNORMAL HIGH (ref 70–99)
Glucose-Capillary: 93 mg/dL (ref 70–99)
Glucose-Capillary: 99 mg/dL (ref 70–99)

## 2021-07-26 LAB — GLUCOSE, CAPILLARY
Glucose-Capillary: 110 mg/dL — ABNORMAL HIGH (ref 70–99)
Glucose-Capillary: 106 mg/dL — ABNORMAL HIGH (ref 70–99)

## 2021-07-26 MED ORDER — ISOSORBIDE MONONITRATE ER 30 MG PO TB24: 60.0000 mg | ORAL_TABLET | Freq: Every day | ORAL | Status: DC

## 2021-07-26 MED ORDER — CARVEDILOL 6.25 MG PO TABS
6.2500 mg | ORAL_TABLET | Freq: Two times a day (BID) | ORAL | Status: DC
  Administered 2021-07-26 – 2021-07-27 (×3): 6.25 mg via ORAL
  Filled 2021-07-26: qty 2
  Filled 2021-07-26 (×2): qty 1

## 2021-07-26 MED ORDER — ATORVASTATIN CALCIUM 10 MG PO TABS
20.0000 mg | ORAL_TABLET | Freq: Every evening | ORAL | Status: DC
  Administered 2021-07-26: 20 mg via ORAL
  Filled 2021-07-26: qty 2

## 2021-07-26 NOTE — ED Notes (Signed)
Breakfast order placed ?

## 2021-07-26 NOTE — Progress Notes (Signed)
[facility] PROGRESS NOTE    Progress Note  Steven Higgins  TGG:[id number] DOB: [birth year] DOA: 07/25/2021 PCP: Lavinia Sharps, NP     Brief Narrative:   Steven Higgins is an 51 y.o. 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  He was fluid resuscitated in the ED, shortly after arrival he had a witnessed tonic clonic seizures that lasted about a minute was given Ativan and loaded with Keppra    Assessment/Plan:   Breakthrough Seizures (HCC) Patient likely seizure in his [name], he does have history of noncompliance.  He does admit to noncompliance with the medication for over a week. Loaded with Keppra in the ED. CT of the head showed no acute findings. Continue seizure precaution.  Acute kidney injury/high anion gap metabolic acidosis: With a baseline creatinine of 1.2-1.4 on admission 1.7, anion gap of 16. Likely multifactorial in the setting of dehydration and seizure and gap has resolved with fluid resuscitation. Creatinine continues to be 1.7 we will continue IV fluids for the next 12 hours and will allow oral hydration.  Chronic systolic heart failure: With an echo in 2018 showed an EF of 40%. He appears dry on admission continue IV fluids recheck basic metabolic panel in the morning.  CAD: Denies any chest pain or shortness of breath twelve-lead EKG showed no signs of ischemia.  Essential hypertension: Resume Coreg allow a certain degree of permissive hypertension due to acute kidney injury. According to the chart he has an allergy to lisinopril with a cough we will discuss about an ARB.  Diabetes mellitus type 2: With an A1c of 6.1 in May 2023. Hold metformin due to acute kidney injury continue  sliding scale insulin along carb modified diet.  Hyperlipidemia: Continue statins.  Mild normocytic anemia: We will need a colonoscopy as an outpatient.  DVT prophylaxis: lovenox Family Communication:none Status is: Observation The patient remains OBS appropriate and will d/c before 2 midnights.    Code Status:     Code Status Orders  (From admission, onward)           Start     Ordered   07/25/21 2357  Full code  Continuous        07/25/21 2358           Code Status History     Date Active Date Inactive Code Status Order ID Comments User Context   11/19/2020 1730 11/21/2020 1804 Full Code [id number]  Emeline General, MD ED   03/16/2016 0138 03/20/2016 1955 Full Code [id number]  Rejeana Brock, MD Inpatient         IV Access:   Peripheral IV   Procedures and diagnostic studies:   CT Head Wo Contrast  Result Date: 07/25/2021 CLINICAL DATA:  Mental status change, heat exposure EXAM: CT HEAD WITHOUT CONTRAST TECHNIQUE: Contiguous axial images were obtained from the base of the skull through the vertex without intravenous contrast. RADIATION DOSE REDUCTION: This exam was performed according to the departmental dose-optimization program which includes automated exposure control, adjustment of the mA and/or kV according to patient size and/or use of iterative reconstruction technique. COMPARISON:  02/07/2021 FINDINGS: Brain: No evidence of acute infarction, hemorrhage, mass, mass effect, or midline shift. No hydrocephalus or extra-axial fluid collection. Redemonstrated moderate volume  encephalomalacia in the right frontal lobe. Lacunar infarct in the left lentiform nucleus. Vascular: No hyperdense vessel. Skull: Normal. Negative for fracture or focal lesion. Sinuses/Orbits: Small mucous retention cyst in the posterior right ethmoid air cells. The orbits are unremarkable. Other: The mastoid air cells are well aerated. IMPRESSION: No acute intracranial process.  Electronically Signed   By: Wiliam Ke M.D.   On: 07/25/2021 19:59     Medical Consultants:   None.   Subjective:    Steven Higgins relates no complaints slept well has not taking his medication over a week.  Objective:    Vitals:   07/26/21 0207 07/26/21 0215 07/26/21 0509 07/26/21 0545  BP:   (!) 150/103 (!) 168/90  Pulse:  62 68 65  Resp:  11 14 14   Temp: 98.6 F (37 C)  97.9 F (36.6 C)   TempSrc:   Oral   SpO2:  100% 95% 95%  Weight:      Height:       SpO2: 95 % O2 Flow Rate (L/min): 2 L/min   Intake/Output Summary (Last 24 hours) at 07/26/2021 0653 Last data filed at 07/25/2021 2030 Gross per 24 hour  Intake 2000 ml  Output --  Net 2000 ml   Filed Weights   07/25/21 1730  Weight: 94.3 kg    Exam: General exam: In no acute distress. Respiratory system: Good air movement and clear to auscultation. Cardiovascular system: S1 & S2 heard, RRR. No JVD.  Gastrointestinal system: Abdomen is nondistended, soft and nontender.  Extremities: No pedal edema. Skin: No rashes, lesions or ulcers Psychiatry: Judgement and insight appear normal. Mood & affect appropriate.    Data Reviewed:    Labs: Basic Metabolic Panel: Recent Labs  Lab 07/25/21 1730 07/25/21 2022  NA 139 139  K 4.0 3.9  CL 109 108  CO2 14* 21*  GLUCOSE 117* 88  BUN 15 12  CREATININE 1.73* 1.75*  CALCIUM 9.0 9.1  MG  --  2.4   GFR Estimated Creatinine Clearance: 67.7 mL/min (A) (by C-G formula based on SCr of 1.75 mg/dL (H)). Liver Function Tests: Recent Labs  Lab 07/25/21 1730  AST 18  ALT 12  ALKPHOS 49  BILITOT 0.5  PROT 6.2*  ALBUMIN 3.8   No results for input(s): "LIPASE", "AMYLASE" in the last 168 hours. No results for input(s): "AMMONIA" in the last 168 hours. Coagulation profile No results for input(s): "INR", "PROTIME" in the last 168 hours. COVID-19 Labs  No results for input(s): "DDIMER", "FERRITIN", "LDH", "CRP" in the last 72 hours.  Lab Results   Component Value Date   SARSCOV2NAA NEGATIVE 11/19/2020    CBC: Recent Labs  Lab 07/25/21 1730 07/26/21 0200  WBC 5.1 8.5  NEUTROABS 2.0  --   HGB 12.0* 12.2*  HCT 38.1* 36.7*  MCV 85.4 82.3  PLT 219 202   Cardiac Enzymes: Recent Labs  Lab 07/25/21 1730  CKTOTAL 109   BNP (last 3 results) No results for input(s): "PROBNP" in the last 8760 hours. CBG: Recent Labs  Lab 07/25/21 1751 07/26/21 0155  GLUCAP 121* 107*   D-Dimer: No results for input(s): "DDIMER" in the last 72 hours. Hgb A1c: No results for input(s): "HGBA1C" in the last 72 hours. Lipid Profile: No results for input(s): "CHOL", "HDL", "LDLCALC", "TRIG", "CHOLHDL", "LDLDIRECT" in the last 72 hours. Thyroid function studies: No results for input(s): "TSH", "T4TOTAL", "T3FREE", "THYROIDAB" in the last 72 hours.  Invalid input(s): "FREET3" Anemia work up: No results for  input(s): "VITAMINB12", "FOLATE", "FERRITIN", "TIBC", "IRON", "RETICCTPCT" in the last 72 hours. Sepsis Labs: Recent Labs  Lab 07/25/21 1730 07/26/21 0200  WBC 5.1 8.5   Microbiology No results found for this or any previous visit (from the past 240 hour(s)).   Medications:    enoxaparin (LOVENOX) injection  40 mg Subcutaneous Q24H   insulin aspart  0-5 Units Subcutaneous QHS   insulin aspart  0-9 Units Subcutaneous TID WC   levETIRAcetam  500 mg Oral BID   Continuous Infusions:  sodium chloride 125 mL/hr at 07/26/21 0156      LOS: 0 days   Charlynne Cousins  [facility]  07/26/2021, 6:53 AM

## 2021-07-26 NOTE — ED Notes (Signed)
NT provided pt with urinal and beverage

## 2021-07-26 NOTE — ED Notes (Signed)
Pt eating breakfast 

## 2021-07-26 NOTE — ED Notes (Signed)
Pt soiled his gown and linens. RN assisted pt with new gown and linens.

## 2021-07-27 ENCOUNTER — Other Ambulatory Visit (HOSPITAL_COMMUNITY): Payer: Self-pay

## 2021-07-27 DIAGNOSIS — G40919 Epilepsy, unspecified, intractable, without status epilepticus: Secondary | ICD-10-CM

## 2021-07-27 LAB — BASIC METABOLIC PANEL
Anion gap: 9 (ref 5–15)
BUN: 6 mg/dL (ref 6–20)
CO2: 23 mmol/L (ref 22–32)
Calcium: 8.9 mg/dL (ref 8.9–10.3)
Chloride: 106 mmol/L (ref 98–111)
Creatinine, Ser: 1.27 mg/dL — ABNORMAL HIGH (ref 0.61–1.24)
GFR, Estimated: >60 mL/min (ref >60)
Glucose, Bld: 98 mg/dL (ref 70–99)
Potassium: 3.2 mmol/L — ABNORMAL LOW (ref 3.5–5.1)
Sodium: 138 mmol/L (ref 135–145)

## 2021-07-27 LAB — GLUCOSE, CAPILLARY: Glucose-Capillary: 96 mg/dL (ref 70–99)

## 2021-07-27 MED ORDER — POTASSIUM CHLORIDE CRYS ER 20 MEQ PO TBCR
40.0000 meq | EXTENDED_RELEASE_TABLET | Freq: Two times a day (BID) | ORAL | Status: DC
  Administered 2021-07-27: 40 meq via ORAL
  Filled 2021-07-27: qty 2

## 2021-07-27 MED ORDER — ATORVASTATIN CALCIUM 20 MG PO TABS
20.0000 mg | ORAL_TABLET | Freq: Every evening | ORAL | 2 refills | Status: DC
  Filled 2021-07-27: qty 30, 30d supply, fill #0

## 2021-07-27 MED ORDER — METFORMIN HCL 500 MG PO TABS
500.0000 mg | ORAL_TABLET | Freq: Two times a day (BID) | ORAL | 3 refills | Status: DC
  Filled 2021-07-27: qty 60, 30d supply, fill #0

## 2021-07-27 MED ORDER — CARVEDILOL 6.25 MG PO TABS
6.2500 mg | ORAL_TABLET | Freq: Two times a day (BID) | ORAL | 1 refills | Status: DC
  Filled 2021-07-27: qty 60, 30d supply, fill #0
  Filled 2021-11-06: qty 60, 30d supply, fill #1

## 2021-07-27 MED ORDER — LEVETIRACETAM 500 MG PO TABS
500.0000 mg | ORAL_TABLET | Freq: Two times a day (BID) | ORAL | 5 refills | Status: DC
  Filled 2021-07-27: qty 60, 30d supply, fill #0

## 2021-07-27 MED ORDER — HYDRALAZINE HCL 25 MG PO TABS
37.5000 mg | ORAL_TABLET | Freq: Three times a day (TID) | ORAL | 5 refills | Status: DC
  Filled 2021-07-27: qty 135, 30d supply, fill #0
  Filled 2021-11-06: qty 135, 30d supply, fill #1

## 2021-07-27 NOTE — Discharge Summary (Signed)
Physician Discharge Summary  Steven Higgins LKG:[id number] DOB: 01/15/1970 DOA: 07/25/2021  PCP: Lavinia Sharps, NP  Admit date: 07/25/2021 Discharge date: 07/27/2021  Admitted From: Home Disposition:  Home  Recommendations for Outpatient Follow-up:  Follow up with PCP in 1-2 weeks, will need a colonoscopy as an outpatient. Please obtain BMP/CBC in one week   Home Health:no Equipment/Devices:None  Discharge Condition:Stable CODE STATUS:Full Diet recommendation: Heart Healthy  Brief/Interim Summary: 51 y.o. 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  He was fluid resuscitated in the ED, shortly after arrival he had a witnessed tonic clonic seizures that lasted about a minute was given Ativan and loaded with Keppra  Discharge Diagnoses:  Principal Problem:   Seizures (HCC) Active Problems:   Hyperlipidemia LDL goal <70   Hypertension   CAD (coronary artery disease), native coronary artery   AKI (acute kidney injury) (HCC)   CHF (congestive heart failure) (HCC)   Normocytic anemia   Breakthrough seizure (HCC)  Breakthrough seizures: Likely due to noncompliance he was started on IV Ativan and loaded with Keppra CT of the head showed no acute findings he was resumed on his regular dose of antiepileptic drug and had no further seizures. This prescription was sent to the [facility] pharmacy and they were brought to bedside.   We will follow-up with his primary care doctor as an outpatient.  Acute kidney injury/high anion gap metabolic acidosis: With a baseline creatinine like around 1.2 on admission 1.7 with an anion gap of 16 likely prerenal azotemia he was fluid resuscitated creatinine returned to  baseline.  Chronic systolic heart failure: Last 2D echo in 2019 with an EF of 40%. He was dry on admission heart failure medications were held he will resume them as an outpatient.  CAD: Noted he remained asymptomatic.  Essential hypertension: Initially his blood pressure was stable but started to rise as he was fluid resuscitated. He will resume his antihypertensive medications as an outpatient no changes made to his medication.  Diabetes mellitus type 2 without complications: With an A1c of 6.1 in May 2023. We will resume his metformin as an outpatient.  Hyperlipidemia: Sign continue statins.  Mild normocytic anemia: We will need a colonoscopy as an outpatient.   Discharge Instructions  Discharge Instructions     Diet - low sodium heart healthy   Complete by: As directed    Increase activity slowly   Complete by: As directed       Allergies as of 07/27/2021       Reactions   Lisinopril Cough        Medication List     TAKE these medications    atorvastatin 20 MG tablet Commonly known as: LIPITOR Take 1 tablet (20 mg total) by mouth every evening.   carvedilol 6.25 MG tablet Commonly known as: COREG Take 1 tablet (6.25 mg total) by mouth 2 (two) times daily with a meal.   hydrALAZINE 25 MG tablet Commonly known as: APRESOLINE Take 1.5 tablets (37.5 mg total) by mouth 3 (three) times daily.   isosorbide mononitrate 60 MG 24 hr tablet Commonly known as: IMDUR Take 1 tablet (60 mg total) by mouth daily.   levETIRAcetam 500 MG tablet Commonly known as: KEPPRA Take 1 tablet (500 mg total)  by mouth 2 (two) times daily.   metFORMIN 500 MG tablet Commonly known as: GLUCOPHAGE Take 1 tablet (500 mg total) by mouth 2 (two) times daily with a meal.   nicotine 21 mg/24hr patch Commonly known as: NICODERM CQ - dosed in mg/24 hours Place 1 patch (21 mg total) onto the skin daily.        Allergies  Allergen Reactions   Lisinopril Cough     Consultations: None   Procedures/Studies: CT Head Wo Contrast  Result Date: 07/25/2021 CLINICAL DATA:  Mental status change, heat exposure EXAM: CT HEAD WITHOUT CONTRAST TECHNIQUE: Contiguous axial images were obtained from the base of the skull through the vertex without intravenous contrast. RADIATION DOSE REDUCTION: This exam was performed according to the departmental dose-optimization program which includes automated exposure control, adjustment of the mA and/or kV according to patient size and/or use of iterative reconstruction technique. COMPARISON:  02/07/2021 FINDINGS: Brain: No evidence of acute infarction, hemorrhage, mass, mass effect, or midline shift. No hydrocephalus or extra-axial fluid collection. Redemonstrated moderate volume encephalomalacia in the right frontal lobe. Lacunar infarct in the left lentiform nucleus. Vascular: No hyperdense vessel. Skull: Normal. Negative for fracture or focal lesion. Sinuses/Orbits: Small mucous retention cyst in the posterior right ethmoid air cells. The orbits are unremarkable. Other: The mastoid air cells are well aerated. IMPRESSION: No acute intracranial process. Electronically Signed   By: Wiliam Ke M.D.   On: 07/25/2021 19:59   (Echo, Carotid, EGD, Colonoscopy, ERCP)    Subjective: No complaints feels great.  Discharge Exam: Vitals:   07/27/21 0316 07/27/21 0700  BP: (!) 162/91 (!) 175/97  Pulse: 65 64  Resp: 15 19  Temp: 98.3 F (36.8 C) 98.2 F (36.8 C)  SpO2: 96% 98%   Vitals:   07/26/21 1952 07/26/21 2339 07/27/21 0316 07/27/21 0700  BP: (!) 176/98 (!) 156/83 (!) 162/91 (!) 175/97  Pulse: 62 63 65 64  Resp: 17 20 15 19   Temp: 98.4 F (36.9 C) 98.1 F (36.7 C) 98.3 F (36.8 C) 98.2 F (36.8 C)  TempSrc: Oral Oral Oral Oral  SpO2: 97% 98% 96% 98%  Weight:      Height:        General: Pt is alert, awake, not in acute distress Cardiovascular: RRR, S1/S2 +, no rubs, no gallops Respiratory: CTA bilaterally,  no wheezing, no rhonchi Abdominal: Soft, NT, ND, bowel sounds + Extremities: no edema, no cyanosis    The results of significant diagnostics from this hospitalization (including imaging, microbiology, ancillary and laboratory) are listed below for reference.     Microbiology: No results found for this or any previous visit (from the past 240 hour(s)).   Labs: BNP (last 3 results) No results for input(s): "BNP" in the last 8760 hours. Basic Metabolic Panel: Recent Labs  Lab 07/25/21 1730 07/25/21 2022 07/27/21 0242  NA 139 139 138  K 4.0 3.9 3.2*  CL 109 108 106  CO2 14* 21* 23  GLUCOSE 117* 88 98  BUN 15 12 6   CREATININE 1.73* 1.75* 1.27*  CALCIUM 9.0 9.1 8.9  MG  --  2.4  --    Liver Function Tests: Recent Labs  Lab 07/25/21 1730  AST 18  ALT 12  ALKPHOS 49  BILITOT 0.5  PROT 6.2*  ALBUMIN 3.8   No results for input(s): "LIPASE", "AMYLASE" in the last 168 hours. No results for input(s): "AMMONIA" in the last 168 hours. CBC: Recent Labs  Lab 07/25/21 1730 07/26/21 0200  WBC 5.1 8.5  NEUTROABS 2.0  --   HGB 12.0* 12.2*  HCT 38.1* 36.7*  MCV 85.4 82.3  PLT 219 202   Cardiac Enzymes: Recent Labs  Lab 07/25/21 1730  CKTOTAL 109   BNP: Invalid input(s): "POCBNP" CBG: Recent Labs  Lab 07/26/21 0736 07/26/21 1145 07/26/21 1642 07/26/21 2130 07/27/21 0637  GLUCAP 93 99 110* 106* 96   D-Dimer No results for input(s): "DDIMER" in the last 72 hours. Hgb A1c No results for input(s): "HGBA1C" in the last 72 hours. Lipid Profile No results for input(s): "CHOL", "HDL", "LDLCALC", "TRIG", "CHOLHDL", "LDLDIRECT" in the last 72 hours. Thyroid function studies No results for input(s): "TSH", "T4TOTAL", "T3FREE", "THYROIDAB" in the last 72 hours.  Invalid input(s): "FREET3" Anemia work up No results for input(s): "VITAMINB12", "FOLATE", "FERRITIN", "TIBC", "IRON", "RETICCTPCT" in the last 72 hours. Urinalysis    Component Value Date/Time    COLORURINE STRAW (A) 07/25/2021 1740   APPEARANCEUR CLEAR 07/25/2021 1740   LABSPEC 1.010 07/25/2021 1740   PHURINE 5.0 07/25/2021 1740   GLUCOSEU NEGATIVE 07/25/2021 1740   HGBUR SMALL (A) 07/25/2021 1740   BILIRUBINUR NEGATIVE 07/25/2021 1740   BILIRUBINUR negative 01/11/2021 1140   KETONESUR NEGATIVE 07/25/2021 1740   PROTEINUR 30 (A) 07/25/2021 1740   UROBILINOGEN 0.2 01/11/2021 1140   NITRITE NEGATIVE 07/25/2021 1740   LEUKOCYTESUR NEGATIVE 07/25/2021 1740   Sepsis Labs Recent Labs  Lab 07/25/21 1730 07/26/21 0200  WBC 5.1 8.5   Microbiology No results found for this or any previous visit (from the past 240 hour(s)).   SIGNED:   Marinda Elk, MD  [facility] 07/27/2021, 9:42 AM Pager   If 7PM-7AM, please contact night-coverage [email or web address] Password [email or web address]

## 2021-07-27 NOTE — Progress Notes (Signed)
Nsg Discharge Note  Admit Date:  07/25/2021 Discharge date: 07/27/2021   Steven Higgins to be D/C'd Home per MD order.  AVS completed.  Patient/caregiver able to verbalize understanding.  Discharge Medication: Allergies as of 07/27/2021       Reactions   Lisinopril Cough        Medication List     TAKE these medications    atorvastatin 20 MG tablet Commonly known as: LIPITOR Take 1 tablet (20 mg total) by mouth every evening.   carvedilol 6.25 MG tablet Commonly known as: COREG Take 1 tablet (6.25 mg total) by mouth 2 (two) times daily with a meal.   hydrALAZINE 25 MG tablet Commonly known as: APRESOLINE Take 1.5 tablets (37.5 mg total) by mouth 3 (three) times daily.   isosorbide mononitrate 60 MG 24 hr tablet Commonly known as: IMDUR Take 1 tablet (60 mg total) by mouth daily.   levETIRAcetam 500 MG tablet Commonly known as: KEPPRA Take 1 tablet (500 mg total) by mouth 2 (two) times daily.   metFORMIN 500 MG tablet Commonly known as: GLUCOPHAGE Take 1 tablet (500 mg total) by mouth 2 (two) times daily with a meal.   nicotine 21 mg/24hr patch Commonly known as: NICODERM CQ - dosed in mg/24 hours Place 1 patch (21 mg total) onto the skin daily.        Discharge Assessment: Vitals:   07/27/21 0316 07/27/21 0700  BP: (!) 162/91 (!) 175/97  Pulse: 65 64  Resp: 15 19  Temp: 98.3 F (36.8 C) 98.2 F (36.8 C)  SpO2: 96% 98%   Skin clean, dry and intact without evidence of skin break down, no evidence of skin tears noted. IV catheter discontinued intact. Site without signs and symptoms of complications - no redness or edema noted at insertion site, patient denies c/o pain - only slight tenderness at site.  Dressing with slight pressure applied.  D/c Instructions-Education: Discharge instructions given to patient/family with verbalized understanding. D/c education completed with patient/family including follow up instructions, medication list, d/c activities  limitations if indicated, with other d/c instructions as indicated by MD - patient able to verbalize understanding, all questions fully answered. Patient instructed to return to ED, call 911, or call MD for any changes in condition.  Patient escorted via WC, and D/C home via private auto.  Kizzie Bane, RN 07/27/2021 11:46 AM

## 2021-07-27 NOTE — TOC Transition Note (Signed)
Transition of Care [facility]) - CM/SW Discharge Note   Patient Details  Name: Steven Higgins MRN: [id number] Date of Birth: 09-07-1970  Transition of Care [facility]) CM/SW Contact:  Kermit Balo, RN Phone Number: 07/27/2021, 11:49 AM   Clinical Narrative:    Pt is discharging back to the [facility] where he has been staying in his Steven Higgins. He sees Heide Spark at the [facility] for his PCP.  He manages his own medications and denies issues normally but states [facility] and they said it wasn't called in. CM spoke to [facility] and she was not aware of the Keppra. She is going to now follow up with him on this medication.  Meds for d/c filled through [facility] pharmacy and will be delivered to the patient. These were covered with [employer].  CM has provided him a cab voucher to get him back to the [facility].    Final next level of care: Home/Self Care Barriers to Discharge: Inadequate or no insurance, Barriers Unresolved (comment)   Patient Goals and CMS Choice        Discharge Placement                       Discharge Plan and Services                                     Social Determinants of Health (SDOH) Interventions     Readmission Risk Interventions     No data to display

## 2021-08-11 ENCOUNTER — Ambulatory Visit (INDEPENDENT_AMBULATORY_CARE_PROVIDER_SITE_OTHER): Payer: Self-pay | Admitting: Nurse Practitioner

## 2021-08-11 ENCOUNTER — Encounter: Payer: Self-pay | Admitting: Nurse Practitioner

## 2021-08-11 ENCOUNTER — Other Ambulatory Visit: Payer: Self-pay

## 2021-08-11 VITALS — BP 139/87 | HR 80 | Ht 78.0 in | Wt 201.2 lb

## 2021-08-11 DIAGNOSIS — E1169 Type 2 diabetes mellitus with other specified complication: Secondary | ICD-10-CM

## 2021-08-11 DIAGNOSIS — Z1211 Encounter for screening for malignant neoplasm of colon: Secondary | ICD-10-CM

## 2021-08-11 LAB — POCT GLYCOSYLATED HEMOGLOBIN (HGB A1C)
HbA1c POC (<> result, manual entry): 6 % (ref 4.0–5.6)
HbA1c, POC (controlled diabetic range): 6 % (ref 0.0–7.0)
HbA1c, POC (prediabetic range): 6 % (ref 5.7–6.4)
Hemoglobin A1C: 6 % — AB (ref 4.0–5.6)

## 2021-08-11 MED ORDER — ACCU-CHEK SOFTCLIX LANCETS MISC
12 refills | Status: DC
  Filled 2021-08-11: qty 100, 25d supply, fill #0

## 2021-08-11 NOTE — Patient Instructions (Addendum)
1. Type 2 diabetes mellitus with other specified complication, without long-term current use of insulin (HCC)  - POCT glycosylated hemoglobin (Hb A1C) - CBC - Comprehensive metabolic panel - Accu-Chek Softclix Lancets lancets; Use as instructed  Dispense: 100 each; Refill: 12  Lab Results  Component Value Date   HGBA1C 6.0 (A) 08/11/2021   HGBA1C 6.0 08/11/2021   HGBA1C 6.0 08/11/2021   HGBA1C 6.0 08/11/2021     2. Colon cancer screening  - Ambulatory referral to Gastroenterology   Follow up:   Follow up in 3 months or sooner

## 2021-08-11 NOTE — Progress Notes (Signed)
@Patient  ID: , male    DOB: 1970/12/26, 51 y.o.   MRN: [id number]  Chief Complaint  Patient presents with   Follow-up    Pt is here for 6 month's follow up visit.    Referring provider: [id number], NP  HPI  Steven Higgins is a 51 y.o. year old male patient of  52, NP with a history of type 2 diabetes mellitus (A1c 6.4), hypertension, hyperlipidemia.  Hyperaldosteronism, seizures.  Hypertension:  Patient presents for a follow up. States that he checks blood pressure at home. Ranging around 130/80. He is compliant with medications. Does need refills.    Diabetes:  Patient states that he checks his blood sugar at home and it is usually within normal range. Compliant with medications.   Denies f/c/s, n/v/d, hemoptysis, PND, leg swelling Denies chest pain or edema      Allergies  Allergen Reactions   Lisinopril Cough    Immunization History  Administered Date(s) Administered   Tdap 11/19/2020    Past Medical History:  Diagnosis Date   CAD (coronary artery disease), native coronary artery 01/22/2017   coronary Ca score of 143 and mild nonobstructive plaque in the RCA that is tortuous with aneurysmal dilatation and minimal plaque in the LAD and LCx by coronary CTA 07/2016   Carotid artery stenosis 04/04/2016   1-39% bilateral by dopplers 03/2016   Diabetes mellitus (HCC)    Ectatic abdominal aorta (HCC)    a. f/u due 2024.   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 EF 40-45% by echo 03/2016 presumed due to HTN - nuc with no ischemia, nonobstructive CAD by cor CT 07/2016.   Primary hyperaldosteronism (HCC)    Renal artery stenosis (HCC) 04/04/2016   By renal dopplers 03/2016 1-59% but not seen on CT abd 02/2017   Seizure disorder (HCC)    as sequela of small intracerebral bleed    Tobacco History: Social  History   Tobacco Use  Smoking Status Every Day   Packs/day: 0.25   Types: Cigarettes  Smokeless Tobacco Never  Tobacco Comments   07/30/16 smoke 3-4 cigarettes per day-not wearing patch   Ready to quit: Not Answered Counseling given: Not Answered Tobacco comments: 07/30/16 smoke 3-4 cigarettes per day-not wearing patch   Outpatient Encounter Medications as of 08/11/2021  Medication Sig   Accu-Chek Softclix Lancets lancets Use as instructed   atorvastatin (LIPITOR) 20 MG tablet Take 1 tablet (20 mg total) by mouth every evening.   carvedilol (COREG) 6.25 MG tablet Take 1 tablet (6.25 mg total) by mouth 2 (two) times daily with a meal.   hydrALAZINE (APRESOLINE) 25 MG tablet Take 1.5 tablets (37.5 mg total) by mouth 3 (three) times daily.   isosorbide mononitrate (IMDUR) 60 MG 24 hr tablet Take 1 tablet (60 mg total) by mouth daily.   levETIRAcetam (KEPPRA) 500 MG tablet Take 1 tablet (500 mg total) by mouth 2 (two) times daily.   metFORMIN (GLUCOPHAGE) 500 MG tablet Take 1 tablet (500 mg total) by mouth 2 (two) times daily with a meal.   nicotine (NICODERM CQ - DOSED IN MG/24 HOURS) 21 mg/24hr patch Place 1 patch (21 mg total) onto the skin daily. (Patient not taking: Reported on 07/26/2021)   [DISCONTINUED] atorvastatin (LIPITOR) 20 MG tablet Take 1 tablet (20 mg total) by  mouth every evening.   [DISCONTINUED] carvedilol (COREG) 6.25 MG tablet Take 1 tablet (6.25 mg total) by mouth 2 (two) times daily with a meal.   [DISCONTINUED] hydrALAZINE (APRESOLINE) 25 MG tablet Take 1.5 tablets (37.5 mg total) by mouth 3 (three) times daily.   [DISCONTINUED] levETIRAcetam (KEPPRA) 500 MG tablet Take 1 tablet (500 mg total) by mouth 2 (two) times daily.   [DISCONTINUED] metFORMIN (GLUCOPHAGE) 500 MG tablet Take 1 tablet (500 mg total) by mouth 2 (two) times daily with a meal.   No facility-administered encounter medications on file as of 08/11/2021.     Review of Systems  Review of Systems   Constitutional: Negative.   HENT: Negative.    Cardiovascular: Negative.   Gastrointestinal: Negative.   Allergic/Immunologic: Negative.   Neurological: Negative.   Psychiatric/Behavioral: Negative.         Physical Exam  BP 139/87 (BP Location: Left Arm, Patient Position: Sitting, Cuff Size: Normal)   Pulse 80   Ht 6\' 6"  (1.981 m)   Wt 201 lb 4 oz (91.3 kg)   SpO2 100%   BMI 23.26 kg/m   Wt Readings from Last 5 Encounters:  08/11/21 201 lb 4 oz (91.3 kg)  07/25/21 208 lb (94.3 kg)  05/09/21 209 lb (94.8 kg)  03/09/21 210 lb 12.8 oz (95.6 kg)  02/23/21 212 lb (96.2 kg)     Physical Exam Vitals and nursing note reviewed.  Constitutional:      General: He is not in acute distress.    Appearance: He is well-developed.  Cardiovascular:     Rate and Rhythm: Normal rate and regular rhythm.  Pulmonary:     Effort: Pulmonary effort is normal.     Breath sounds: Normal breath sounds.  Skin:    General: Skin is warm and dry.  Neurological:     Mental Status: He is alert and oriented to person, place, and time.      Lab Results:  CBC    Component Value Date/Time   WBC 4.6 08/11/2021 1133   WBC 8.5 07/26/2021 0200   RBC 5.53 08/11/2021 1133   RBC 4.46 07/26/2021 0200   HGB 14.4 08/11/2021 1133   HCT 45.7 08/11/2021 1133   PLT 271 08/11/2021 1133   MCV 83 08/11/2021 1133   MCH 26.0 (L) 08/11/2021 1133   MCH 27.4 07/26/2021 0200   MCHC 31.5 08/11/2021 1133   MCHC 33.2 07/26/2021 0200   RDW 13.5 08/11/2021 1133   LYMPHSABS 2.4 07/25/2021 1730   LYMPHSABS 2.5 01/11/2021 1206   MONOABS 0.4 07/25/2021 1730   EOSABS 0.1 07/25/2021 1730   EOSABS 0.1 01/11/2021 1206   BASOSABS 0.1 07/25/2021 1730   BASOSABS 0.1 01/11/2021 1206    BMET    Component Value Date/Time   NA 142 08/11/2021 1133   K 4.4 08/11/2021 1133   CL 101 08/11/2021 1133   CO2 26 08/11/2021 1133   GLUCOSE 95 08/11/2021 1133   GLUCOSE 98 07/27/2021 0242   BUN 10 08/11/2021 1133    CREATININE 1.23 08/11/2021 1133   CREATININE 1.07 03/22/2016 1533   CALCIUM 10.0 08/11/2021 1133   GFRNONAA >60 07/27/2021 0242   GFRAA 76 05/31/2016 1223    BNP No results found for: "BNP"  ProBNP No results found for: "PROBNP"  Imaging: CT Head Wo Contrast  Result Date: 07/25/2021 CLINICAL DATA:  Mental status change, heat exposure EXAM: CT HEAD WITHOUT CONTRAST TECHNIQUE: Contiguous axial images were obtained from the base of the skull through  the vertex without intravenous contrast. RADIATION DOSE REDUCTION: This exam was performed according to the departmental dose-optimization program which includes automated exposure control, adjustment of the mA and/or kV according to patient size and/or use of iterative reconstruction technique. COMPARISON:  02/07/2021 FINDINGS: Brain: No evidence of acute infarction, hemorrhage, mass, mass effect, or midline shift. No hydrocephalus or extra-axial fluid collection. Redemonstrated moderate volume encephalomalacia in the right frontal lobe. Lacunar infarct in the left lentiform nucleus. Vascular: No hyperdense vessel. Skull: Normal. Negative for fracture or focal lesion. Sinuses/Orbits: Small mucous retention cyst in the posterior right ethmoid air cells. The orbits are unremarkable. Other: The mastoid air cells are well aerated. IMPRESSION: No acute intracranial process. Electronically Signed   By: Wiliam Ke M.D.   On: 07/25/2021 19:59     Assessment & Plan:   Diabetes mellitus (HCC) - POCT glycosylated hemoglobin (Hb A1C) - CBC - Comprehensive metabolic panel - Accu-Chek Softclix Lancets lancets; Use as instructed  Dispense: 100 each; Refill: 12  Lab Results  Component Value Date   HGBA1C 6.0 (A) 08/11/2021   HGBA1C 6.0 08/11/2021   HGBA1C 6.0 08/11/2021   HGBA1C 6.0 08/11/2021     2. Colon cancer screening  - Ambulatory referral to Gastroenterology   Follow up:   Follow up in 3 months or sooner     Ivonne Andrew,  NP 08/21/2021

## 2021-08-12 LAB — COMPREHENSIVE METABOLIC PANEL
ALT: 12 IU/L (ref 0–44)
AST: 15 IU/L (ref 0–40)
Albumin/Globulin Ratio: 2 (ref 1.2–2.2)
Albumin: 4.8 g/dL (ref 4.1–5.1)
Alkaline Phosphatase: 70 IU/L (ref 44–121)
BUN/Creatinine Ratio: 8 — ABNORMAL LOW (ref 9–20)
BUN: 10 mg/dL (ref 6–24)
Bilirubin Total: 0.6 mg/dL (ref 0.0–1.2)
CO2: 26 mmol/L (ref 20–29)
Calcium: 10 mg/dL (ref 8.7–10.2)
Chloride: 101 mmol/L (ref 96–106)
Creatinine, Ser: 1.23 mg/dL (ref 0.76–1.27)
Globulin, Total: 2.4 g/dL (ref 1.5–4.5)
Glucose: 95 mg/dL (ref 70–99)
Potassium: 4.4 mmol/L (ref 3.5–5.2)
Sodium: 142 mmol/L (ref 134–144)
Total Protein: 7.2 g/dL (ref 6.0–8.5)
eGFR: 72 mL/min/{1.73_m2} (ref >59)

## 2021-08-12 LAB — CBC
Hematocrit: 45.7 % (ref 37.5–51.0)
Hemoglobin: 14.4 g/dL (ref 13.0–17.7)
MCH: 26 pg — ABNORMAL LOW (ref 26.6–33.0)
MCHC: 31.5 g/dL (ref 31.5–35.7)
MCV: 83 fL (ref 79–97)
Platelets: 271 10*3/uL (ref 150–450)
RBC: 5.53 x10E6/uL (ref 4.14–5.80)
RDW: 13.5 % (ref 11.6–15.4)
WBC: 4.6 10*3/uL (ref 3.4–10.8)

## 2021-08-17 ENCOUNTER — Other Ambulatory Visit: Payer: Self-pay

## 2021-08-21 ENCOUNTER — Encounter: Payer: Self-pay | Admitting: Nurse Practitioner

## 2021-08-21 DIAGNOSIS — E119 Type 2 diabetes mellitus without complications: Secondary | ICD-10-CM | POA: Insufficient documentation

## 2021-08-21 NOTE — Assessment & Plan Note (Signed)
-   POCT glycosylated hemoglobin (Hb A1C) - CBC - Comprehensive metabolic panel - Accu-Chek Softclix Lancets lancets; Use as instructed  Dispense: 100 each; Refill: 12  Lab Results  Component Value Date   HGBA1C 6.0 (A) 08/11/2021   HGBA1C 6.0 08/11/2021   HGBA1C 6.0 08/11/2021   HGBA1C 6.0 08/11/2021     2. Colon cancer screening  - Ambulatory referral to Gastroenterology   Follow up:   Follow up in 3 months or sooner
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
## 2021-08-23 ENCOUNTER — Telehealth: Payer: Self-pay | Admitting: Clinical

## 2021-08-23 ENCOUNTER — Ambulatory Visit: Payer: Self-pay | Admitting: Adult Health

## 2021-08-23 NOTE — Telephone Encounter (Signed)
Integrated Behavioral Health General Follow Up Note  08/23/2021 Name: Steven Higgins MRN: [id number] DOB: [birth year] Steven Higgins is a 51 y.o. year old male who sees Placey, Chales Abrahams, NP for primary care. LCSW was initially consulted to assess patient needs and assist with community resources.  Interpreter: No.   Interpreter Name & Language: none  Assessment: Patient experiencing homelessness and financial difficulties related to no income.  Ongoing Intervention: Patient called CSW and requested assistance with new [employer] and [employer] application, as his has expired. He will pick up a blank application today. CSW to follow and assist patient in submitting these applications.  Review of patient status, including review of consultants reports, relevant laboratory and other test results, and collaboration with appropriate care team members and the patient's provider was performed as part of comprehensive patient evaluation and provision of services.    Abigail Butts, LCSW [facility] [phone]

## 2021-08-24 ENCOUNTER — Telehealth: Payer: Self-pay

## 2021-08-24 ENCOUNTER — Other Ambulatory Visit: Payer: Self-pay

## 2021-08-24 DIAGNOSIS — G40909 Epilepsy, unspecified, not intractable, without status epilepticus: Secondary | ICD-10-CM

## 2021-08-24 MED ORDER — LEVETIRACETAM 500 MG PO TABS
500.0000 mg | ORAL_TABLET | Freq: Two times a day (BID) | ORAL | 5 refills | Status: DC
  Filled 2021-08-24 (×2): qty 60, 30d supply, fill #0
  Filled 2021-10-05: qty 60, 30d supply, fill #1
  Filled 2021-11-06: qty 60, 30d supply, fill #2
  Filled 2021-12-06: qty 60, 30d supply, fill #3
  Filled 2022-01-03: qty 60, 30d supply, fill #4

## 2021-08-25 ENCOUNTER — Other Ambulatory Visit: Payer: Self-pay

## 2021-08-29 ENCOUNTER — Telehealth: Payer: Self-pay | Admitting: Clinical

## 2021-08-29 NOTE — Telephone Encounter (Signed)
No additional notes needed  

## 2021-08-29 NOTE — Telephone Encounter (Signed)
[facility]ated [facility]al Health General Follow Up Note  08/29/2021 Name: Steven Higgins MRN: [id number] DOB: 1970-11-15 [name] is a 51 y.o. year old male who sees Placey, Chales Abrahams, NP for primary care. LCSW was initially consulted to assess patient needs and assist with community resources.  Interpreter: No.   Interpreter Name & Language: none  Assessment: Patient experiencing homelessness and financial difficulties related to no income.  Ongoing Intervention: CSW called patient to follow up. 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 He reported he was denied for disability. He would like to appeal. He is agreeable to a referral to Legal Aid for assistance with this. CSW made referral and also provided patient with Legal Aid contact information.   Review of patient status, including review of consultants reports, relevant laboratory and other test results, and collaboration with appropriate care team members and the patient's provider was performed as part of comprehensive patient evaluation and provision of services.    Abigail Butts, LCSW [facility] [phone]

## 2021-10-05 ENCOUNTER — Other Ambulatory Visit: Payer: Self-pay

## 2021-10-06 ENCOUNTER — Emergency Department (HOSPITAL_COMMUNITY)
Admission: EM | Admit: 2021-10-06 | Discharge: 2021-10-06 | Disposition: A | Discharge status: Home / Self Care | Payer: Self-pay | Attending: Emergency Medicine | Admitting: Emergency Medicine

## 2021-10-06 ENCOUNTER — Other Ambulatory Visit: Payer: Self-pay

## 2021-10-06 ENCOUNTER — Encounter (HOSPITAL_COMMUNITY): Payer: Self-pay

## 2021-10-06 DIAGNOSIS — R000 Tachycardia, unspecified: Secondary | ICD-10-CM | POA: Insufficient documentation

## 2021-10-06 DIAGNOSIS — R569 Unspecified convulsions: Secondary | ICD-10-CM

## 2021-10-06 DIAGNOSIS — G40909 Epilepsy, unspecified, not intractable, without status epilepticus: Secondary | ICD-10-CM | POA: Insufficient documentation

## 2021-10-06 DIAGNOSIS — R519 Headache, unspecified: Secondary | ICD-10-CM | POA: Insufficient documentation

## 2021-10-06 LAB — CBC WITH DIFFERENTIAL/PLATELET
Abs Immature Granulocytes: 0.11 10*3/uL — ABNORMAL HIGH (ref 0.00–0.07)
Basophils Absolute: 0.1 10*3/uL (ref 0.0–0.1)
Basophils Relative: 1 %
Eosinophils Absolute: 0.1 10*3/uL (ref 0.0–0.5)
Eosinophils Relative: 1 %
HCT: 55.5 % — ABNORMAL HIGH (ref 39.0–52.0)
Hemoglobin: 16.5 g/dL (ref 13.0–17.0)
Immature Granulocytes: 1 %
Lymphocytes Relative: 45 %
Lymphs Abs: 5.8 10*3/uL — ABNORMAL HIGH (ref 0.7–4.0)
MCH: 27 pg (ref 26.0–34.0)
MCHC: 29.7 g/dL — ABNORMAL LOW (ref 30.0–36.0)
MCV: 90.8 fL (ref 80.0–100.0)
Monocytes Absolute: 0.9 10*3/uL (ref 0.1–1.0)
Monocytes Relative: 7 %
Neutro Abs: 5.9 10*3/uL (ref 1.7–7.7)
Neutrophils Relative %: 45 %
Platelets: 281 10*3/uL (ref 150–400)
RBC: 6.11 MIL/uL — ABNORMAL HIGH (ref 4.22–5.81)
RDW: 13.7 % (ref 11.5–15.5)
WBC: 12.8 10*3/uL — ABNORMAL HIGH (ref 4.0–10.5)
nRBC: 0 % (ref 0.0–0.2)

## 2021-10-06 LAB — ETHANOL: Alcohol, Ethyl (B): <10 mg/dL (ref <10)

## 2021-10-06 LAB — MAGNESIUM: Magnesium: 2.8 mg/dL — ABNORMAL HIGH (ref 1.7–2.4)

## 2021-10-06 LAB — CBG MONITORING, ED: Glucose-Capillary: 120 mg/dL — ABNORMAL HIGH (ref 70–99)

## 2021-10-06 LAB — PHOSPHORUS: Phosphorus: 4.4 mg/dL (ref 2.5–4.6)

## 2021-10-06 LAB — COMPREHENSIVE METABOLIC PANEL
ALT: 20 U/L (ref 0–44)
AST: 25 U/L (ref 15–41)
Albumin: 5.3 g/dL — ABNORMAL HIGH (ref 3.5–5.0)
Alkaline Phosphatase: 75 U/L (ref 38–126)
Anion gap: 28 — ABNORMAL HIGH (ref 5–15)
BUN: 11 mg/dL (ref 6–20)
CO2: 14 mmol/L — ABNORMAL LOW (ref 22–32)
Calcium: 10.9 mg/dL — ABNORMAL HIGH (ref 8.9–10.3)
Chloride: 105 mmol/L (ref 98–111)
Creatinine, Ser: 1.48 mg/dL — ABNORMAL HIGH (ref 0.61–1.24)
GFR, Estimated: 58 mL/min — ABNORMAL LOW (ref >60)
Glucose, Bld: 123 mg/dL — ABNORMAL HIGH (ref 70–99)
Potassium: 3.4 mmol/L — ABNORMAL LOW (ref 3.5–5.1)
Sodium: 147 mmol/L — ABNORMAL HIGH (ref 135–145)
Total Bilirubin: 1.1 mg/dL (ref 0.3–1.2)
Total Protein: 9.3 g/dL — ABNORMAL HIGH (ref 6.5–8.1)

## 2021-10-06 MED ORDER — LACTATED RINGERS IV SOLN: INTRAVENOUS | Status: DC

## 2021-10-06 MED ORDER — LEVETIRACETAM IN NACL 1000 MG/100ML IV SOLN
1000.0000 mg | Freq: Once | INTRAVENOUS | Status: AC
  Administered 2021-10-06: 1000 mg via INTRAVENOUS
  Filled 2021-10-06: qty 100

## 2021-10-06 MED ORDER — LORAZEPAM 2 MG/ML IJ SOLN
INTRAMUSCULAR | Status: AC
  Administered 2021-10-06: 1 mg
  Filled 2021-10-06: qty 1

## 2021-10-06 NOTE — ED Triage Notes (Addendum)
Per EMS, patient from [facility] where he was picking up his keppra. Staff reports patient was waiting in line and had a seizure lasting 1-2 minutes. Postictal and incontinent of urine on EMS  arrival. Regained orientation with EMS and A&Ox4 at this time. Last dose of keppra x3 days ago.  20g R hand   BP 161/98 HR 105 94% RA CBG 202

## 2021-10-06 NOTE — Discharge Instructions (Signed)
1.  Resume your Keppra tomorrow morning as prescribed.  You were given an IV dose of Keppra in the emergency department. 2.  If you fail to take your medication, you are very likely to have seizures that could possibly lead to death or severe disability.

## 2021-10-06 NOTE — ED Provider Triage Note (Signed)
Emergency Medicine Provider Triage Evaluation Note  Schawn Byas , a 51 y.o. male  was evaluated in triage.  Pt complains of seizures. He missed keppra dose four days ago. He was waiting in line to pick up prescription when he had a tonic clonic seizure around 10:30 today. He only takes keppra. Hx of seizures  Alert and oriented   Review of Systems  Positive:  Negative:   Physical Exam  BP 135/86 (BP Location: Left Arm)   Pulse 91   Temp 98.7 F (37.1 C) (Oral)   Resp 16   Ht 6\' 6"  (1.981 m)   Wt 90.7 kg   SpO2 93%   BMI 23.11 kg/m  Gen:   Awake, no distress   Resp:  Normal effort  MSK:   Moves extremities without difficulty  Other:    Medical Decision Making  Medically screening exam initiated at 1:59 PM.  Appropriate orders placed.  Alice Ruis was informed that the remainder of the evaluation will be completed by another provider, this initial triage assessment does not replace that evaluation, and the importance of remaining in the ED until their evaluation is complete.     Adolphus Birchwood, PA-C 10/06/21 1400

## 2021-10-06 NOTE — ED Provider Notes (Signed)
[facility]-EMERGENCY DEPT Provider Note   CSN: [id number] Arrival date & time: 10/06/21  1247     History  Chief Complaint  Patient presents with   Seizures    Steven Higgins is a 51 y.o. male.  HPI Patient was at the [facility] pharmacy picking up his Keppra.  He was waiting in line and had a seizure lasting 1 to 2 minutes.  Patient was postictal and had been incontinent of urine.  He was transported via EMS and regained orientation during transport.  He reported his last dose of Keppra was 3 days ago.  While in the waiting room awaiting a room, patient had another seizure lasting several minutes.   After regaining appropriate consciousness and orientation, patient denies that he had any other complaints.  He reports he was planning to restart his medications and had not recently been ill or having other complicating problems.  He reports he did have a headache after the seizure but it is resolving now.  He reports that is typical.    Home Medications Prior to Admission medications   Medication Sig Start Date End Date Taking? Authorizing Provider  Accu-Chek Softclix Lancets lancets Use as instructed 08/11/21   Ivonne Andrew, NP  atorvastatin (LIPITOR) 20 MG tablet Take 1 tablet (20 mg total) by mouth every evening. 07/27/21 10/25/21  Marinda Elk, MD  carvedilol (COREG) 6.25 MG tablet Take 1 tablet (6.25 mg total) by mouth 2 (two) times daily with a meal. 07/27/21   Marinda Elk, MD  hydrALAZINE (APRESOLINE) 25 MG tablet Take 1.5 tablets (37.5 mg total) by mouth 3 (three) times daily. 07/27/21 01/23/22  Marinda Elk, MD  isosorbide mononitrate (IMDUR) 60 MG 24 hr tablet Take 1 tablet (60 mg total) by mouth daily. 01/11/21 08/25/21  Orion Crook I, NP  levETIRAcetam (KEPPRA) 500 MG tablet Take 1 tablet (500 mg total) by mouth 2 (two) times daily. 08/24/21 02/20/22  Ivonne Andrew, NP  metFORMIN (GLUCOPHAGE) 500 MG tablet Take 1  tablet (500 mg total) by mouth 2 (two) times daily with a meal. 07/27/21   Marinda Elk, MD  nicotine (NICODERM CQ - DOSED IN MG/24 HOURS) 21 mg/24hr patch Place 1 patch (21 mg total) onto the skin daily. Patient not taking: Reported on 07/26/2021 05/09/21   Orion Crook I, NP      Allergies    Lisinopril    Review of Systems   Review of Systems  Physical Exam Updated Vital Signs BP (!) 168/99   Pulse 92   Temp 98.3 F (36.8 C) (Oral)   Resp 14   Ht 6\' 6"  (1.981 m)   Wt 90.7 kg   SpO2 95%   BMI 23.11 kg/m  Physical Exam Constitutional:      Comments: Upon first examination patient is seen in the resuscitation room.  He is lethargic and confused.  He is not having active seizure but is clearly disoriented.  HENT:     Head: Normocephalic and atraumatic.     Mouth/Throat:     Comments: Airway examined and patient's partial denture was in the posterior airway, manually removed. Eyes:     Extraocular Movements: Extraocular movements intact.     Comments: Initially eye movement slightly roving but not staccato or nystagmus.  Patient became more oriented extraocular motions are intact.  Cardiovascular:     Rate and Rhythm: Regular rhythm. Tachycardia present.  Pulmonary:     Effort: Pulmonary effort is normal.  Breath sounds: Normal breath sounds.  Abdominal:     General: There is no distension.     Palpations: Abdomen is soft.     Tenderness: There is no abdominal tenderness. There is no guarding.  Musculoskeletal:        General: No swelling, deformity or signs of injury.     Cervical back: Neck supple.  Skin:    General: Skin is warm and dry.  Neurological:     Comments: Initial evaluation is for a mildly disoriented mental status.  No focal neurologic deficits.  With brisk prompting patient is able to sit forward slightly but is having trouble following specific commands.     ED Results / Procedures / Treatments   Labs (all labs ordered are listed, but  only abnormal results are displayed) Labs Reviewed  COMPREHENSIVE METABOLIC PANEL - Abnormal; Notable for the following components:      Result Value   Sodium 147 (*)    Potassium 3.4 (*)    CO2 14 (*)    Glucose, Bld 123 (*)    Creatinine, Ser 1.48 (*)    Calcium 10.9 (*)    Total Protein 9.3 (*)    Albumin 5.3 (*)    GFR, Estimated 58 (*)    Anion gap 28 (*)    All other components within normal limits  MAGNESIUM - Abnormal; Notable for the following components:   Magnesium 2.8 (*)    All other components within normal limits  CBC WITH DIFFERENTIAL/PLATELET - Abnormal; Notable for the following components:   WBC 12.8 (*)    RBC 6.11 (*)    HCT 55.5 (*)    MCHC 29.7 (*)    Lymphs Abs 5.8 (*)    Abs Immature Granulocytes 0.11 (*)    All other components within normal limits  CBG MONITORING, ED - Abnormal; Notable for the following components:   Glucose-Capillary 120 (*)    All other components within normal limits  PHOSPHORUS  ETHANOL    EKG EKG Interpretation  Date/Time:  Friday October 06 2021 16:58:27 EDT Ventricular Rate:  111 PR Interval:  159 QRS Duration: 106 QT Interval:  351 QTC Calculation: 477 R Axis:   41 Text Interpretation: Sinus tachycardia Borderline repolarization abnormality Borderline prolonged QT interval tachycardia but otherwise similar to previous Confirmed by Arby Barrette [phone]) on 10/06/2021 9:19:50 PM  Radiology No results found.  Procedures Procedures   CRITICAL CARE Performed by: Arby Barrette   Total critical care time: 30 minutes  Critical care time was exclusive of separately billable procedures and treating other patients.  Critical care was necessary to treat or prevent imminent or life-threatening deterioration.  Critical care was time spent personally by me on the following activities: development of treatment plan with patient and/or surrogate as well as nursing, discussions with consultants, evaluation of patient's  response to treatment, examination of patient, obtaining history from patient or surrogate, ordering and performing treatments and interventions, ordering and review of laboratory studies, ordering and review of radiographic studies, pulse oximetry and re-evaluation of patient's condition.  Medications Ordered in ED Medications  lactated ringers infusion ( Intravenous New Bag/Given 10/06/21 1816)  LORazepam (ATIVAN) 2 MG/ML injection (1 mg  Given 10/06/21 1655)  levETIRAcetam (KEPPRA) IVPB 1000 mg/100 mL premix (0 mg Intravenous Stopped 10/06/21 1740)    ED Course/ Medical Decision Making/ A&P  Medical Decision Making Risk Prescription drug management.   Patient has known history of seizure disorder.  He had run out of medications for approximately 3 days.  He had a seizure while filling medications and brought by EMS to the emergency department.  Seizure activity had resolved and patient's orientation was normal upon arrival.  However, during the span of awaiting a room for assessment, patient did have another seizure and was brought back urgently to a resuscitation room.  At that time he was not actively seizing but disoriented and postictal.  Patient was given an IV dose of Ativan and a Keppra load with 1000 mg.  screening lab work was obtained just after the patient had seizure.  Sodium 147, bicarb 14, glucose 123, BUN/creatinine 11\1.48. White count to 12.8.  Patient is hydrated with lactated Ringer's.  Conservative rate of 100 mL/h.  Patient does have documented history of CHF but is not acutely showing any signs of congestive heart failure.  He has no respiratory distress.  No peripheral edema.  21: 15, patient has returned to normal mental status.  He is answering questions appropriately.  We have confirmed that his companion was able to fill his prescription for Keppra which He will be able to resume tomorrow.  At this time stable for discharge.  He encouraged  to maintain regular dosing of Keppra for seizure control.  Patient has high risk of possible injury or severe disability due to seizures if not controlled.          Final Clinical Impression(s) / ED Diagnoses Final diagnoses:  Seizure [facility])  Seizure disorder [facility])    Rx / [place] Orders ED Discharge Orders     None         Charlesetta Shanks, MD 10/06/21 2130

## 2021-10-09 ENCOUNTER — Other Ambulatory Visit: Payer: Self-pay

## 2021-11-06 ENCOUNTER — Other Ambulatory Visit: Payer: Self-pay

## 2021-11-13 ENCOUNTER — Ambulatory Visit (INDEPENDENT_AMBULATORY_CARE_PROVIDER_SITE_OTHER): Payer: Self-pay | Admitting: Nurse Practitioner

## 2021-11-13 ENCOUNTER — Encounter: Payer: Self-pay | Admitting: Nurse Practitioner

## 2021-11-13 ENCOUNTER — Telehealth: Payer: Self-pay

## 2021-11-13 VITALS — BP 150/82 | HR 72 | Ht 78.0 in | Wt 207.0 lb

## 2021-11-13 DIAGNOSIS — I10 Essential (primary) hypertension: Secondary | ICD-10-CM

## 2021-11-13 NOTE — Progress Notes (Signed)
@Patient  ID: , male    DOB: [birth year], 51 y.o.   MRN: [id number]  No chief complaint on file.   Referring provider: [id number], NP   HPI  Steven Higgins is a 51 y.o. year old male patient of  49, NP with a history of type 2 diabetes mellitus (A1c 6.4), hypertension, hyperlipidemia.  Hyperaldosteronism, seizures.   Hypertension:   Patient presents for a follow up. States that he checks blood pressure at home. States that blood pressures have been fluctuating. He is compliant with medications. Does need refills. states meds make him feel bad     Diabetes:   Patient states that he checks his blood sugar at home and it is usually within normal range. Compliant with medications. blood sugars in normal range   Denies f/c/s, n/v/d, hemoptysis, PND, leg swelling Denies chest pain or edema     Allergies  Allergen Reactions   Lisinopril Cough    Immunization History  Administered Date(s) Administered   Tdap 11/19/2020    Past Medical History:  Diagnosis Date   CAD (coronary artery disease), native coronary artery 01/22/2017   coronary Ca score of 143 and mild nonobstructive plaque in the RCA that is tortuous with aneurysmal dilatation and minimal plaque in the LAD and LCx by coronary CTA 07/2016   Carotid artery stenosis 04/04/2016   1-39% bilateral by dopplers 03/2016   Diabetes mellitus (HCC)    Ectatic abdominal aorta (HCC)    a. f/u due 2024.   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 EF 40-45% by echo 03/2016 presumed due to HTN - nuc with no ischemia, nonobstructive CAD by cor CT 07/2016.   Primary hyperaldosteronism (HCC)    Renal artery stenosis (HCC) 04/04/2016   By renal dopplers 03/2016 1-59% but not seen on CT abd 02/2017   Seizure disorder (HCC)    as sequela of small intracerebral bleed    Tobacco  History: Social History   Tobacco Use  Smoking Status Every Day   Packs/day: 0.25   Types: Cigarettes  Smokeless Tobacco Never  Tobacco Comments   07/30/16 smoke 3-4 cigarettes per day-not wearing patch   Ready to quit: Not Answered Counseling given: Not Answered Tobacco comments: 07/30/16 smoke 3-4 cigarettes per day-not wearing patch   Outpatient Encounter Medications as of 11/13/2021  Medication Sig   Accu-Chek Softclix Lancets lancets Use as instructed   atorvastatin (LIPITOR) 20 MG tablet Take 1 tablet (20 mg total) by mouth every evening.   carvedilol (COREG) 6.25 MG tablet Take 1 tablet (6.25 mg total) by mouth 2 (two) times daily with a meal.   hydrALAZINE (APRESOLINE) 25 MG tablet Take 1.5 tablets (37.5 mg total) by mouth 3 (three) times daily.   isosorbide mononitrate (IMDUR) 60 MG 24 hr tablet Take 1 tablet (60 mg total) by mouth daily.   levETIRAcetam (KEPPRA) 500 MG tablet Take 1 tablet (500 mg total) by mouth 2 (two) times daily.   metFORMIN (GLUCOPHAGE) 500 MG tablet Take 1 tablet (500 mg total) by mouth 2 (two) times daily with a meal.   nicotine (NICODERM CQ - DOSED IN MG/24 HOURS) 21 mg/24hr patch Place 1 patch (21 mg total) onto the skin daily. (Patient not taking: Reported on 07/26/2021)   No facility-administered encounter medications on file as of 11/13/2021.  Review of Systems  Review of Systems  Constitutional: Negative.   HENT: Negative.    Cardiovascular: Negative.   Gastrointestinal: Negative.   Allergic/Immunologic: Negative.   Neurological: Negative.   Psychiatric/Behavioral: Negative.         Physical Exam  BP (!) 150/82   Pulse 72   Ht 6\' 6"  (1.981 m)   Wt 207 lb (93.9 kg)   SpO2 98%   BMI 23.92 kg/m   Wt Readings from Last 5 Encounters:  11/13/21 207 lb (93.9 kg)  10/06/21 200 lb (90.7 kg)  08/11/21 201 lb 4 oz (91.3 kg)  07/25/21 208 lb (94.3 kg)  05/09/21 209 lb (94.8 kg)     Physical Exam Vitals and nursing note  reviewed.  Constitutional:      General: He is not in acute distress.    Appearance: He is well-developed.  Cardiovascular:     Rate and Rhythm: Normal rate and regular rhythm.  Pulmonary:     Effort: Pulmonary effort is normal.     Breath sounds: Normal breath sounds.  Skin:    General: Skin is warm and dry.  Neurological:     Mental Status: He is alert and oriented to person, place, and time.      Lab Results:  CBC    Component Value Date/Time   WBC 12.8 (H) 10/06/2021 1654   RBC 6.11 (H) 10/06/2021 1654   HGB 16.5 10/06/2021 1654   HGB 14.4 08/11/2021 1133   HCT 55.5 (H) 10/06/2021 1654   HCT 45.7 08/11/2021 1133   PLT 281 10/06/2021 1654   PLT 271 08/11/2021 1133   MCV 90.8 10/06/2021 1654   MCV 83 08/11/2021 1133   MCH 27.0 10/06/2021 1654   MCHC 29.7 (L) 10/06/2021 1654   RDW 13.7 10/06/2021 1654   RDW 13.5 08/11/2021 1133   LYMPHSABS 5.8 (H) 10/06/2021 1654   LYMPHSABS 2.5 01/11/2021 1206   MONOABS 0.9 10/06/2021 1654   EOSABS 0.1 10/06/2021 1654   EOSABS 0.1 01/11/2021 1206   BASOSABS 0.1 10/06/2021 1654   BASOSABS 0.1 01/11/2021 1206    BMET    Component Value Date/Time   NA 147 (H) 10/06/2021 1654   NA 142 08/11/2021 1133   K 3.4 (L) 10/06/2021 1654   CL 105 10/06/2021 1654   CO2 14 (L) 10/06/2021 1654   GLUCOSE 123 (H) 10/06/2021 1654   BUN 11 10/06/2021 1654   BUN 10 08/11/2021 1133   CREATININE 1.48 (H) 10/06/2021 1654   CREATININE 1.07 03/22/2016 1533   CALCIUM 10.9 (H) 10/06/2021 1654   GFRNONAA 58 (L) 10/06/2021 1654   GFRAA 76 05/31/2016 1223      Assessment & Plan:   Hypertension - AMB Referral to Pharmacy Medication Management     Follow up:  Follow up in 3 months or sooner if needed     06/02/2016, NP 11/13/2021

## 2021-11-13 NOTE — Patient Instructions (Signed)
1. Primary hypertension  - AMB Referral to Pharmacy Medication Management     Follow up:  Follow up in 3 months or sooner if needed

## 2021-11-13 NOTE — Progress Notes (Signed)
   Care Guide Note  11/13/2021 Name: Larwence Tu MRN: [id number] DOB: 09-14-1970  Referred by: Lavinia Sharps, NP Reason for referral : Care Coordination (Outreach to schedule referral with Pharm D )   Seydou Hearns is a 51 y.o. year old male who is a primary care patient of Placey, Chales Abrahams, NP. Jenna Grisanti was referred to the pharmacist for assistance related to DM.    Successful contact was made with the patient to discuss pharmacy services including being ready for the pharmacist to call at least 5 minutes before the scheduled appointment time, to have medication bottles and any blood sugar or blood pressure readings ready for review. The patient agreed to meet with the pharmacist via with the pharmacist via telephone visit on 11/29/2021.    Penne Lash, RMA Care Guide [facility] [place], [place] [address] Direct Dial: [phone] [email or web address]

## 2021-11-13 NOTE — Assessment & Plan Note (Signed)
-   AMB Referral to Pharmacy Medication Management     Follow up:  Follow up in 3 months or sooner if needed

## 2021-11-29 ENCOUNTER — Telehealth: Payer: Self-pay | Admitting: Pharmacist

## 2021-11-29 ENCOUNTER — Other Ambulatory Visit: Payer: Self-pay | Admitting: Pharmacist

## 2021-11-29 NOTE — Progress Notes (Unsigned)
Attempted to contact patient for scheduled appointment for medication management. Left HIPAA compliant message for patient to return my call at their convenience.   Catie T. Martavia Tye, PharmD, BCACP [facility] [phone]  

## 2021-12-06 ENCOUNTER — Telehealth: Payer: Self-pay

## 2021-12-06 ENCOUNTER — Other Ambulatory Visit: Payer: Self-pay

## 2021-12-06 MED ORDER — LEVETIRACETAM 500 MG PO TABS: ORAL_TABLET | ORAL | 0 refills | Status: DC

## 2021-12-06 NOTE — Progress Notes (Signed)
   Care Guide Note  12/06/2021 Name: Steven Higgins MRN: [id number] DOB: 30-Sep-1970  Referred by: Lavinia Sharps, NP Reason for referral : Care Coordination (Outreach to reschedule with pharm d )   Steven Higgins is a 51 y.o. year old male who is a primary care patient of Placey, Chales Abrahams, NP. Steven Higgins was referred to the pharmacist for assistance related to DM.    An unsuccessful telephone outreach was attempted today to contact the patient who was referred to the pharmacy team for assistance with medication management. Additional attempts will be made to contact the patient.   Penne Lash, RMA Care Guide [facility]  [place], [place] [address] Direct Dial: [phone] [email or web address]

## 2021-12-28 ENCOUNTER — Other Ambulatory Visit: Payer: Self-pay | Admitting: Pharmacist

## 2021-12-28 NOTE — Patient Instructions (Signed)
[place] has recently extended the income cut offs for Medicaid. Check here and see if you and your family qualify and how to apply:   [email or web address]   Catie TClearance Coots, PharmD, BCACP, CPP [facility] [phone]

## 2021-12-28 NOTE — Progress Notes (Signed)
12/28/2021 Name: Steven Higgins MRN: [id number] DOB: 10/25/1970  Chief Complaint  Patient presents with   Medication Management   Hypertension    Steven Higgins is a 51 y.o. year old male who presented for a telephone visit.   They were referred to the pharmacist by their PCP for assistance in managing hypertension.   Subjective:  Care Team: Primary Care Provider: Ivonne Andrew, NP ; Next Scheduled Visit: 02/14/22  Medication Access/Adherence  Current Pharmacy:  [facility] [address], [address] [place] [address] Phone: [phone] Fax: [phone]   Patient reports affordability concerns with their medications: Yes  Patient reports access/transportation concerns to their pharmacy: No  Patient reports adherence concerns with their medications:  No    Reports he also sees Lavinia Sharps at [facility], she prescribes medications to [facility], except his levetiracetam which he gets from [facility]   Diabetes:  Current medications: metformin 500 mg twice daily    Hyperlipidemia/ASCVD Risk Reduction  Current lipid lowering medications: atorvastatin 40 mg - 1/2 tablet daily  Heart Failure:  Current medications:  ACEi/ARB/ARNI: none SGLT2i: none Beta blocker: carvedilol 6.25 mg twice daily Mineralocorticoid Receptor Antagonist: none Additional antihypertensive: hydralazine 37.5 mg three times daily + isosorbide 60 mg daily  Current home blood pressure readings: ~130-140s/80-90  Tobacco Abuse: Reports down to 3-5 cigarettes per day   Health Maintenance  Health Maintenance Due  Topic Date Due   COVID-19 Vaccine (1) Never done   FOOT EXAM  Never done   OPHTHALMOLOGY EXAM  Never done   INFLUENZA VACCINE  Never done     Objective: Lab Results  Component Value Date   HGBA1C 6.0 (A) 08/11/2021   HGBA1C 6.0 08/11/2021   HGBA1C 6.0 08/11/2021   HGBA1C 6.0 08/11/2021    Lab  Results  Component Value Date   CREATININE 1.48 (H) 10/06/2021   BUN 11 10/06/2021   NA 147 (H) 10/06/2021   K 3.4 (L) 10/06/2021   CL 105 10/06/2021   CO2 14 (L) 10/06/2021    Lab Results  Component Value Date   CHOL 170 05/09/2021   HDL 52 05/09/2021   LDLCALC 92 05/09/2021   TRIG 148 05/09/2021   CHOLHDL 3.3 05/09/2021    Medications Reviewed Today     Reviewed by Alden Hipp, RPH-CPP (Pharmacist) on 12/28/21 at 1445  Med List Status: <None>   Medication Order Taking? Sig Documenting Provider Last Dose Status Informant  Accu-Chek Softclix Lancets lancets [id number]  Use as instructed Ivonne Andrew, NP  Active   atorvastatin (LIPITOR) 20 MG tablet [id number] Yes Take 1 tablet (20 mg total) by mouth every evening. Marinda Elk, MD Taking Active   carvedilol (COREG) 6.25 MG tablet [id number] Yes Take 1 tablet (6.25 mg total) by mouth 2 (two) times daily with a meal. Marinda Elk, MD Taking Active   hydrALAZINE (APRESOLINE) 25 MG tablet [id number] Yes Take 1.5 tablets (37.5 mg total) by mouth 3 (three) times daily. Marinda Elk, MD Taking Active   isosorbide mononitrate (IMDUR) 60 MG 24 hr tablet [id number] Yes Take 1 tablet (60 mg total) by mouth daily. Orion Crook I, NP Taking Active Self  levETIRAcetam (KEPPRA) 500 MG tablet [id number] Yes Take 1 tablet (500 mg total) by mouth 2 (two) times daily. Ivonne Andrew, NP Taking Active   metFORMIN (GLUCOPHAGE) 500 MG tablet [id number] Yes Take 1 tablet (500 mg total) by mouth 2 (  two) times daily with a meal. Marinda Elk, MD Taking Active   nicotine (NICODERM CQ - DOSED IN MG/24 HOURS) 21 mg/24hr patch [id number] No Place 1 patch (21 mg total) onto the skin daily.  Patient not taking: Reported on 07/26/2021   Orion Crook I, NP Not Taking Active Self  Med List Note Andres Shad, CPhT 11/20/20 1142): Marland Kitchen              Assessment/Plan:   Diabetes: - Currently controlled - Recommend  to continue current regimen  Hyperlipidemia/ASCVD Risk Reduction: - Currently controlled.  - Recommend to continue current regimen  Heart Failure: - Currently managed but with opportunities for improvement, pending medication access - Recommend to continue current regimen at this time. Patient notes he sees the provider at [facility] next week. Encouraged to bring home machine to demonstrate home BP readings.   Patient notes he has an [employer], but unsure if active. Will collaborate with LCSW  Catie Eppie Gibson, PharmD, BCACP, CPP [facility] [phone]

## 2022-01-03 ENCOUNTER — Telehealth: Payer: Self-pay | Admitting: Clinical

## 2022-01-03 ENCOUNTER — Other Ambulatory Visit: Payer: Self-pay

## 2022-01-03 NOTE — Telephone Encounter (Signed)
[facility]ated [facility]al [facility] General Follow Up Note  01/03/2022 Name: Jakiah Goree MRN: [id number] DOB: May 11, 1970 Onur Mori is a 52 y.o. year old male who sees Fenton Foy, NP for primary care. LCSW was initially consulted to assess patient needs and assist with community resources.  Interpreter: No.   Interpreter Name & Language: none  Assessment: Patient experiencing homelessness and financial difficulties related to no income.  Ongoing Intervention: Called patient to follow up. Advised by Brock Ra with pharmacy that patient continues to experience homelessness and does need to see some specialists. Due to Medicaid expansion, advised patient to go to social services and apply for Medicaid, as he is now likely eligible. Will plan to follow up with patient by phone.   Review of patient status, including review of consultants reports, relevant laboratory and other test results, and collaboration with appropriate care team members and the patient's provider was performed as part of comprehensive patient evaluation and provision of services.    Estanislado Emms, [facility] [phone]

## 2022-01-12 ENCOUNTER — Telehealth: Payer: Self-pay | Admitting: Clinical

## 2022-01-12 NOTE — Telephone Encounter (Signed)
Integrated Behavioral Health General Follow Up Note  01/12/2022 Name: Steven Higgins MRN: [id number] DOB: [birth year] Steven Higgins is a 52 y.o. year old male who sees Fenton Foy, NP for primary care. LCSW was initially consulted to assess patient needs and assist with community resources.  Interpreter: No.   Interpreter Name & Language: none  Assessment: Patient experiencing homelessness and financial difficulties related to no income.  Ongoing Intervention: Called patient to follow up. Patient reported he has not yet gone to social services to apply for Medicaid. He is staying in the pallet homes (temporary winter shelter) provided by the [facility]). Advised patient to apply for Medicaid. 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 Referred patient to medical records department.   Review of patient status, including review of consultants reports, relevant laboratory and other test results, and collaboration with appropriate care team members and the patient's provider was performed as part of comprehensive patient evaluation and provision of services.    Estanislado Emms, [facility] [phone]

## 2022-02-14 ENCOUNTER — Ambulatory Visit (INDEPENDENT_AMBULATORY_CARE_PROVIDER_SITE_OTHER): Payer: Medicaid Other | Admitting: Nurse Practitioner

## 2022-02-14 ENCOUNTER — Other Ambulatory Visit: Payer: Self-pay

## 2022-02-14 ENCOUNTER — Encounter: Payer: Self-pay | Admitting: Nurse Practitioner

## 2022-02-14 VITALS — BP 128/79 | HR 77 | Temp 97.3°F | Ht 78.0 in | Wt 204.0 lb

## 2022-02-14 DIAGNOSIS — J302 Other seasonal allergic rhinitis: Secondary | ICD-10-CM

## 2022-02-14 DIAGNOSIS — I10 Essential (primary) hypertension: Secondary | ICD-10-CM

## 2022-02-14 DIAGNOSIS — Z13220 Encounter for screening for lipoid disorders: Secondary | ICD-10-CM

## 2022-02-14 DIAGNOSIS — G40909 Epilepsy, unspecified, not intractable, without status epilepticus: Secondary | ICD-10-CM

## 2022-02-14 DIAGNOSIS — E1169 Type 2 diabetes mellitus with other specified complication: Secondary | ICD-10-CM

## 2022-02-14 LAB — POCT GLYCOSYLATED HEMOGLOBIN (HGB A1C): Hemoglobin A1C: 6.1 % — AB (ref 4.0–5.6)

## 2022-02-14 MED ORDER — LEVETIRACETAM 500 MG PO TABS
500.0000 mg | ORAL_TABLET | Freq: Two times a day (BID) | ORAL | 5 refills | Status: DC
  Filled 2022-02-14: qty 60, 30d supply, fill #0
  Filled 2022-03-28: qty 60, 30d supply, fill #1
  Filled 2022-04-24 – 2022-05-18 (×2): qty 60, 30d supply, fill #2
  Filled 2022-07-02: qty 60, 30d supply, fill #3
  Filled 2022-08-15: qty 60, 30d supply, fill #4

## 2022-02-14 MED ORDER — CETIRIZINE HCL 10 MG PO TABS
10.0000 mg | ORAL_TABLET | Freq: Every day | ORAL | 11 refills | Status: DC
  Filled 2022-02-14: qty 30, 30d supply, fill #0
  Filled 2022-04-24: qty 30, 30d supply, fill #1

## 2022-02-14 MED ORDER — ISOSORBIDE MONONITRATE ER 60 MG PO TB24
60.0000 mg | ORAL_TABLET | Freq: Every day | ORAL | 5 refills | Status: DC
  Filled 2022-02-14: qty 30, 30d supply, fill #0
  Filled 2022-04-24 – 2022-05-18 (×3): qty 30, 30d supply, fill #1
  Filled 2022-07-02: qty 30, 30d supply, fill #2

## 2022-02-14 NOTE — Progress Notes (Unsigned)
$@PatientR$  ID: Steven Higgins, male    DOB: 1970/02/22, 53 y.o.   MRN: [id number]  Chief Complaint  Patient presents with   Follow-up    B/p    Referring provider: Placey, Audrea Muscat, NP   HPI  Tacoma Drumm is a 52 y.o. year old male patient of  Bo Merino, NP with a history of type 2 diabetes mellitus (A1c 6.4), hypertension, hyperlipidemia.  Hyperaldosteronism, seizures.   Hypertension:   Patient presents for a follow up. States that he checks blood pressure at home. States that blood pressures have been fluctuating. He is compliant with medications. Does need refills. states meds make him feel bad     Diabetes:   Patient states that he checks his blood sugar at home and it is usually within normal range. Compliant with medications. blood sugars in normal range   Denies f/c/s, n/v/d, hemoptysis, PND, leg swelling Denies chest pain or edema       Allergies  Allergen Reactions   Lisinopril Cough    Immunization History  Administered Date(s) Administered   Tdap 11/19/2020    Past Medical History:  Diagnosis Date   CAD (coronary artery disease), native coronary artery 01/22/2017   coronary Ca score of 143 and mild nonobstructive plaque in the RCA that is tortuous with aneurysmal dilatation and minimal plaque in the LAD and LCx by coronary CTA 07/2016   Carotid artery stenosis 04/04/2016   1-39% bilateral by dopplers 03/2016   Diabetes mellitus ([place])    Ectatic abdominal aorta ([place])    a. f/u due 2024.   Hyperlipidemia LDL goal <70    Hypertensive heart disease with CHF ([place])    Intracerebral bleed ([place])    due to malignant HTN   NICM (nonischemic cardiomyopathy) ([place])    a. EF 40-45% by echo 03/2016 presumed due to HTN - nuc with no ischemia, nonobstructive CAD by cor CT 07/2016.   Primary hyperaldosteronism ([place])    Renal artery stenosis ([place]) 04/04/2016   By renal dopplers 03/2016 1-59% but not seen on CT abd 02/2017   Seizure disorder ([place])    as sequela of  small intracerebral bleed    Tobacco History: Social History   Tobacco Use  Smoking Status Every Day   Packs/day: 0.25   Types: Cigarettes  Smokeless Tobacco Never  Tobacco Comments   07/30/16 smoke 3-4 cigarettes per day-not wearing patch   Ready to quit: Not Answered Counseling given: Not Answered Tobacco comments: 07/30/16 smoke 3-4 cigarettes per day-not wearing patch   Outpatient Encounter Medications as of 02/14/2022  Medication Sig   Accu-Chek Softclix Lancets lancets Use as instructed   atorvastatin (LIPITOR) 20 MG tablet Take 1 tablet (20 mg total) by mouth every evening.   carvedilol (COREG) 6.25 MG tablet Take 1 tablet (6.25 mg total) by mouth 2 (two) times daily with a meal.   cetirizine (ZYRTEC) 10 MG tablet Take 1 tablet (10 mg total) by mouth daily.   hydrALAZINE (APRESOLINE) 25 MG tablet Take 1.5 tablets (37.5 mg total) by mouth 3 (three) times daily.   metFORMIN (GLUCOPHAGE) 500 MG tablet Take 1 tablet (500 mg total) by mouth 2 (two) times daily with a meal.   [DISCONTINUED] isosorbide mononitrate (IMDUR) 60 MG 24 hr tablet Take 1 tablet (60 mg total) by mouth daily.   [DISCONTINUED] levETIRAcetam (KEPPRA) 500 MG tablet Take 1 tablet (500 mg total) by mouth 2 (two) times daily.   isosorbide mononitrate (IMDUR) 60 MG 24 hr tablet Take 1  tablet (60 mg total) by mouth daily.   levETIRAcetam (KEPPRA) 500 MG tablet Take 1 tablet (500 mg total) by mouth 2 (two) times daily.   nicotine (NICODERM CQ - DOSED IN MG/24 HOURS) 21 mg/24hr patch Place 1 patch (21 mg total) onto the skin daily. (Patient not taking: Reported on 07/26/2021)   No facility-administered encounter medications on file as of 02/14/2022.     Review of Systems  Review of Systems  Constitutional: Negative.   HENT: Negative.    Cardiovascular: Negative.   Gastrointestinal: Negative.   Allergic/Immunologic: Negative.   Neurological: Negative.   Psychiatric/Behavioral: Negative.         Physical  Exam  BP 128/79   Pulse 77   Temp (!) 97.3 F (36.3 C)   Ht 6' 6"$  (1.981 m)   Wt 204 lb (92.5 kg)   SpO2 99%   BMI 23.57 kg/m   Wt Readings from Last 5 Encounters:  02/14/22 204 lb (92.5 kg)  11/13/21 207 lb (93.9 kg)  10/06/21 200 lb (90.7 kg)  08/11/21 201 lb 4 oz (91.3 kg)  07/25/21 208 lb (94.3 kg)     Physical Exam Vitals and nursing note reviewed.  Constitutional:      General: He is not in acute distress.    Appearance: He is well-developed.  Cardiovascular:     Rate and Rhythm: Normal rate and regular rhythm.  Pulmonary:     Effort: Pulmonary effort is normal.     Breath sounds: Normal breath sounds.  Skin:    General: Skin is warm and dry.  Neurological:     Mental Status: He is alert and oriented to person, place, and time.       Assessment & Plan:   Diabetes mellitus ([place]) - POCT glycosylated hemoglobin (Hb A1C) - Ambulatory referral to Podiatry - Ambulatory referral to Ophthalmology - CBC - Comprehensive metabolic panel  2. Lipid screening  - Lipid Panel  3. Seizure disorder (HCC)  - levETIRAcetam (KEPPRA) 500 MG tablet; Take 1 tablet (500 mg total) by mouth 2 (two) times daily.  Dispense: 60 tablet; Refill: 5  4. Hypertension, unspecified type  - isosorbide mononitrate (IMDUR) 60 MG 24 hr tablet; Take 1 tablet (60 mg total) by mouth daily.  Dispense: 30 tablet; Refill: 5  5. Seasonal allergies  - cetirizine (ZYRTEC) 10 MG tablet; Take 1 tablet (10 mg total) by mouth daily.  Dispense: 30 tablet; Refill: 11  Follow up:  Follow up in 3 months     Fenton Foy, NP 02/15/2022

## 2022-02-14 NOTE — Patient Instructions (Addendum)
1. Type 2 diabetes mellitus with other specified complication, without long-term current use of insulin (HCC)  - POCT glycosylated hemoglobin (Hb A1C) - Ambulatory referral to Podiatry - Ambulatory referral to Ophthalmology - CBC - Comprehensive metabolic panel  2. Lipid screening  - Lipid Panel  3. Seizure disorder (HCC)  - levETIRAcetam (KEPPRA) 500 MG tablet; Take 1 tablet (500 mg total) by mouth 2 (two) times daily.  Dispense: 60 tablet; Refill: 5  4. Hypertension, unspecified type  - isosorbide mononitrate (IMDUR) 60 MG 24 hr tablet; Take 1 tablet (60 mg total) by mouth daily.  Dispense: 30 tablet; Refill: 5  5. Seasonal allergies  - cetirizine (ZYRTEC) 10 MG tablet; Take 1 tablet (10 mg total) by mouth daily.  Dispense: 30 tablet; Refill: 11  Follow up:  Follow up in 3 months

## 2022-02-15 ENCOUNTER — Encounter: Payer: Self-pay | Admitting: Nurse Practitioner

## 2022-02-15 LAB — COMPREHENSIVE METABOLIC PANEL
ALT: 13 IU/L (ref 0–44)
AST: 19 IU/L (ref 0–40)
Albumin/Globulin Ratio: 1.9 (ref 1.2–2.2)
Albumin: 4.6 g/dL (ref 4.1–5.1)
Alkaline Phosphatase: 66 IU/L (ref 44–121)
BUN/Creatinine Ratio: 8 — ABNORMAL LOW (ref 9–20)
BUN: 10 mg/dL (ref 6–24)
Bilirubin Total: 0.6 mg/dL (ref 0.0–1.2)
CO2: 23 mmol/L (ref 20–29)
Calcium: 9.7 mg/dL (ref 8.7–10.2)
Chloride: 104 mmol/L (ref 96–106)
Creatinine, Ser: 1.28 mg/dL — ABNORMAL HIGH (ref 0.76–1.27)
Globulin, Total: 2.4 g/dL (ref 1.5–4.5)
Glucose: 99 mg/dL (ref 70–99)
Potassium: 4.5 mmol/L (ref 3.5–5.2)
Sodium: 144 mmol/L (ref 134–144)
Total Protein: 7 g/dL (ref 6.0–8.5)
eGFR: 69 mL/min/{1.73_m2} (ref >59)

## 2022-02-15 LAB — CBC
Hematocrit: 40.9 % (ref 37.5–51.0)
Hemoglobin: 13.8 g/dL (ref 13.0–17.7)
MCH: 26.6 pg (ref 26.6–33.0)
MCHC: 33.7 g/dL (ref 31.5–35.7)
MCV: 79 fL (ref 79–97)
Platelets: 262 10*3/uL (ref 150–450)
RBC: 5.19 x10E6/uL (ref 4.14–5.80)
RDW: 12.8 % (ref 11.6–15.4)
WBC: 4.2 10*3/uL (ref 3.4–10.8)

## 2022-02-15 LAB — LIPID PANEL
Chol/HDL Ratio: 2.6 ratio (ref 0.0–5.0)
Cholesterol, Total: 133 mg/dL (ref 100–199)
HDL: 52 mg/dL (ref >39)
LDL Chol Calc (NIH): 68 mg/dL (ref 0–99)
Triglycerides: 61 mg/dL (ref 0–149)
VLDL Cholesterol Cal: 13 mg/dL (ref 5–40)

## 2022-02-15 NOTE — Assessment & Plan Note (Signed)
-   POCT glycosylated hemoglobin (Hb A1C) - Ambulatory referral to Podiatry - Ambulatory referral to Ophthalmology - CBC - Comprehensive metabolic panel  2. Lipid screening  - Lipid Panel  3. Seizure disorder (HCC)  - levETIRAcetam (KEPPRA) 500 MG tablet; Take 1 tablet (500 mg total) by mouth 2 (two) times daily.  Dispense: 60 tablet; Refill: 5  4. Hypertension, unspecified type  - isosorbide mononitrate (IMDUR) 60 MG 24 hr tablet; Take 1 tablet (60 mg total) by mouth daily.  Dispense: 30 tablet; Refill: 5  5. Seasonal allergies  - cetirizine (ZYRTEC) 10 MG tablet; Take 1 tablet (10 mg total) by mouth daily.  Dispense: 30 tablet; Refill: 11  Follow up:  Follow up in 3 months

## 2022-03-09 ENCOUNTER — Other Ambulatory Visit: Payer: Self-pay

## 2022-03-28 ENCOUNTER — Other Ambulatory Visit: Payer: Self-pay

## 2022-03-29 ENCOUNTER — Other Ambulatory Visit: Payer: Self-pay

## 2022-04-18 ENCOUNTER — Encounter (HOSPITAL_COMMUNITY): Payer: Self-pay | Admitting: Emergency Medicine

## 2022-04-18 ENCOUNTER — Other Ambulatory Visit: Payer: Self-pay

## 2022-04-18 ENCOUNTER — Emergency Department (HOSPITAL_COMMUNITY)
Admission: EM | Admit: 2022-04-18 | Discharge: 2022-04-18 | Disposition: A | Discharge status: Home / Self Care | Payer: Commercial Managed Care - HMO | Attending: Emergency Medicine | Admitting: Emergency Medicine

## 2022-04-18 DIAGNOSIS — I129 Hypertensive chronic kidney disease with stage 1 through stage 4 chronic kidney disease, or unspecified chronic kidney disease: Secondary | ICD-10-CM | POA: Insufficient documentation

## 2022-04-18 DIAGNOSIS — Z79899 Other long term (current) drug therapy: Secondary | ICD-10-CM | POA: Diagnosis not present

## 2022-04-18 DIAGNOSIS — Z7984 Long term (current) use of oral hypoglycemic drugs: Secondary | ICD-10-CM | POA: Insufficient documentation

## 2022-04-18 DIAGNOSIS — Y92410 Unspecified street and highway as the place of occurrence of the external cause: Secondary | ICD-10-CM | POA: Insufficient documentation

## 2022-04-18 DIAGNOSIS — S161XXA Strain of muscle, fascia and tendon at neck level, initial encounter: Secondary | ICD-10-CM | POA: Diagnosis not present

## 2022-04-18 DIAGNOSIS — M542 Cervicalgia: Secondary | ICD-10-CM | POA: Diagnosis present

## 2022-04-18 DIAGNOSIS — N189 Chronic kidney disease, unspecified: Secondary | ICD-10-CM | POA: Diagnosis not present

## 2022-04-18 DIAGNOSIS — E119 Type 2 diabetes mellitus without complications: Secondary | ICD-10-CM | POA: Diagnosis not present

## 2022-04-18 MED ORDER — ACETAMINOPHEN 500 MG PO TABS
1000.0000 mg | ORAL_TABLET | Freq: Once | ORAL | Status: AC
  Administered 2022-04-18: 1000 mg via ORAL
  Filled 2022-04-18: qty 2

## 2022-04-18 MED ORDER — LIDOCAINE 5 % EX PTCH
1.0000 | MEDICATED_PATCH | Freq: Once | CUTANEOUS | Status: DC
  Administered 2022-04-18: 1 via TRANSDERMAL
  Filled 2022-04-18: qty 1

## 2022-04-18 MED ORDER — LIDOCAINE 5 % EX PTCH
1.0000 | MEDICATED_PATCH | CUTANEOUS | 0 refills | Status: DC
  Filled 2022-04-18: qty 30, 30d supply, fill #0
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
## 2022-04-18 NOTE — ED Notes (Signed)
Patient verbalizes understanding of discharge instructions. Opportunity for questioning and answers were provided. Armband removed by staff, pt discharged from ED. Pt taken to ED waiting room via wheel chair.  

## 2022-04-19 ENCOUNTER — Telehealth: Payer: Self-pay

## 2022-04-19 ENCOUNTER — Other Ambulatory Visit: Payer: Self-pay
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
## 2022-04-24 ENCOUNTER — Telehealth: Payer: Self-pay | Admitting: Clinical

## 2022-04-24 ENCOUNTER — Other Ambulatory Visit: Payer: Self-pay

## 2022-04-24 NOTE — Telephone Encounter (Signed)
Integrated Behavioral Health Progress Note  04/24/2022 Name: Abdulrahman Bracey MRN: [id number] DOB: 24-Oct-1970 Jule Schlabach is a 52 y.o. year old male who sees Ivonne Andrew, NP for primary care. LCSW was initially consulted to assess patient needs and assist with community resources.  Interpreter: No.   Interpreter Name & Language: none  Assessment: Patient experiencing homelessness and financial difficulties related to no income.  Ongoing Intervention: Called patient to follow up, as he had requested a social work call during his [employer]. Patient reported he continues to experience homelessness; he is currently staying at the [facility]). He is on a housing wait list. He has been working on a disability claim, but has been denied x2. He is working with a Clinical research associate on this. Planned to meet with patient on 05/03/22 when he is here for PCP follow up appointment.    Abigail Butts, LCSW [facility] [phone]

## 2022-04-25 ENCOUNTER — Other Ambulatory Visit: Payer: Self-pay

## 2022-05-01 ENCOUNTER — Other Ambulatory Visit: Payer: Self-pay

## 2022-05-03 ENCOUNTER — Ambulatory Visit: Payer: Commercial Managed Care - HMO | Admitting: Nurse Practitioner

## 2022-05-03 ENCOUNTER — Encounter: Payer: Self-pay | Admitting: Nurse Practitioner

## 2022-05-03 ENCOUNTER — Other Ambulatory Visit: Payer: Self-pay

## 2022-05-03 ENCOUNTER — Ambulatory Visit: Payer: Commercial Managed Care - HMO | Admitting: Clinical

## 2022-05-03 VITALS — BP 137/78 | HR 81 | Temp 97.9°F | Ht 78.0 in | Wt 211.6 lb

## 2022-05-03 DIAGNOSIS — Z1211 Encounter for screening for malignant neoplasm of colon: Secondary | ICD-10-CM | POA: Diagnosis not present

## 2022-05-03 DIAGNOSIS — S161XXD Strain of muscle, fascia and tendon at neck level, subsequent encounter: Secondary | ICD-10-CM | POA: Diagnosis not present

## 2022-05-03 DIAGNOSIS — E1169 Type 2 diabetes mellitus with other specified complication: Secondary | ICD-10-CM

## 2022-05-03 DIAGNOSIS — Z5900 Homelessness unspecified: Secondary | ICD-10-CM

## 2022-05-03 LAB — POCT GLYCOSYLATED HEMOGLOBIN (HGB A1C): Hemoglobin A1C: 6 % — AB (ref 4.0–5.6)

## 2022-05-03 MED ORDER — TIZANIDINE HCL 4 MG PO TABS
4.0000 mg | ORAL_TABLET | Freq: Four times a day (QID) | ORAL | 0 refills | Status: DC | PRN
  Filled 2022-05-03 – 2022-05-18 (×2): qty 30, 8d supply, fill #0

## 2022-05-03 NOTE — Assessment & Plan Note (Signed)
-   Cologuard  2. Type 2 diabetes mellitus with other specified complication, without long-term current use of insulin (HCC)  - POCT glycosylated hemoglobin (Hb A1C)  3. Strain of neck muscle, subsequent encounter  - tiZANidine (ZANAFLEX) 4 MG tablet; Take 1 tablet (4 mg total) by mouth every 6 (six) hours as needed for muscle spasms.  Dispense: 30 tablet; Refill: 0  Follow up:   Follow up as scheduled

## 2022-05-03 NOTE — Progress Notes (Signed)
Integrated Behavioral Health Progress Note  05/03/2022 Name: Steven Higgins MRN: [id number] DOB: [birth year] Steven Higgins is a 52 y.o. year old male who sees Ivonne Andrew, NP for primary care. LCSW was initially consulted to assess patient needs and assist with community resources.  Interpreter: No.   Interpreter Name & Language: none  Assessment: Patient experiencing homelessness and financial difficulties related to no income.  Ongoing Intervention: [facility] at [facility]) after PCP visit. Patient reports he continues to sleep in his vehicle. He is connected with a caseworker either through [employer] [facility]) or the [facility]), Di Kindle.   He is looking for work, though would also like to try applying for disability again; he was denied x2. He does encounter barriers in searching for jobs due to his seizure disorder. He also gets drowsy from some of his medication which can interfere with work; he was let go from a job due to falling asleep while on shift. Will send new referral to [facility] for assistance with a new disability claim. Advised that if denied this time, can connect patient with Legal Aid for assistance with his appeal. He was working with a Teaching laboratory technician on previous appeals.   Abigail Butts, LCSW [facility] [phone]

## 2022-05-03 NOTE — Patient Instructions (Addendum)
1. Colon cancer screening  - Cologuard  2. Type 2 diabetes mellitus with other specified complication, without long-term current use of insulin (HCC)  - POCT glycosylated hemoglobin (Hb A1C)  3. Strain of neck muscle, subsequent encounter  - tiZANidine (ZANAFLEX) 4 MG tablet; Take 1 tablet (4 mg total) by mouth every 6 (six) hours as needed for muscle spasms.  Dispense: 30 tablet; Refill: 0  Follow up:   Follow up as scheduled
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
## 2022-05-10 ENCOUNTER — Other Ambulatory Visit: Payer: Self-pay

## 2022-05-18 ENCOUNTER — Other Ambulatory Visit: Payer: Self-pay

## 2022-06-27 ENCOUNTER — Telehealth: Payer: Self-pay | Admitting: Clinical

## 2022-06-27 NOTE — Telephone Encounter (Signed)
Integrated Behavioral Health Progress Note  06/27/2022 Name: Steven Higgins MRN: [id number] DOB: 25-Mar-1970 Steven Higgins is a 52 y.o. year old male who sees Ivonne Andrew, NP for primary care. LCSW was initially consulted to assess patient needs and assist with community resources.  Interpreter: No.   Interpreter Name & Language: none  Assessment: Patient experiencing homelessness and financial difficulties related to no income.  Ongoing Intervention: Spoke to patient's case worker at the [facility]), [place]. She has been working with him for some time to try and get connected with housing and income. CSW had previously referred patient to [facility] for help with disability claim, but not sure of the status of that. CSW reaching out to [facility] to confirm status of his claim. CSW available from clinic as needed.  Abigail Butts, LCSW [facility] [phone]

## 2022-07-02 ENCOUNTER — Other Ambulatory Visit: Payer: Self-pay

## 2022-07-03 ENCOUNTER — Other Ambulatory Visit: Payer: Self-pay

## 2022-08-15 ENCOUNTER — Other Ambulatory Visit: Payer: Self-pay

## 2022-08-15 ENCOUNTER — Ambulatory Visit: Payer: Medicaid Other | Admitting: Nurse Practitioner

## 2022-08-15 ENCOUNTER — Encounter: Payer: Self-pay | Admitting: Nurse Practitioner

## 2022-08-15 VITALS — BP 143/88 | HR 88 | Temp 97.2°F | Wt 210.0 lb

## 2022-08-15 DIAGNOSIS — I10 Essential (primary) hypertension: Secondary | ICD-10-CM | POA: Diagnosis not present

## 2022-08-15 DIAGNOSIS — D234 Other benign neoplasm of skin of scalp and neck: Secondary | ICD-10-CM

## 2022-08-15 DIAGNOSIS — E1169 Type 2 diabetes mellitus with other specified complication: Secondary | ICD-10-CM

## 2022-08-15 DIAGNOSIS — S161XXD Strain of muscle, fascia and tendon at neck level, subsequent encounter: Secondary | ICD-10-CM | POA: Diagnosis not present

## 2022-08-15 DIAGNOSIS — Z1211 Encounter for screening for malignant neoplasm of colon: Secondary | ICD-10-CM

## 2022-08-15 LAB — POCT GLYCOSYLATED HEMOGLOBIN (HGB A1C): Hemoglobin A1C: 6.2 % — AB (ref 4.0–5.6)

## 2022-08-15 MED ORDER — ISOSORBIDE MONONITRATE ER 60 MG PO TB24
60.0000 mg | ORAL_TABLET | Freq: Every day | ORAL | 5 refills | Status: DC
  Filled 2022-08-15: qty 30, 30d supply, fill #0
  Filled 2022-10-08: qty 30, 30d supply, fill #1
  Filled 2022-12-12: qty 30, 30d supply, fill #2
  Filled 2023-02-05: qty 30, 30d supply, fill #3
  Filled 2023-03-22: qty 30, 30d supply, fill #4

## 2022-08-15 MED ORDER — TIZANIDINE HCL 4 MG PO TABS
4.0000 mg | ORAL_TABLET | Freq: Four times a day (QID) | ORAL | 0 refills | Status: DC | PRN
  Filled 2022-08-15: qty 30, 8d supply, fill #0

## 2022-08-15 MED ORDER — METFORMIN HCL 500 MG PO TABS
500.0000 mg | ORAL_TABLET | Freq: Two times a day (BID) | ORAL | 3 refills | Status: DC
Start: 2022-08-15 — End: 2023-10-03
  Filled 2022-08-15: qty 60, 30d supply, fill #0
  Filled 2022-12-12: qty 60, 30d supply, fill #1
  Filled 2023-02-05: qty 60, 30d supply, fill #2
  Filled 2023-04-10: qty 60, 30d supply, fill #3
  Filled 2023-08-02: qty 60, 30d supply, fill #4

## 2022-08-15 MED ORDER — DICLOFENAC SODIUM 75 MG PO TBEC
75.0000 mg | DELAYED_RELEASE_TABLET | Freq: Two times a day (BID) | ORAL | 0 refills | Status: DC
Start: 2022-08-15 — End: 2022-11-14
  Filled 2022-08-15: qty 30, 15d supply, fill #0

## 2022-08-15 NOTE — Patient Instructions (Addendum)
1. Hypertension, unspecified type  - isosorbide mononitrate (IMDUR) 60 MG 24 hr tablet; Take 1 tablet (60 mg total) by mouth daily.  Dispense: 30 tablet; Refill: 5  2. Colon cancer screening  - Cologuard  3. Type 2 diabetes mellitus with other specified complication, without long-term current use of insulin (HCC)  - POCT glycosylated hemoglobin (Hb A1C) - metFORMIN (GLUCOPHAGE) 500 MG tablet; Take 1 tablet (500 mg total) by mouth 2 (two) times daily with a meal.  Dispense: 180 tablet; Refill: 3 - CBC - Comprehensive metabolic panel   4. Strain of neck muscle, subsequent encounter  - tiZANidine (ZANAFLEX) 4 MG tablet; Take 1 tablet (4 mg total) by mouth every 6 (six) hours as needed for muscle spasms.  Dispense: 30 tablet; Refill: 0 - diclofenac (VOLTAREN) 75 MG EC tablet; Take 1 tablet (75 mg total) by mouth 2 (two) times daily.  Dispense: 30 tablet; Refill: 0  5. Cyst, dermoid, scalp and neck  - Ambulatory referral to Dermatology   Follow up:  Follow up in 6 months

## 2022-08-15 NOTE — Assessment & Plan Note (Signed)
-   isosorbide mononitrate (IMDUR) 60 MG 24 hr tablet; Take 1 tablet (60 mg total) by mouth daily.  Dispense: 30 tablet; Refill: 5  2. Colon cancer screening  - Cologuard  3. Type 2 diabetes mellitus with other specified complication, without long-term current use of insulin (HCC)  - POCT glycosylated hemoglobin (Hb A1C) - metFORMIN (GLUCOPHAGE) 500 MG tablet; Take 1 tablet (500 mg total) by mouth 2 (two) times daily with a meal.  Dispense: 180 tablet; Refill: 3 - CBC - Comprehensive metabolic panel   4. Strain of neck muscle, subsequent encounter  - tiZANidine (ZANAFLEX) 4 MG tablet; Take 1 tablet (4 mg total) by mouth every 6 (six) hours as needed for muscle spasms.  Dispense: 30 tablet; Refill: 0 - diclofenac (VOLTAREN) 75 MG EC tablet; Take 1 tablet (75 mg total) by mouth 2 (two) times daily.  Dispense: 30 tablet; Refill: 0  5. Cyst, dermoid, scalp and neck  - Ambulatory referral to Dermatology   Follow up:  Follow up in 6 months
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
## 2022-08-16 LAB — COMPREHENSIVE METABOLIC PANEL
ALT: 13 IU/L (ref 0–44)
AST: 18 IU/L (ref 0–40)
Albumin: 4.5 g/dL (ref 4.1–5.1)
Alkaline Phosphatase: 77 IU/L (ref 44–121)
BUN/Creatinine Ratio: 8 — ABNORMAL LOW (ref 9–20)
BUN: 11 mg/dL (ref 6–24)
Bilirubin Total: 0.5 mg/dL (ref 0.0–1.2)
CO2: 23 mmol/L (ref 20–29)
Calcium: 9.9 mg/dL (ref 8.7–10.2)
Chloride: 101 mmol/L (ref 96–106)
Creatinine, Ser: 1.3 mg/dL — ABNORMAL HIGH (ref 0.76–1.27)
Globulin, Total: 2.7 g/dL (ref 1.5–4.5)
Glucose: 73 mg/dL (ref 70–99)
Potassium: 4.2 mmol/L (ref 3.5–5.2)
Sodium: 137 mmol/L (ref 134–144)
Total Protein: 7.2 g/dL (ref 6.0–8.5)
eGFR: 67 mL/min/{1.73_m2} (ref >59)

## 2022-08-16 LAB — CBC
Hematocrit: 47.1 % (ref 37.5–51.0)
Hemoglobin: 15.9 g/dL (ref 13.0–17.7)
MCH: 27.2 pg (ref 26.6–33.0)
MCHC: 33.8 g/dL (ref 31.5–35.7)
MCV: 81 fL (ref 79–97)
Platelets: 229 10*3/uL (ref 150–450)
RBC: 5.85 x10E6/uL — ABNORMAL HIGH (ref 4.14–5.80)
RDW: 13.7 % (ref 11.6–15.4)
WBC: 5.1 10*3/uL (ref 3.4–10.8)

## 2022-08-20 ENCOUNTER — Other Ambulatory Visit: Payer: Self-pay

## 2022-10-08 ENCOUNTER — Other Ambulatory Visit: Payer: Self-pay

## 2022-10-29 ENCOUNTER — Other Ambulatory Visit: Payer: Self-pay

## 2022-10-29 ENCOUNTER — Emergency Department (HOSPITAL_COMMUNITY): Payer: Medicaid Other

## 2022-10-29 ENCOUNTER — Encounter (HOSPITAL_COMMUNITY): Payer: Self-pay | Admitting: Emergency Medicine

## 2022-10-29 ENCOUNTER — Emergency Department (HOSPITAL_COMMUNITY)
Admission: EM | Admit: 2022-10-29 | Discharge: 2022-10-29 | Disposition: A | Discharge status: Home / Self Care | Payer: Medicaid Other | Attending: Emergency Medicine | Admitting: Emergency Medicine

## 2022-10-29 DIAGNOSIS — R519 Headache, unspecified: Secondary | ICD-10-CM | POA: Insufficient documentation

## 2022-10-29 DIAGNOSIS — G40909 Epilepsy, unspecified, not intractable, without status epilepticus: Secondary | ICD-10-CM

## 2022-10-29 DIAGNOSIS — R000 Tachycardia, unspecified: Secondary | ICD-10-CM | POA: Diagnosis not present

## 2022-10-29 DIAGNOSIS — R569 Unspecified convulsions: Secondary | ICD-10-CM | POA: Insufficient documentation

## 2022-10-29 LAB — CBC WITH DIFFERENTIAL/PLATELET
Abs Immature Granulocytes: 0.24 10*3/uL — ABNORMAL HIGH (ref 0.00–0.07)
Basophils Absolute: 0.1 10*3/uL (ref 0.0–0.1)
Basophils Relative: 1 %
Eosinophils Absolute: 0.2 10*3/uL (ref 0.0–0.5)
Eosinophils Relative: 2 %
HCT: 54.8 % — ABNORMAL HIGH (ref 39.0–52.0)
Hemoglobin: 17.2 g/dL — ABNORMAL HIGH (ref 13.0–17.0)
Immature Granulocytes: 3 %
Lymphocytes Relative: 54 %
Lymphs Abs: 4.6 10*3/uL — ABNORMAL HIGH (ref 0.7–4.0)
MCH: 27.3 pg (ref 26.0–34.0)
MCHC: 31.4 g/dL (ref 30.0–36.0)
MCV: 86.8 fL (ref 80.0–100.0)
Monocytes Absolute: 0.6 10*3/uL (ref 0.1–1.0)
Monocytes Relative: 8 %
Neutro Abs: 2.7 10*3/uL (ref 1.7–7.7)
Neutrophils Relative %: 32 %
Platelets: 239 10*3/uL (ref 150–400)
RBC: 6.31 MIL/uL — ABNORMAL HIGH (ref 4.22–5.81)
RDW: 13.9 % (ref 11.5–15.5)
WBC: 8.4 10*3/uL (ref 4.0–10.5)
nRBC: 0 % (ref 0.0–0.2)

## 2022-10-29 LAB — BASIC METABOLIC PANEL
Anion gap: 28 — ABNORMAL HIGH (ref 5–15)
BUN: 11 mg/dL (ref 6–20)
CO2: 13 mmol/L — ABNORMAL LOW (ref 22–32)
Calcium: 9.6 mg/dL (ref 8.9–10.3)
Chloride: 96 mmol/L — ABNORMAL LOW (ref 98–111)
Creatinine, Ser: 1.68 mg/dL — ABNORMAL HIGH (ref 0.61–1.24)
GFR, Estimated: 49 mL/min — ABNORMAL LOW (ref >60)
Glucose, Bld: 167 mg/dL — ABNORMAL HIGH (ref 70–99)
Potassium: 3.8 mmol/L (ref 3.5–5.1)
Sodium: 137 mmol/L (ref 135–145)

## 2022-10-29 MED ORDER — LEVETIRACETAM IN NACL 1000 MG/100ML IV SOLN
1000.0000 mg | Freq: Once | INTRAVENOUS | Status: AC
Start: 2022-10-29 — End: 2022-10-29
  Administered 2022-10-29: 1000 mg via INTRAVENOUS
  Filled 2022-10-29: qty 100

## 2022-10-29 MED ORDER — LEVETIRACETAM 500 MG PO TABS
500.0000 mg | ORAL_TABLET | Freq: Two times a day (BID) | ORAL | 5 refills | Status: DC
  Filled 2022-10-29: qty 60, 30d supply, fill #0
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
## 2022-10-29 NOTE — Discharge Instructions (Signed)
It is very important that you are compliant with your Keppra to avoid breakthrough seizures.  You were given a loading dose here in the department to the IV, continue your regimen tonight.  A refill has been sent as well.

## 2022-10-29 NOTE — ED Notes (Signed)
Steven Higgins [phone] to be contacted when patient is discharged.

## 2022-10-29 NOTE — ED Notes (Signed)
Phlebotomy to collect levetiracetam level
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
## 2022-10-29 NOTE — ED Notes (Signed)
Patient transported to CT 

## 2022-10-29 NOTE — ED Provider Notes (Signed)
Received patient in turnover from Dr. Wilkie Aye.  Please see their note for further details of Hx, PE.  Briefly patient is a 52 y.o. male with a Seizures .  Patient with likely a breaththrough seizure.  Being loaded on keppra.  Awaiting CT head.  CT head with concern for increased calcification of R MCA.  Patient reassessed back to baseline.  No weakness.  He would like to go home at this time.     Melene Plan, DO 10/29/22 [phone]

## 2022-10-29 NOTE — ED Notes (Signed)
This nurse has called the number listed on chart under [place] 10 times. This nurse has left 4 messages. This nurse has also attempted to contact the designated party release phone number which states that we are able to leave a detailed message on the voicemail, however, the voicemail has not been set up to that number

## 2022-10-30 ENCOUNTER — Other Ambulatory Visit: Payer: Self-pay

## 2022-10-30 ENCOUNTER — Other Ambulatory Visit: Payer: Self-pay | Admitting: Nurse Practitioner

## 2022-10-30 DIAGNOSIS — I10 Essential (primary) hypertension: Secondary | ICD-10-CM

## 2022-10-30 LAB — LEVETIRACETAM LEVEL: Levetiracetam Lvl: 13.8 ug/mL (ref 10.0–40.0)

## 2022-10-30 NOTE — Telephone Encounter (Signed)
Please advise K[facility] 

## 2022-10-31 ENCOUNTER — Other Ambulatory Visit: Payer: Self-pay

## 2022-10-31 MED ORDER — NICOTINE 14 MG/24HR TD PT24
14.0000 mg | MEDICATED_PATCH | Freq: Every day | TRANSDERMAL | 0 refills | Status: DC
  Filled 2022-10-31: qty 28, 28d supply, fill #0

## 2022-10-31 MED ORDER — HYDRALAZINE HCL 25 MG PO TABS
37.5000 mg | ORAL_TABLET | Freq: Three times a day (TID) | ORAL | 5 refills | Status: DC
  Filled 2022-10-31: qty 135, 30d supply, fill #0
  Filled 2022-12-12: qty 135, 30d supply, fill #1
  Filled 2023-04-24: qty 135, 30d supply, fill #2

## 2022-11-07 ENCOUNTER — Other Ambulatory Visit: Payer: Self-pay

## 2022-11-14 ENCOUNTER — Other Ambulatory Visit: Payer: Self-pay

## 2022-11-14 ENCOUNTER — Ambulatory Visit: Payer: Medicaid Other | Admitting: Adult Health

## 2022-11-14 ENCOUNTER — Ambulatory Visit (INDEPENDENT_AMBULATORY_CARE_PROVIDER_SITE_OTHER): Payer: MEDICAID | Admitting: Adult Health

## 2022-11-14 ENCOUNTER — Encounter: Payer: Self-pay | Admitting: Adult Health

## 2022-11-14 VITALS — BP 158/82 | HR 88 | Ht 78.0 in | Wt 217.0 lb

## 2022-11-14 DIAGNOSIS — I611 Nontraumatic intracerebral hemorrhage in hemisphere, cortical: Secondary | ICD-10-CM | POA: Diagnosis not present

## 2022-11-14 DIAGNOSIS — G40909 Epilepsy, unspecified, not intractable, without status epilepticus: Secondary | ICD-10-CM | POA: Diagnosis not present

## 2022-11-14 DIAGNOSIS — I10 Essential (primary) hypertension: Secondary | ICD-10-CM | POA: Diagnosis not present

## 2022-11-14 DIAGNOSIS — E785 Hyperlipidemia, unspecified: Secondary | ICD-10-CM

## 2022-11-14 DIAGNOSIS — I639 Cerebral infarction, unspecified: Secondary | ICD-10-CM | POA: Diagnosis not present

## 2022-11-14 MED ORDER — ASPIRIN 81 MG PO TBEC
81.0000 mg | DELAYED_RELEASE_TABLET | Freq: Every day | ORAL | 12 refills | Status: DC
  Filled 2022-11-14: qty 30, 30d supply, fill #0
  Filled 2022-12-12: qty 30, 30d supply, fill #1

## 2022-11-14 MED ORDER — LEVETIRACETAM ER 500 MG PO TB24
1000.0000 mg | ORAL_TABLET | Freq: Every day | ORAL | 5 refills | Status: DC
  Filled 2022-11-14: qty 30, 15d supply, fill #0
  Filled 2022-12-12: qty 30, 15d supply, fill #1
  Filled 2023-01-15: qty 30, 15d supply, fill #2
  Filled 2023-02-05 (×2): qty 30, 15d supply, fill #3
  Filled 2023-03-01: qty 30, 15d supply, fill #4
  Filled 2023-03-22 (×2): qty 30, 15d supply, fill #5

## 2022-11-14 MED ORDER — ATORVASTATIN CALCIUM 20 MG PO TABS
20.0000 mg | ORAL_TABLET | Freq: Every evening | ORAL | 3 refills | Status: DC
  Filled 2022-11-14: qty 30, 30d supply, fill #0
  Filled 2022-12-12: qty 30, 30d supply, fill #1
  Filled 2023-02-05: qty 30, 30d supply, fill #2
  Filled 2023-03-22: qty 30, 30d supply, fill #3

## 2022-11-14 MED ORDER — CARVEDILOL 6.25 MG PO TABS
6.2500 mg | ORAL_TABLET | Freq: Two times a day (BID) | ORAL | 4 refills | Status: DC
  Filled 2022-11-14: qty 60, 30d supply, fill #0
  Filled 2022-12-12: qty 60, 30d supply, fill #1
  Filled 2023-02-22: qty 60, 30d supply, fill #2
  Filled 2023-04-24: qty 60, 30d supply, fill #3
  Filled 2023-08-29: qty 60, 30d supply, fill #4
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
## 2022-11-14 NOTE — Patient Instructions (Addendum)
Your Plan:  Recommend trying Keppra XR (extended release form) 1000mg  nightly (you will take 2 500mg  tablets) - please ensure you do not miss any dosages as this will increase your risk of seizures. If you continue to have side effects or medication too expensive, please let me know   No driving for 6 months after seizure activity per [place] law  Please call with any additional seizure activity   Restart aspirin 81 mg daily and atorvastatin 20 mg daily for secondary stroke prevention measures     Follow-up in 6 months or call earlier if needed     Thank you for coming to see [place] at [facility]. I hope we have been able to provide you high quality care today.  You may receive a patient satisfaction survey over the next few weeks. We would appreciate your feedback and comments so that we may continue to improve ourselves and the health of our patients.

## 2022-11-16 ENCOUNTER — Other Ambulatory Visit: Payer: Self-pay

## 2022-11-26 ENCOUNTER — Telehealth: Payer: Self-pay | Admitting: Clinical

## 2022-11-26 NOTE — Telephone Encounter (Addendum)
Integrated Behavioral Health Progress Note  11/26/2022 Name: Steven Higgins MRN: [id number] DOB: [birth year] Steven Higgins is a 52 y.o. year old male who sees Ivonne Andrew, NP for primary care. LCSW was initially consulted to assess patient needs and assist with community resources.  Interpreter: No.   Interpreter Name & Language: none  Assessment: Patient experiencing homelessness and financial difficulties related to no income.  Ongoing Intervention: Patient called and indicated he needs a referral for the pallet homes that the [facility]) is providing this winter for people experiencing homelessness. He has spoken to a case manager at the [facility], and they have requested a referral, but patient is not sure what the referral should be (written letter, fill out a form, etc). He is to call back after finding out from his [facility] case worker. CSW advised that we can complete the referral.   Abigail Butts, LCSW [facility] [phone]

## 2022-12-12 ENCOUNTER — Other Ambulatory Visit: Payer: Self-pay

## 2022-12-14 ENCOUNTER — Other Ambulatory Visit: Payer: Self-pay

## 2023-01-16 ENCOUNTER — Other Ambulatory Visit: Payer: Self-pay

## 2023-02-05 ENCOUNTER — Other Ambulatory Visit: Payer: Self-pay

## 2023-02-14 ENCOUNTER — Encounter: Payer: Self-pay | Admitting: Nurse Practitioner

## 2023-02-14 ENCOUNTER — Ambulatory Visit (INDEPENDENT_AMBULATORY_CARE_PROVIDER_SITE_OTHER): Payer: MEDICAID | Admitting: Nurse Practitioner

## 2023-02-14 VITALS — BP 155/82 | HR 81 | Temp 97.9°F | Wt 225.4 lb

## 2023-02-14 DIAGNOSIS — E1169 Type 2 diabetes mellitus with other specified complication: Secondary | ICD-10-CM | POA: Diagnosis not present

## 2023-02-14 LAB — POCT GLYCOSYLATED HEMOGLOBIN (HGB A1C): Hemoglobin A1C: 6.3 % — AB (ref 4.0–5.6)

## 2023-02-14 NOTE — Progress Notes (Signed)
Subjective   Patient ID: Steven Higgins, male    DOB: [birth year], 53 y.o.   MRN: [id number]  Chief Complaint  Patient presents with   Diabetes    Follow up    Referring provider: Ivonne Andrew, NP  Hermelinda Dellen is a 53 y.o. male with Past Medical History: 01/22/2017: CAD (coronary artery disease), native coronary artery     Comment:  coronary Ca score of 143 and mild nonobstructive plaque               in the RCA that is tortuous with aneurysmal dilatation               and minimal plaque in the LAD and LCx by coronary CTA               07/2016 04/04/2016: Carotid artery stenosis     Comment:  1-39% bilateral by dopplers 03/2016 No date: Diabetes mellitus (HCC) No date: Ectatic abdominal aorta (HCC)     Comment:  a. f/u due 2024. 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 EF 40-45% by echo 03/2016 presumed due to HTN - nuc               with no ischemia, nonobstructive CAD by cor CT 07/2016. No date: Primary hyperaldosteronism (HCC) 04/04/2016: Renal artery stenosis (HCC)     Comment:  By renal dopplers 03/2016 1-59% but not seen on CT abd               02/2017 No date: Seizure disorder [facility])     Comment:  as sequela of small intracerebral bleed   HPI  Steven Higgins is a 53 y.o. year old male patient with a history of type 2 diabetes mellitus, hypertension, hyperlipidemia.  Hyperaldosteronism, seizures.   Hypertension:   Patient presents for a follow up. States that he checks blood pressure at home. States that blood pressures have been fluctuating. He is compliant with medications. Does need refills. states meds make him feel bad.  Blood pressure today in office was elevated.  Patient stated that he took his blood pressure medicine right before coming into the office and it may have not had enough  time to take effect.     Diabetes:   Patient states that he checks his blood sugar at home and it is usually within normal range. Compliant with medications. blood sugars in normal range. A1C today is 6.2.     Allergies  Allergen Reactions   Bee Pollen     unknown   Lisinopril Cough    Immunization History  Administered Date(s) Administered   Tdap 11/19/2020    Tobacco History: Social History   Tobacco Use  Smoking Status Every Day   Current packs/day: 0.25   Types: Cigarettes  Smokeless Tobacco Never  Tobacco Comments   07/30/16 smoke 3-4 cigarettes per day-not wearing patch   Ready to quit: Yes Counseling given: Yes Tobacco comments: 07/30/16 smoke 3-4 cigarettes per day-not wearing patch   Outpatient Encounter Medications as of 02/14/2023  Medication Sig   aspirin EC 81 MG tablet Take 1 tablet (81 mg total) by mouth daily. Swallow whole.   atorvastatin (LIPITOR) 20 MG tablet Take 1 tablet (20 mg total) by mouth every evening.   carvedilol (COREG) 6.25 MG tablet  Take 1 tablet (6.25 mg total) by mouth 2 (two) times daily with a meal.   hydrALAZINE (APRESOLINE) 25 MG tablet Take 1.5 tablets (37.5 mg total) by mouth 3 (three) times daily.   isosorbide mononitrate (IMDUR) 60 MG 24 hr tablet Take 1 tablet (60 mg total) by mouth daily.   levETIRAcetam (KEPPRA XR) 500 MG 24 hr tablet Take 2 tablets (1,000 mg total) by mouth at bedtime.   lidocaine (LIDODERM) 5 % Place 1 patch onto the skin daily. Remove & Discard patch within 12 hours or as directed by MD   metFORMIN (GLUCOPHAGE) 500 MG tablet Take 1 tablet (500 mg total) by mouth 2 (two) times daily with a meal.   nicotine (NICODERM CQ - DOSED IN MG/24 HOURS) 21 mg/24hr patch Place 1 patch (21 mg total) onto the skin daily.   No facility-administered encounter medications on file as of 02/14/2023.    Review of Systems  Review of Systems  Constitutional: Negative.   HENT: Negative.    Cardiovascular: Negative.    Gastrointestinal: Negative.   Allergic/Immunologic: Negative.   Neurological: Negative.   Psychiatric/Behavioral: Negative.       Objective:   BP (!) 155/82   Pulse 81   Temp 97.9 F (36.6 C)   Wt 225 lb 6.4 oz (102.2 kg)   SpO2 95%   BMI 26.05 kg/m   Wt Readings from Last 5 Encounters:  02/14/23 225 lb 6.4 oz (102.2 kg)  11/14/22 217 lb (98.4 kg)  10/29/22 215 lb (97.5 kg)  08/15/22 210 lb (95.3 kg)  05/03/22 211 lb 9.6 oz (96 kg)     Physical Exam Vitals and nursing note reviewed.  Constitutional:      General: He is not in acute distress.    Appearance: He is well-developed.  Cardiovascular:     Rate and Rhythm: Normal rate and regular rhythm.  Pulmonary:     Effort: Pulmonary effort is normal.     Breath sounds: Normal breath sounds.  Skin:    General: Skin is warm and dry.  Neurological:     Mental Status: He is alert and oriented to person, place, and time.       Assessment & Plan:   Type 2 diabetes mellitus with other specified complication, without long-term current use of insulin (HCC) -     POCT glycosylated hemoglobin (Hb A1C)     Return in about 3 months (around 05/14/2023).   Ivonne Andrew, NP 02/14/2023

## 2023-02-14 NOTE — Patient Instructions (Signed)
1. Type 2 diabetes mellitus with other specified complication, without long-term current use of insulin (HCC) (Primary)  - POCT glycosylated hemoglobin (Hb A1C)   Follow up:  Follow up in 3 months

## 2023-02-26 ENCOUNTER — Other Ambulatory Visit: Payer: Self-pay

## 2023-03-01 ENCOUNTER — Other Ambulatory Visit: Payer: Self-pay

## 2023-03-22 ENCOUNTER — Other Ambulatory Visit: Payer: Self-pay

## 2023-04-10 ENCOUNTER — Other Ambulatory Visit: Payer: Self-pay

## 2023-04-24 ENCOUNTER — Other Ambulatory Visit: Payer: Self-pay

## 2023-04-24 ENCOUNTER — Other Ambulatory Visit: Payer: Self-pay | Admitting: Adult Health

## 2023-04-24 MED ORDER — LEVETIRACETAM ER 500 MG PO TB24
1000.0000 mg | ORAL_TABLET | Freq: Every day | ORAL | 5 refills | Status: DC
  Filled 2023-04-24: qty 30, 15d supply, fill #0
  Filled 2023-05-31: qty 30, 15d supply, fill #1
  Filled 2023-08-12: qty 30, 15d supply, fill #2
  Filled 2023-09-11: qty 30, 15d supply, fill #3
  Filled 2023-10-11: qty 30, 15d supply, fill #4
  Filled 2023-10-23: qty 30, 15d supply, fill #5

## 2023-04-25 ENCOUNTER — Other Ambulatory Visit: Payer: Self-pay

## 2023-05-09 ENCOUNTER — Other Ambulatory Visit: Payer: Self-pay

## 2023-05-09 ENCOUNTER — Other Ambulatory Visit: Payer: Self-pay | Admitting: Nurse Practitioner

## 2023-05-09 DIAGNOSIS — E785 Hyperlipidemia, unspecified: Secondary | ICD-10-CM

## 2023-05-09 MED ORDER — ATORVASTATIN CALCIUM 20 MG PO TABS
20.0000 mg | ORAL_TABLET | Freq: Every evening | ORAL | 3 refills | Status: DC
  Filled 2023-05-09: qty 30, 30d supply, fill #0
  Filled 2023-10-23: qty 30, 30d supply, fill #1

## 2023-05-29 ENCOUNTER — Ambulatory Visit: Payer: MEDICAID | Admitting: Adult Health

## 2023-05-31 ENCOUNTER — Other Ambulatory Visit: Payer: Self-pay

## 2023-08-02 ENCOUNTER — Other Ambulatory Visit: Payer: Self-pay

## 2023-08-06 ENCOUNTER — Telehealth: Payer: Self-pay | Admitting: Adult Health

## 2023-08-06 ENCOUNTER — Ambulatory Visit: Payer: Self-pay | Admitting: Family Medicine

## 2023-08-06 ENCOUNTER — Encounter: Payer: Self-pay | Admitting: Family Medicine

## 2023-08-06 VITALS — BP 162/102 | HR 72 | Temp 98.1°F | Resp 16 | Ht 78.5 in | Wt 225.5 lb

## 2023-08-06 DIAGNOSIS — Z719 Counseling, unspecified: Secondary | ICD-10-CM

## 2023-08-06 LAB — GLUCOSE, POCT (MANUAL RESULT ENTRY): POC Glucose: 87 mg/dL (ref 70–99)

## 2023-08-06 NOTE — Progress Notes (Signed)
 Pt here for evaluation, has a history of hypertension. BP 162/102 today. Pt states that this is his normal blood pressure for him. Pt states has appointment with cardiologist tomorrow 08/07/2023 at 1:45 PM.

## 2023-08-06 NOTE — Telephone Encounter (Signed)
 MYC conf

## 2023-08-06 NOTE — Progress Notes (Signed)
 Nursing Intake Note Paediatric nurse Health  Chief Complaint: Hypertension  Living Situation: Unhoused     New Patient Status:  [x]  New to [employer]  Privacy practices reviewed  HIPAA form signed and documented  Consent for digital charting: [x]  Signed []  Not Signed  Additional Notes:  Vital signs taken and entered in flowsheet  Interpreter services: []  Needed [x]  Not Needed  Patient oriented to mobile clinic services and process  SDOH screening completed  Referral to provider: [x]  Needed []  Not Needed  RN Interventions Provided:  [x]  Health education (e.g., chronic disease, hygiene, nutrition)  []  Wound care performed  []  Flu vaccine administered  [x]  Other: Blanket provided, socks

## 2023-08-07 ENCOUNTER — Encounter: Payer: Self-pay | Admitting: Adult Health

## 2023-08-07 ENCOUNTER — Ambulatory Visit: Payer: MEDICAID | Admitting: Adult Health
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
## 2023-08-12 ENCOUNTER — Other Ambulatory Visit: Payer: Self-pay

## 2023-08-12 ENCOUNTER — Other Ambulatory Visit (HOSPITAL_COMMUNITY): Payer: Self-pay

## 2023-08-12 ENCOUNTER — Encounter: Payer: Self-pay | Admitting: Family Medicine

## 2023-08-12 NOTE — Progress Notes (Signed)
 Co-signed.

## 2023-08-13 ENCOUNTER — Ambulatory Visit: Payer: Self-pay | Admitting: Nurse Practitioner

## 2023-08-14 ENCOUNTER — Ambulatory Visit: Payer: Self-pay | Admitting: Nurse Practitioner

## 2023-08-28 ENCOUNTER — Ambulatory Visit: Payer: Self-pay | Admitting: Nurse Practitioner

## 2023-08-30 ENCOUNTER — Other Ambulatory Visit: Payer: Self-pay

## 2023-09-03 ENCOUNTER — Ambulatory Visit: Payer: Self-pay | Admitting: Nurse Practitioner

## 2023-09-03 NOTE — Telephone Encounter (Signed)
 FYI Only or Action Required?: FYI only for provider.  Patient was last seen in primary care on 02/14/2023 by [name], NP.  Called Nurse Triage reporting Hypertension.  Symptoms began a week ago.  Triage Disposition: See Physician Within 24 Hours  Patient/caregiver understands and will follow disposition?: Yes      Copied from CRM #[id number]. Topic: Clinical - Red Word Triage >> Sep 03, 2023  2:17 PM Fonda T wrote: [facility] that prompted transfer to Nurse Triage: Received call from patient, reports he is having increased blood pressure readings, most recent is 190/109, also patient reports having back pain. Reason for Disposition  Systolic BP >= 180 OR Diastolic >= 110  Answer Assessment - Initial Assessment Questions This RN scheduled pt an appointment tomorrow with PCP in office. This RN educated pt on new-worsening symptoms and when to call back/seek emergent care. Pt verbalized understanding and agrees to plan.     BLOOD PRESSURE: What is your blood pressure? Did you take at least two measurements 5 minutes apart?     192/109; running higher for the past week  ONSET: When did you take your blood pressure?     This morning  HOW: How did you take your blood pressure? (e.g., automatic home BP monitor, visiting nurse)     Automatic home BP   HISTORY: Do you have a history of high blood pressure?     Yes  MEDICINES: Are you taking any medicines for blood pressure? Have you missed any doses recently?     Yes; has not missed any doses  OTHER SYMPTOMS: Do you have any symptoms? (e.g., blurred vision, chest pain, difficulty breathing, headache, weakness)     Denies difficulty breathing or chest pain, headache, blurred vision  Lower back pain and neck pain- chronic issue (5/10 pain level, intermittent); pt thinks he has been sleeping on it wrong; pt is sleeping in his car  Protocols used: Blood Pressure - High-A-AH

## 2023-09-04 ENCOUNTER — Ambulatory Visit: Payer: Self-pay | Admitting: Nurse Practitioner

## 2023-09-11 ENCOUNTER — Other Ambulatory Visit: Payer: Self-pay

## 2023-10-03 ENCOUNTER — Encounter: Payer: Self-pay | Admitting: Cardiology

## 2023-10-03 ENCOUNTER — Other Ambulatory Visit: Payer: Self-pay

## 2023-10-03 ENCOUNTER — Encounter: Payer: Self-pay | Admitting: Nurse Practitioner

## 2023-10-03 ENCOUNTER — Ambulatory Visit: Payer: MEDICAID | Attending: Cardiology | Admitting: Cardiology

## 2023-10-03 ENCOUNTER — Ambulatory Visit (INDEPENDENT_AMBULATORY_CARE_PROVIDER_SITE_OTHER): Payer: MEDICAID | Admitting: Nurse Practitioner

## 2023-10-03 VITALS — BP 158/100 | HR 127 | Ht 78.5 in | Wt 224.0 lb

## 2023-10-03 VITALS — BP 151/89 | HR 132 | Ht 78.5 in | Wt 224.0 lb

## 2023-10-03 DIAGNOSIS — I10 Essential (primary) hypertension: Secondary | ICD-10-CM

## 2023-10-03 DIAGNOSIS — R42 Dizziness and giddiness: Secondary | ICD-10-CM | POA: Diagnosis not present

## 2023-10-03 DIAGNOSIS — Z13220 Encounter for screening for lipoid disorders: Secondary | ICD-10-CM

## 2023-10-03 DIAGNOSIS — I132 Hypertensive heart and chronic kidney disease with heart failure and with stage 5 chronic kidney disease, or end stage renal disease: Secondary | ICD-10-CM | POA: Insufficient documentation

## 2023-10-03 DIAGNOSIS — I119 Hypertensive heart disease without heart failure: Secondary | ICD-10-CM | POA: Insufficient documentation

## 2023-10-03 DIAGNOSIS — I130 Hypertensive heart and chronic kidney disease with heart failure and stage 1 through stage 4 chronic kidney disease, or unspecified chronic kidney disease: Secondary | ICD-10-CM | POA: Insufficient documentation

## 2023-10-03 DIAGNOSIS — I428 Other cardiomyopathies: Secondary | ICD-10-CM | POA: Diagnosis present

## 2023-10-03 DIAGNOSIS — I2510 Atherosclerotic heart disease of native coronary artery without angina pectoris: Secondary | ICD-10-CM | POA: Diagnosis present

## 2023-10-03 DIAGNOSIS — I43 Cardiomyopathy in diseases classified elsewhere: Secondary | ICD-10-CM | POA: Insufficient documentation

## 2023-10-03 DIAGNOSIS — R000 Tachycardia, unspecified: Secondary | ICD-10-CM | POA: Diagnosis not present

## 2023-10-03 DIAGNOSIS — H547 Unspecified visual loss: Secondary | ICD-10-CM

## 2023-10-03 DIAGNOSIS — N186 End stage renal disease: Secondary | ICD-10-CM | POA: Diagnosis present

## 2023-10-03 DIAGNOSIS — E1169 Type 2 diabetes mellitus with other specified complication: Secondary | ICD-10-CM | POA: Diagnosis not present

## 2023-10-03 MED ORDER — ISOSORBIDE MONONITRATE ER 60 MG PO TB24
60.0000 mg | ORAL_TABLET | Freq: Every day | ORAL | 5 refills | Status: DC
  Filled 2023-10-03: qty 30, 30d supply, fill #0

## 2023-10-03 MED ORDER — HYDRALAZINE HCL 25 MG PO TABS
37.5000 mg | ORAL_TABLET | Freq: Three times a day (TID) | ORAL | 5 refills | Status: DC
  Filled 2023-10-03: qty 135, 30d supply, fill #0

## 2023-10-03 MED ORDER — METFORMIN HCL 500 MG PO TABS
500.0000 mg | ORAL_TABLET | Freq: Two times a day (BID) | ORAL | 3 refills | Status: AC
  Filled 2023-10-03: qty 180, 90d supply, fill #0
  Filled 2024-01-14: qty 180, 90d supply, fill #1

## 2023-10-03 MED ORDER — CARVEDILOL 12.5 MG PO TABS
12.5000 mg | ORAL_TABLET | Freq: Two times a day (BID) | ORAL | 3 refills | Status: DC
  Filled 2023-10-03: qty 180, 90d supply, fill #0
  Filled 2023-10-23: qty 180, 90d supply, fill #1

## 2023-10-03 MED ORDER — CARVEDILOL 6.25 MG PO TABS
6.2500 mg | ORAL_TABLET | Freq: Two times a day (BID) | ORAL | 4 refills | Status: DC
  Filled 2023-10-03: qty 60, 30d supply, fill #0

## 2023-10-03 NOTE — Progress Notes (Signed)
 Subjective   Patient ID: Steven Higgins, male    DOB: 06/21/1970, 53 y.o.   MRN: [id number]  Chief Complaint  Patient presents with   Hypertension    Elevated home BP readings, headaches, blurry vision    Referring provider: [name], NP  Steven Higgins Latour is a 53 y.o. male with Past Medical History: 01/22/2017: CAD (coronary artery disease), native coronary artery     Comment:  coronary Ca score of 143 and mild nonobstructive plaque               in the RCA that is tortuous with aneurysmal dilatation               and minimal plaque in the LAD and LCx by coronary CTA               07/2016 04/04/2016: Carotid artery stenosis     Comment:  1-39% bilateral by dopplers 03/2016 No date: Diabetes mellitus (HCC) No date: Ectatic abdominal aorta     Comment:  a. f/u due 2024. 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 EF 40-45% by echo 03/2016 presumed due to HTN - nuc               with no ischemia, nonobstructive CAD by cor CT 07/2016. No date: Primary hyperaldosteronism 04/04/2016: Renal artery stenosis     Comment:  By renal dopplers 03/2016 1-59% but not seen on CT abd               02/2017 No date: Seizure disorder [facility])     Comment:  as sequela of small intracerebral bleed   HPI  Patient presents today for a follow-up visit.  Patient states that he has been having recent dizzy spells and has to lay down at times because he is so dizzy.  His blood pressure has been elevated and heart rate has been racing.  EKG in office today was abnormal. Consulted with Dr. Sheena and appointment was made for 3:00 today.  Patient states that he does have someone to transport him to the appointment today.  He is requesting a referral to to the eye doctor for decreased vision.  He does need refills on medications.  We will  check labs today. Denies f/c/s, n/v/d, hemoptysis, PND, leg swelling Denies chest pain or edema      Allergies  Allergen Reactions   Bee Pollen     unknown   Lisinopril  Cough    Immunization History  Administered Date(s) Administered   Tdap 11/19/2020    Tobacco History: Social History   Tobacco Use  Smoking Status Every Day   Current packs/day: 0.25   Types: Cigarettes  Smokeless Tobacco Never  Tobacco Comments   07/30/16 smoke 3-4 cigarettes per day-not wearing patch   Ready to quit: Not Answered Counseling given: Not Answered Tobacco comments: 07/30/16 smoke 3-4 cigarettes per day-not wearing patch   Outpatient Encounter Medications as of 10/03/2023  Medication Sig   atorvastatin  (LIPITOR) 20 MG tablet Take 1 tablet (20 mg total) by mouth every evening.   levETIRAcetam  (KEPPRA  XR) 500 MG 24 hr tablet Take 2 tablets (1,000 mg total) by mouth at bedtime.   [DISCONTINUED] carvedilol  (COREG ) 6.25 MG tablet Take 1 tablet (6.25 mg total) by mouth 2 (two) times  daily with a meal.   [DISCONTINUED] hydrALAZINE  (APRESOLINE ) 25 MG tablet Take 1.5 tablets (37.5 mg total) by mouth 3 (three) times daily.   [DISCONTINUED] metFORMIN  (GLUCOPHAGE ) 500 MG tablet Take 1 tablet (500 mg total) by mouth 2 (two) times daily with a meal.   aspirin  EC 81 MG tablet Take 1 tablet (81 mg total) by mouth daily. Swallow whole. (Patient not taking: Reported on 10/03/2023)   carvedilol  (COREG ) 6.25 MG tablet Take 1 tablet (6.25 mg total) by mouth 2 (two) times daily with a meal.   hydrALAZINE  (APRESOLINE ) 25 MG tablet Take 1.5 tablets (37.5 mg total) by mouth 3 (three) times daily.   isosorbide  mononitrate (IMDUR ) 60 MG 24 hr tablet Take 1 tablet (60 mg total) by mouth daily.   lidocaine  (LIDODERM ) 5 % Place 1 patch onto the skin daily. Remove & Discard patch within 12 hours or as directed by MD (Patient not taking: Reported on 08/06/2023)   metFORMIN  (GLUCOPHAGE ) 500 MG tablet Take 1 tablet (500 mg  total) by mouth 2 (two) times daily with a meal.   nicotine  (NICODERM CQ  - DOSED IN MG/24 HOURS) 21 mg/24hr patch Place 1 patch (21 mg total) onto the skin daily. (Patient not taking: Reported on 10/03/2023)   [DISCONTINUED] isosorbide  mononitrate (IMDUR ) 60 MG 24 hr tablet Take 1 tablet (60 mg total) by mouth daily. (Patient not taking: Reported on 08/06/2023)   No facility-administered encounter medications on file as of 10/03/2023.    Review of Systems  Review of Systems  Constitutional: Negative.   HENT: Negative.    Cardiovascular:  Positive for palpitations.  Gastrointestinal: Negative.   Allergic/Immunologic: Negative.   Neurological: Negative.   Psychiatric/Behavioral: Negative.       Objective:   BP (!) 151/89   Pulse (!) 132   Ht 6' 6.5 (1.994 m)   Wt 224 lb (101.6 kg)   SpO2 99%   BMI 25.56 kg/m   Wt Readings from Last 5 Encounters:  10/03/23 224 lb (101.6 kg)  08/06/23 225 lb 8 oz (102.3 kg)  02/14/23 225 lb 6.4 oz (102.2 kg)  11/14/22 217 lb (98.4 kg)  10/29/22 215 lb (97.5 kg)     Physical Exam Vitals and nursing note reviewed.  Constitutional:      General: He is not in acute distress.    Appearance: He is well-developed.  Cardiovascular:     Rate and Rhythm: Regular rhythm. Tachycardia present.  Pulmonary:     Effort: Pulmonary effort is normal.     Breath sounds: Normal breath sounds.  Skin:    General: Skin is warm and dry.  Neurological:     Mental Status: He is alert and oriented to person, place, and time.       Assessment & Plan:   Lipid screening -     Lipid panel  Hypertension, unspecified type -     Carvedilol ; Take 1 tablet (6.25 mg total) by mouth 2 (two) times daily with a meal.  Dispense: 60 tablet; Refill: 4 -     hydrALAZINE  HCl; Take 1.5 tablets (37.5 mg total) by mouth 3 (three) times daily.  Dispense: 135 tablet; Refill: 5 -     Isosorbide  Mononitrate ER; Take 1 tablet (60 mg total) by mouth daily.  Dispense: 30 tablet;  Refill: 5 -     CBC -     Comprehensive metabolic panel with GFR -     AMB Referral VBCI Care Management -     EKG 12-Lead -  ECHOCARDIOGRAM COMPLETE; Future  Type 2 diabetes mellitus with other specified complication, without long-term current use of insulin  (HCC) -     metFORMIN  HCl; Take 1 tablet (500 mg total) by mouth 2 (two) times daily with a meal.  Dispense: 180 tablet; Refill: 3  Decreased vision -     Ambulatory referral to Optometry  Tachycardia -     Ambulatory referral to Cardiology -     EKG 12-Lead -     ECHOCARDIOGRAM COMPLETE; Future  Dizziness -     Ambulatory referral to Cardiology -     EKG 12-Lead     Return in about 3 months (around 01/03/2024).   Bascom GORMAN Borer, NP 10/03/2023

## 2023-10-03 NOTE — Patient Instructions (Addendum)
 Medication Instructions:  Your physician has recommended you make the following change in your medication:  INCREASE: Coreg  12.5 mg twice daily  *If you need a refill on your cardiac medications before your next appointment, please call your pharmacy*   Testing/Procedures: Your physician has requested that you have an echocardiogram. Echocardiography is a painless test that uses sound waves to create images of your heart. It provides your doctor with information about the size and shape of your heart and how well your heart's chambers and valves are working. This procedure takes approximately one hour. There are no restrictions for this procedure. Please do NOT wear cologne, perfume, aftershave, or lotions (deodorant is allowed). Please arrive 15 minutes prior to your appointment time.  Please note: We ask at that you not bring children with you during ultrasound (echo/ vascular) testing. Due to room size and safety concerns, children are not allowed in the ultrasound rooms during exams. Our front office staff cannot provide observation of children in our lobby area while testing is being conducted. An adult accompanying a patient to their appointment will only be allowed in the ultrasound room at the discretion of the ultrasound technician under special circumstances. We apologize for any inconvenience.   Follow-Up: At [facility], you and your health needs are our priority.  As part of our continuing mission to provide you with exceptional heart care, our providers are all part of one team.  This team includes your primary Cardiologist (physician) and Advanced Practice Providers or APPs (Physician Assistants and Nurse Practitioners) who all work together to provide you with the care you need, when you need it.  Your next appointment:   4-6 week(s) Virtual [facility])   Provider:   Kardie Tobb, DO

## 2023-10-03 NOTE — Progress Notes (Signed)
 Cardiology Office Note:    Date:  10/03/2023   ID:  Steven Higgins, DOB 16-Jan-1970, MRN [id number]  PCP:  Steven Bascom RAMAN, NP  Cardiologist:  Steven Huntsman, DO  Electrophysiologist:  None   Referring MD: Steven Bascom RAMAN, NP    I am ok  History of Present Illness:    Steven Higgins is a 53 y.o. 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  The patient had follow up with our office and saw Dr. Wilbert Higgins and Steven Bring, PA.  His last visit was on May 19, 2018 at which time they restarted his carvedilol  3.125 mg twice daily his losartan could not be restarted due to increased creatinine and at that time he his physical exam did not suggest any acute exacerbation of heart failure.  This is my first visit with him.  He went to see his PCP today and given the EKG concern he was brought in on a DOD visit.  Past Medical History:  Diagnosis Date   CAD (coronary artery disease), native coronary artery 01/22/2017   coronary Ca score of 143 and mild nonobstructive plaque in the RCA that is tortuous with aneurysmal dilatation and minimal plaque in the LAD and LCx by coronary CTA 07/2016   Carotid artery stenosis 04/04/2016   1-39% bilateral by dopplers 03/2016   Diabetes mellitus (HCC)    Ectatic abdominal aorta    a. f/u due 2024.   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 EF 40-45% by echo 03/2016 presumed due to HTN - nuc with no ischemia, nonobstructive CAD by cor CT 07/2016.    Primary hyperaldosteronism    Renal artery stenosis 04/04/2016   By renal dopplers 03/2016 1-59% but not seen on CT abd 02/2017   Seizure disorder (HCC)    as sequela of small intracerebral bleed    History reviewed. No pertinent surgical history.  Current Medications: Current Meds  Medication Sig   atorvastatin  (LIPITOR) 20 MG tablet Take 1 tablet (20 mg total) by mouth every evening.   hydrALAZINE  (APRESOLINE ) 25 MG tablet Take 1.5 tablets (37.5 mg total) by mouth 3 (three) times daily.   levETIRAcetam  (KEPPRA  XR) 500 MG 24 hr tablet Take 2 tablets (1,000 mg total) by mouth at bedtime.   metFORMIN  (GLUCOPHAGE ) 500 MG tablet Take 1 tablet (500 mg total) by mouth 2 (two) times daily with a meal.     Allergies:   Bee pollen and Lisinopril    Social History   Socioeconomic History   Marital status: Single    Spouse name: Not on file   Number of children: Not on file   Years of education: Not on file   Highest education level: Not on file  Occupational History   Not on file  Tobacco Use   Smoking status: Every Day    Current packs/day: 0.25    Types: Cigarettes   Smokeless tobacco: Never   Tobacco comments:    07/30/16 smoke 3-4 cigarettes per day-not wearing patch  Vaping Use   Vaping status: Never Used  Substance and Sexual Activity   Alcohol use: No    Alcohol/week: 1.0 standard drink of alcohol    Types: 1 Cans of beer per week   Drug use: No   Sexual activity: Not Currently  Other Topics Concern   Not on file  Social History Narrative   Not on file   Social Drivers of Health   Financial Resource Strain: High Risk (02/13/2023)   Overall Financial Resource Strain (CARDIA)    Difficulty of Paying Living Expenses: Very hard  Food Insecurity: Food Insecurity Present (02/13/2023)   Hunger Vital Sign    Worried About Running Out of Food in the Last Year: Never true    Ran Out of Food in the Last Year: Sometimes true  Transportation Needs: Unmet Transportation Needs  (02/13/2023)   PRAPARE - Transportation    Lack of Transportation (Medical): No    Lack of Transportation (Non-Medical): Yes  Physical Activity: Unknown (02/13/2023)   Exercise Vital Sign    Days of Exercise per Week: Patient declined    Minutes of Exercise per Session: Not on file  Stress: Stress Concern Present (02/13/2023)   [employer] of Occupational Health - Occupational Stress Questionnaire    Feeling of Stress : To some extent  Social Connections: Unknown (02/13/2023)   Social Connection and Isolation Panel    Frequency of Communication with Friends and Family: More than three times a week    Frequency of Social Gatherings with Friends and Family: Once a week    Attends Religious Services: 1 to 4 times per year    Active Member of [employer] or Organizations: Not on file    Attends Banker Meetings: Not on file    Marital Status: Patient declined     Family History: The patient's family history includes Diabetes in his maternal grandmother and mother; Heart attack in his father; Heart disease in his father and mother; Hypertension in his father, maternal grandmother, mother, and paternal grandmother.  ROS:   Review of Systems  Constitution: Negative for decreased appetite, fever and weight gain.  HENT: Negative for congestion, ear discharge, hoarse voice and sore throat.   Eyes: Negative for discharge, redness, vision loss in right eye and visual halos.  Cardiovascular: Negative for chest pain, dyspnea on exertion, leg swelling, orthopnea and palpitations.  Respiratory: Negative for cough, hemoptysis, shortness of breath and snoring.   Endocrine: Negative for heat intolerance and polyphagia.  Hematologic/Lymphatic: Negative for bleeding problem. Does not bruise/bleed easily.  Skin: Negative for flushing, nail changes, rash and suspicious lesions.  Musculoskeletal: Negative for arthritis, joint pain, muscle cramps, myalgias, neck pain and stiffness.   Gastrointestinal: Negative for abdominal pain, bowel incontinence, diarrhea and excessive appetite.  Genitourinary: Negative for decreased libido, genital sores and incomplete emptying.  Neurological: Negative for brief paralysis, focal weakness, headaches and loss of balance.  Psychiatric/Behavioral: Negative for altered mental status, depression and suicidal ideas.  Allergic/Immunologic: Negative for HIV exposure and persistent infections.    EKGs/Labs/Other Studies Reviewed:    The following studies were reviewed today:   EKG:  The ekg ordered today demonstrates   Recent Labs: 10/29/2022: BUN 11; Creatinine, Ser 1.68; Hemoglobin 17.2; Platelets 239; Potassium 3.8; Sodium 137  Recent Lipid Panel    Component Value Date/Time   CHOL 133 02/14/2022 1210   TRIG 61 02/14/2022 1210   HDL 52 02/14/2022 1210   CHOLHDL 2.6 02/14/2022 1210   CHOLHDL 4.3 03/16/2016 0705  VLDL 16 03/16/2016 0705   LDLCALC 68 02/14/2022 1210    Physical Exam:    VS:  BP (!) 158/100   Pulse (!) 127   Ht 6' 6.5 (1.994 m)   Wt 224 lb (101.6 kg)   SpO2 99%   BMI 25.56 kg/m     Wt Readings from Last 3 Encounters:  10/03/23 224 lb (101.6 kg)  10/03/23 224 lb (101.6 kg)  08/06/23 225 lb 8 oz (102.3 kg)     GEN: Well nourished, well developed in no acute distress HEENT: Normal NECK: No JVD; No carotid bruits LYMPHATICS: No lymphadenopathy CARDIAC: S1S2 noted,RRR, no murmurs, rubs, gallops RESPIRATORY:  Clear to auscultation without rales, wheezing or rhonchi  ABDOMEN: Soft, non-tender, non-distended, +bowel sounds, no guarding. EXTREMITIES: No edema, No cyanosis, no clubbing MUSCULOSKELETAL:  No deformity  SKIN: Warm and dry NEUROLOGIC:  Alert and oriented x 3, non-focal PSYCHIATRIC:  Normal affect, good insight  ASSESSMENT:    1. 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 Coronary artery disease involving native coronary artery of  native heart without angina pectoris   3. Hypertensive heart and chronic kidney disease with heart failure and stage 1 through stage 4 chronic kidney disease, or chronic kidney disease (HCC)   4. Cardiomyopathy due to hypertension, without heart failure (HCC)   5. Nonischemic cardiomyopathy (HCC)    PLAN:    Hypertensive heart disease-he is hypertensive in the office today manual for me his blood pressure was 158/100 mmHg.  He is currently on carvedilol  6.25 mg twice daily I am going to increase this to 12.5 mg daily.  We will continue his current dose of hydralazine  which is 37.5 mg 3 times daily, he also is on Imdur  60 mg twice a day.  We will follow him closely for his blood pressure as well.  He will either see me in 4 to 6 weeks or one of our pharmacist partners.   He definitely needs a repeat echocardiogram in 2018 EF is 40 to 45%.  He repeat echocardiogram which we are going to order and hopefully get scheduled soon.  Clinically he does not appear to be volume overloaded this is good  Coronary artery disease-he does not complain of any anginal symptoms.  Will continue the patient on his aspirin  and atorvastatin .  Abnormal EKG-this is not new   CKD III -avoid nephrotoxins.  His most recent creatinine was 1.68  the patient is in agreement with the above plan. The patient  t left the office in stable condition.  The patient will follow up in   Medication Adjustments/Labs and Tests Ordered: Current medicines are reviewed at length with the patient today.  Concerns regarding medicines are outlined above.  No orders of the defined types were placed in this encounter.  No orders of the defined types were placed in this encounter.   Patient Instructions  Medication Instructions:  Your physician has recommended you make the following change in your medication:  INCREASE: Coreg  12.5 mg twice daily  *If you need a refill on your cardiac medications before your next appointment, please  call your pharmacy*   Testing/Procedures: Your physician has requested that you have an echocardiogram. Echocardiography is a painless test that uses sound waves to create images of your heart. It provides your doctor with information about the size and shape of your heart and how well your heart's chambers and valves are working. This procedure takes approximately one  hour. There are no restrictions for this procedure. Please do NOT wear cologne, perfume, aftershave, or lotions (deodorant is allowed). Please arrive 15 minutes prior to your appointment time.  Please note: We ask at that you not Higgins children with you during ultrasound (echo/ vascular) testing. Due to room size and safety concerns, children are not allowed in the ultrasound rooms during exams. Our front office staff cannot provide observation of children in our lobby area while testing is being conducted. An adult accompanying a patient to their appointment will only be allowed in the ultrasound room at the discretion of the ultrasound technician under special circumstances. We apologize for any inconvenience.   Follow-Up: At [facility], you and your health needs are our priority.  As part of our continuing mission to provide you with exceptional heart care, our providers are all part of one team.  This team includes your primary Cardiologist (physician) and Advanced Practice Providers or APPs (Physician Assistants and Nurse Practitioners) who all work together to provide you with the care you need, when you need it.  Your next appointment:   4-6 week(s) Virtual [facility])   Provider:   Ashtyn Freilich, DO          Adopting a Healthy Lifestyle.  Know what a healthy weight is for you (roughly BMI <25) and aim to maintain this   Aim for 7+ servings of fruits and vegetables daily   65-80+ fluid ounces of water or unsweet tea for healthy kidneys   Limit to max 1 drink of alcohol per day; avoid smoking/tobacco    Limit animal fats in diet for cholesterol and heart health - choose grass fed whenever available   Avoid highly processed foods, and foods high in saturated/trans fats   Aim for low stress - take time to unwind and care for your mental health   Aim for 150 min of moderate intensity exercise weekly for heart health, and weights twice weekly for bone health   Aim for 7-9 hours of sleep daily   When it comes to diets, agreement about the perfect plan isnt easy to find, even among the experts. Experts at the [facility] [employer] developed an idea known as the Healthy Eating Plate. Just imagine a plate divided into logical, healthy portions.   The emphasis is on diet quality:   Load up on vegetables and fruits - one-half of your plate: Aim for color and variety, and remember that potatoes dont count.   Go for whole grains - one-quarter of your plate: Whole wheat, barley, wheat berries, quinoa, oats, brown rice, and foods made with them. If you want pasta, go with whole wheat pasta.   Protein power - one-quarter of your plate: Fish, chicken, beans, and nuts are all healthy, versatile protein sources. Limit red meat.   The diet, however, does go beyond the plate, offering a few other suggestions.   Use healthy plant oils, such as olive, canola, soy, corn, sunflower and peanut. Check the labels, and avoid partially hydrogenated oil, which have unhealthy trans fats.   If youre thirsty, drink water. Coffee and tea are good in moderation, but skip sugary drinks and limit milk and dairy products to one or two daily servings.   The type of carbohydrate in the diet is more important than the amount. Some sources of carbohydrates, such as vegetables, fruits, whole grains, and beans-are healthier than others.   Finally, stay active  Signed, Sabastian Raimondi, DO  10/03/2023 3:47 PM  [facility]

## 2023-10-03 NOTE — Patient Instructions (Signed)
 [facility] (accepts Medicaid, Medicare, [employer], and/or Self-Pay) [facility]  [address], [address],  [place], [place] [address] [phone]  [facility]  [address] [place], [place] [address] [phone]  [facility]  [facility] [place] [address] [place], [place] [address] *Located next to [facility]  [phone]  [facility]  [address] [place], [place] [address]  *Located next to [employer] [phone]  [facility]  [address] [place], [place] [address] [phone]  [facility]  [address] [place], [place] [address]  [phone] *Located inside [facility]  [address] [place], [place] [address] [phone]  [facility] [address] [place], [place] [address] [phone] [address] [place], [place] [address] [phone]  [facility] [address] [place], [place] [address] [phone]  [facility]  [address] [place], [place] [address] [phone]  [facility]  [address] [place], [place] [address] [phone]

## 2023-10-03 NOTE — Progress Notes (Signed)
Echo order placed per Dr. Mallory Shirk request.  ?

## 2023-10-04 ENCOUNTER — Ambulatory Visit: Payer: Self-pay | Admitting: Nurse Practitioner

## 2023-10-04 ENCOUNTER — Other Ambulatory Visit (HOSPITAL_COMMUNITY): Payer: MEDICAID

## 2023-10-04 ENCOUNTER — Telehealth: Payer: Self-pay | Admitting: *Deleted

## 2023-10-04 LAB — COMPREHENSIVE METABOLIC PANEL WITH GFR
ALT: 16 IU/L (ref 0–44)
AST: 16 IU/L (ref 0–40)
Albumin: 4.5 g/dL (ref 3.8–4.9)
Alkaline Phosphatase: 70 IU/L (ref 47–123)
BUN/Creatinine Ratio: 10 (ref 9–20)
BUN: 15 mg/dL (ref 6–24)
Bilirubin Total: 0.3 mg/dL (ref 0.0–1.2)
CO2: 22 mmol/L (ref 20–29)
Calcium: 10 mg/dL (ref 8.7–10.2)
Chloride: 100 mmol/L (ref 96–106)
Creatinine, Ser: 1.52 mg/dL — ABNORMAL HIGH (ref 0.76–1.27)
Globulin, Total: 2.9 g/dL (ref 1.5–4.5)
Glucose: 100 mg/dL — ABNORMAL HIGH (ref 70–99)
Potassium: 4.1 mmol/L (ref 3.5–5.2)
Sodium: 136 mmol/L (ref 134–144)
Total Protein: 7.4 g/dL (ref 6.0–8.5)
eGFR: 55 mL/min/1.73 — ABNORMAL LOW (ref >59)

## 2023-10-04 LAB — CBC
Hematocrit: 49.6 % (ref 37.5–51.0)
Hemoglobin: 16.3 g/dL (ref 13.0–17.7)
MCH: 27.1 pg (ref 26.6–33.0)
MCHC: 32.9 g/dL (ref 31.5–35.7)
MCV: 82 fL (ref 79–97)
Platelets: 280 x10E3/uL (ref 150–450)
RBC: 6.02 x10E6/uL — ABNORMAL HIGH (ref 4.14–5.80)
RDW: 13 % (ref 11.6–15.4)
WBC: 7.5 x10E3/uL (ref 3.4–10.8)

## 2023-10-04 LAB — LIPID PANEL
Chol/HDL Ratio: 5 ratio (ref 0.0–5.0)
Cholesterol, Total: 258 mg/dL — ABNORMAL HIGH (ref 100–199)
HDL: 52 mg/dL (ref >39)
LDL Chol Calc (NIH): 157 mg/dL — ABNORMAL HIGH (ref 0–99)
Triglycerides: 267 mg/dL — ABNORMAL HIGH (ref 0–149)
VLDL Cholesterol Cal: 49 mg/dL — ABNORMAL HIGH (ref 5–40)

## 2023-10-04 NOTE — Progress Notes (Signed)
 Complex Care Management Note Care Guide Note  10/04/2023 Name: Steven Higgins MRN: [id number] DOB: 10/05/1970   Complex Care Management Outreach Attempts: An unsuccessful telephone outreach was attempted today to offer the patient information about available complex care management services.  Follow Up Plan:  Additional outreach attempts will be made to offer the patient complex care management information and services.   Encounter Outcome:  No Answer  Thedford Franks, CMA [place]  [facility], [facility] Guide Direct Dial: [phone]  Fax: [phone] Website: [email or web address]

## 2023-10-04 NOTE — Progress Notes (Signed)
 Complex Care Management Note  Care Guide Note 10/04/2023 Name: Steven Higgins MRN: [id number] DOB: [birth year]  Steven Higgins is a 53 y.o. year old male who sees [name], NP for primary care. I reached out to Rawlin Fairbairn by phone today to offer complex care management services.  Steven Higgins was given information about Complex Care Management services today including:   The Complex Care Management services include support from the care team which includes your Nurse Care Manager, Clinical Social Worker, or Pharmacist.  The Complex Care Management team is here to help remove barriers to the health concerns and goals most important to you. Complex Care Management services are voluntary, and the patient may decline or stop services at any time by request to their care team member.   Complex Care Management Consent Status: Patient agreed to services and verbal consent obtained.   Follow up plan:  Telephone appointment with complex care management team member scheduled for:  10/11/2023 10/17/2023 11/19/2023  Encounter Outcome:  Patient Scheduled  Thedford Franks, CMA [place]  [facility], [facility] Guide Direct Dial: [phone]  Fax: [phone] Website: [email or web address]

## 2023-10-11 ENCOUNTER — Other Ambulatory Visit: Payer: MEDICAID

## 2023-10-11 ENCOUNTER — Other Ambulatory Visit: Payer: Self-pay

## 2023-10-11 NOTE — Patient Instructions (Signed)
 Jakevion Ketter - I am sorry I was unable to reach you today for our scheduled appointment. I work with [name], NP and am calling to support your healthcare needs. Please contact me at [phone] at your earliest convenience. I look forward to speaking with you soon.   Thank you,   Laymon Doll, BSW [place]/VBCI - [facility] Social Worker [phone]

## 2023-10-17 ENCOUNTER — Telehealth: Payer: MEDICAID | Admitting: *Deleted

## 2023-10-23 ENCOUNTER — Other Ambulatory Visit: Payer: Self-pay

## 2023-10-24 ENCOUNTER — Other Ambulatory Visit: Payer: Self-pay

## 2023-10-25 ENCOUNTER — Other Ambulatory Visit: Payer: Self-pay

## 2023-10-28 ENCOUNTER — Other Ambulatory Visit: Payer: Self-pay

## 2023-10-28 NOTE — Patient Outreach (Signed)
 Patient returned BSW VM from earlier outreach attempt. BSW informed pt he RS him for 11/06/2023 at 2:45pm. Pt confirmed that date and time works for him and confirmed he would be available to speak with BSW.

## 2023-10-28 NOTE — Patient Instructions (Signed)
 Eoghan Bradfield - I am sorry I was unable to reach you today for our scheduled appointment. I work with [name], NP and am calling to support your healthcare needs. Please contact me at [phone] at your earliest convenience. I look forward to speaking with you soon.   Thank you,   Laymon Doll, BSW [place]/VBCI - [facility] Social Worker [phone]

## 2023-11-05 ENCOUNTER — Encounter: Payer: Self-pay | Admitting: Family Medicine

## 2023-11-05 ENCOUNTER — Ambulatory Visit: Payer: Self-pay | Admitting: Family Medicine

## 2023-11-05 LAB — GLUCOSE, POCT (MANUAL RESULT ENTRY)
POC Glucose: 139 mg/dL — AB (ref 70–99)
POC Glucose: 139 mg/dL — AB (ref 70–99)

## 2023-11-06 ENCOUNTER — Other Ambulatory Visit: Payer: MEDICAID

## 2023-11-06 NOTE — Patient Instructions (Signed)
 Steven Higgins - I have attempted to call you three times but have been unsuccessful in reaching you. I work with [name], NP and am calling to support your healthcare needs. If I can be of assistance to you, please contact me at [phone].     Thank you,   Laymon Doll, BSW /VBCI - [facility] Social Worker [phone]

## 2023-11-07 ENCOUNTER — Telehealth: Payer: Self-pay

## 2023-11-07 NOTE — Progress Notes (Signed)
 Complex Care Management Care Guide Note  11/07/2023 Name: Eduard Alcantar MRN: [id number] DOB: 08/17/1970  Steven Higgins is a 53 y.o. year old male who is a primary care patient of [name], NP and is actively engaged with the care management team. I reached out to Reymond Tanori by phone today to assist with re-scheduling  with the BSW.  Follow up plan: Telephone appointment with complex care management team member scheduled for:  11/12/205 @ 3 PM  .BR

## 2023-11-12 ENCOUNTER — Ambulatory Visit: Payer: Self-pay | Admitting: Family Medicine

## 2023-11-12 ENCOUNTER — Encounter: Payer: Self-pay | Admitting: *Deleted

## 2023-11-12 LAB — GLUCOSE, POCT (MANUAL RESULT ENTRY): POC Glucose: 146 mg/dL — AB (ref 70–99)

## 2023-11-12 NOTE — Progress Notes (Signed)
 Nursing Intake Note - Returning Patient  [facility]  Chief Complaint: Blood glucose check  Living Situation:Living In [employer] Status:     Patient Status:  [x]  Returning patient to [employer]  Confirmed demographics and emergency contact info  HIPAA and privacy policies previously reviewed  Consent for digital charting: [x]  Previously Signed []  Signed Today []  Not Signed  Additional Notes:  Vital signs taken and entered in flowsheet  Interpreter services: []  Needed [x]  Not Needed  Brief SDOH update completed  Referral to provider: []  Needed [x]  Not Needed  RN Interventions Provided Today:  [x]  Health education (e.g., chronic disease, hygiene, nutrition)   [x]  Other: CGB, reviewed CBG trends and A1C

## 2023-11-13 ENCOUNTER — Other Ambulatory Visit: Payer: MEDICAID

## 2023-11-13 ENCOUNTER — Encounter: Payer: Self-pay | Admitting: *Deleted

## 2023-11-13 NOTE — Patient Outreach (Signed)
 BSW spoke with Tania Peloski at [facility] [phone]) and confirmed patient is receiving services/support from them. Per Tania, pt is on a waitlist for rapid-rehousing program and also receives basic support with food, clothing, transportation.

## 2023-11-13 NOTE — Patient Outreach (Signed)
 Referral received from Nichols, Tonya S, NP for assistance with care management needs. This patient is enrolled in [employer] and has associated care management benefits. I reached out to the health plan today to refer the patient to the assigned health plan care management team member.   Interventions:  Dayshawn Whittier was advised to anticipate contact from the health plan for follow up about care management services and resources. Patient is homeless and working with partners ending homelessness to get into housing. Patient visited [facility] today to begin process to apply for SSI/SSDI.

## 2023-11-13 NOTE — Patient Instructions (Signed)
 Visit Information  Thank you for taking time to visit with me today.   Tailored Plan Medicaid On July 02, 2022 some people on [place] Medicaid will move to a new kind of Medicaid health plan called a Tailored Plan. Tailored Plans cover your doctor visits, prescription drugs, and health care services.    If your [place] Medicaid will move to a Tailored Plan, you should have gotten a letter and welcome packet. If you're not sure, call your [place] Medicaid Enrollment Broker at [phone] and ask.  Check out these free materials, in Spanish and English, to learn more about your Tailored Plan: [email or web address].Gov/Tailored-Plans/Toolkit  Tailored Care Management Services  TCM services are available to you now. If you are a Tailored Plan member or will be and want information about Tailored Care Management Services including rides to appointments and community and home services, call the Care Management provider for your county of residence:    [facility] [place], [place])  Member Services: [phone] Behavioral Health Crisis Line: [phone], [place], [place], [place], [place])  Member Services: [phone] Behavioral Health Crisis Line: [phone]     Please call the Suicide and Crisis Lifeline: 988 go to [facility] [address] [place] [phone]) call 911 if you are experiencing a Mental Health or Behavioral Health Crisis or need someone to talk to.  Care plan and visit instructions communicated with the patient verbally today. Patient agrees to receive a copy in MyChart. Active MyChart status and patient understanding of how to access instructions and care plan via MyChart confirmed with patient.     Laymon Doll, BSW [place]/VBCI - [employer] Social Worker [phone]

## 2023-11-15 ENCOUNTER — Ambulatory Visit: Payer: MEDICAID | Attending: Cardiology | Admitting: Cardiology

## 2023-11-15 ENCOUNTER — Other Ambulatory Visit (HOSPITAL_COMMUNITY): Payer: Self-pay

## 2023-11-15 ENCOUNTER — Encounter: Payer: Self-pay | Admitting: Cardiology

## 2023-11-15 VITALS — BP 180/90 | HR 84 | Ht 78.0 in | Wt 222.8 lb

## 2023-11-15 DIAGNOSIS — I2510 Atherosclerotic heart disease of native coronary artery without angina pectoris: Secondary | ICD-10-CM | POA: Insufficient documentation

## 2023-11-15 DIAGNOSIS — I5020 Unspecified systolic (congestive) heart failure: Secondary | ICD-10-CM | POA: Insufficient documentation

## 2023-11-15 DIAGNOSIS — N1831 Chronic kidney disease, stage 3a: Secondary | ICD-10-CM | POA: Diagnosis present

## 2023-11-15 DIAGNOSIS — I130 Hypertensive heart and chronic kidney disease with heart failure and stage 1 through stage 4 chronic kidney disease, or unspecified chronic kidney disease: Secondary | ICD-10-CM | POA: Diagnosis present

## 2023-11-15 MED ORDER — CARVEDILOL 25 MG PO TABS
25.0000 mg | ORAL_TABLET | Freq: Two times a day (BID) | ORAL | 3 refills | Status: AC
  Filled 2023-11-15: qty 180, 90d supply, fill #0

## 2023-11-15 MED ORDER — VALSARTAN 80 MG PO TABS
80.0000 mg | ORAL_TABLET | Freq: Every day | ORAL | 3 refills | Status: DC
  Filled 2023-11-15: qty 90, 90d supply, fill #0

## 2023-11-15 NOTE — Patient Instructions (Addendum)
 Medication Instructions:  Your physician has recommended you make the following change in your medication:  STOP: Hydralazine   START: Valsartan  80 mg once daily INCREASE: Coreg  25 mg twice daily  *If you need a refill on your cardiac medications before your next appointment, please call your pharmacy*  Testing/Procedures: Please schedule echo.   Follow-Up: At [facility], you and your health needs are our priority.  As part of our continuing mission to provide you with exceptional heart care, our providers are all part of one team.  This team includes your primary Cardiologist (physician) and Advanced Practice Providers or APPs (Physician Assistants and Nurse Practitioners) who all work together to provide you with the care you need, when you need it.  Your next appointment:   4-6 week(s) doublebook   Provider:   Kardie Tobb, DO    Other Instructions:

## 2023-11-19 ENCOUNTER — Other Ambulatory Visit: Payer: MEDICAID

## 2023-11-19 ENCOUNTER — Other Ambulatory Visit: Payer: Self-pay

## 2023-11-19 ENCOUNTER — Other Ambulatory Visit: Payer: Self-pay | Admitting: Adult Health

## 2023-11-19 DIAGNOSIS — E785 Hyperlipidemia, unspecified: Secondary | ICD-10-CM

## 2023-11-19 DIAGNOSIS — I10 Essential (primary) hypertension: Secondary | ICD-10-CM

## 2023-11-19 MED ORDER — ISOSORBIDE MONONITRATE ER 60 MG PO TB24
60.0000 mg | ORAL_TABLET | Freq: Every day | ORAL | 3 refills | Status: AC
  Filled 2023-11-19: qty 90, 90d supply, fill #0

## 2023-11-19 MED ORDER — ATORVASTATIN CALCIUM 40 MG PO TABS
40.0000 mg | ORAL_TABLET | Freq: Every evening | ORAL | 3 refills | Status: AC
  Filled 2023-11-19: qty 30, 30d supply, fill #0
  Filled 2023-12-16: qty 30, 30d supply, fill #1
  Filled 2024-01-14: qty 30, 30d supply, fill #2

## 2023-11-19 NOTE — Patient Instructions (Signed)
 Visit Information  Thank you for taking time to visit with me today.   Tailored Plan Medicaid On July 02, 2022 some people on [place] Medicaid will move to a new kind of Medicaid health plan called a Tailored Plan. Tailored Plans cover your doctor visits, prescription drugs, and health care services.    If your [place] Medicaid will move to a Tailored Plan, you should have gotten a letter and welcome packet. If you're not sure, call your [place] Medicaid Enrollment Broker at [phone] and ask.  Check out these free materials, in Spanish and English, to learn more about your Tailored Plan: [email or web address].Gov/Tailored-Plans/Toolkit  Tailored Care Management Services  TCM services are available to you now. If you are a Tailored Plan member or will be and want information about Tailored Care Management Services including rides to appointments and community and home services, call the Care Management provider for your county of residence:    [facility] [place], [place])  Member Services: [phone] Behavioral Health Crisis Line: [phone], [place], [place], [place], [place])  Member Services: [phone] Behavioral Health Crisis Line: [phone]     Please call the Suicide and Crisis Lifeline: 988 go to [facility] [address] [place] [phone]) call 911 if you are experiencing a Mental Health or Behavioral Health Crisis or need someone to talk to.  Care plan and visit instructions communicated with the patient verbally today. Patient agrees to receive a copy in MyChart. Active MyChart status and patient understanding of how to access instructions and care plan via MyChart confirmed with patient.     Laymon Doll, BSW [place]/VBCI - [employer] Social Worker [phone]

## 2023-11-19 NOTE — Patient Outreach (Signed)
 Referral received from Nichols, Tonya S, NP for assistance with care management needs. This patient is enrolled in (name of health plan) and has associated care management benefits. I reached out to the health plan today to refer the patient to the assigned health plan care management team member.   Interventions:  Steven Higgins is currently working with different individuals who are trying to assist with housing interventions and other SDOH needs.  His current care manager is Jon Smock with [employer] [phone]. Steven Higgins is in communication with his care manger and is also working with [employer] [phone] and Tania Peloski [phone]. Jon reports [facility] is helping pt seek housing through rapid-rehousing. Jon also reports she recently received approval for pt to receive additional services individual transition support services.

## 2023-11-19 NOTE — Progress Notes (Signed)
 11/19/2023 Name: Steven Higgins MRN: [id number] DOB: [birth year]  Chief Complaint  Patient presents with   Congestive Heart Failure   Diabetes   Hyperlipidemia    Steven Higgins is a 53 y.o. year old male who presented for a telephone visit.   They were referred to the pharmacist by their PCP for assistance in managing hypertension and hyperlipidemia/cardiovascular risk reduction. 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   Subjective: Patient was last seen by PCP, Bascom Borer, NP, on 10/03/23. At last visit, patient described recent dizzy spells, racing HR, and elevated BP.  HR was 127 bpm. He was seen by Dr. Sheena that same day. He was instructed to increase carvedilol  to 12.5 mg BID. At f/u appt with Dr. Sheena on 11/15/23, BP was 180/90 mmHg. He was instructed to stop hydralazine , start valsartan  80 mg daily, and increase carvedilol  to 25 mg BID.  Today, patient reports doing ok. Confirms he made the changes instructed by Dr. Sheena. He does endorse some difficulty affording his medications. Would prefer to get 90ds. Needs refills of isosorbide  and Keppra . He does have a home BP monitor.  Care Team: Primary Care Provider: Borer Bascom RAMAN, NP ; Next Scheduled Visit: 01/03/24 Cardiologist: Kardie Tobb, DO; Next Scheduled Visit: 12/17/23  Medication Access/Adherence  Current Pharmacy:  [facility] [address], [address] [place] [address] Phone: [phone] Fax: [phone]   Patient reports affordability concerns with their medications: Yes  - counseled on ability to use charge account at [facility] with [facility] insurance Patient reports access/transportation concerns to their pharmacy: No  Patient reports adherence concerns with their medications:  Yes  - hx of noncompliance with  medications   Diabetes:  Current medications: metformin  XR 500 mg BID   Hyperlipidemia/ASCVD Risk Reduction  Current lipid lowering medications: atorvastatin  20 mg daily  Antiplatelet regimen: none  ASCVD History: Coronary CTA in July 2018 - CAC of 143 (98th percentile), mild non-obstructive plaque in RCA, LAD, LCX Family History: Heart attack in his father; Heart disease in his father and mother  Risk Factors:   Heart Failure (EF 40-45% in 2018):  Current medications:  ACEi/ARB/ARNI: valsartan  80 mg daily (Entresto discontinued in 2022 due to AKI) SGLT2i: none Beta blocker: carvedilol  25 mg BID Mineralocorticoid Receptor Antagonist: none - spironolactone  discontinued in 2022 due to AKI Diuretic regimen: none  Current home blood pressure readings: 161/90 mmHg, SBP mostly 150-160s since starting valsartan   Patient denies volume overload signs or symptoms including shortness of breath, lower extremity edema, increased use of pillows at night  Objective:  BP Readings from Last 3 Encounters:  11/15/23 (!) 180/90  11/05/23 (!) 160/90  10/03/23 (!) 158/100    Lab Results  Component Value Date   HGBA1C 6.3 (A) 02/14/2023   HGBA1C 6.2 (A) 08/15/2022   HGBA1C 6.0 (A) 05/03/2022       Latest Ref Rng & Units 10/03/2023    2:05 PM 10/29/2022    1:20 PM 08/15/2022   12:18 PM  BMP  Glucose 70 - 99 mg/dL 899  832  73   BUN 6 - 24 mg/dL 15  11  11    Creatinine 0.76 - 1.27 mg/dL 8.47  8.31  8.69   BUN/Creat Ratio 9 - 20 10   8    Sodium 134 - 144 mmol/L 136  137  137   Potassium 3.5 - 5.2 mmol/L 4.1  3.8  4.2   Chloride 96 - 106 mmol/L 100  96  101   CO2 20 - 29 mmol/L 22  13  23    Calcium  8.7 - 10.2 mg/dL 89.9  9.6  9.9     Lab Results  Component Value Date   CHOL 258 (H) 10/03/2023   HDL 52 10/03/2023   LDLCALC 157 (H) 10/03/2023   TRIG 267 (H) 10/03/2023   CHOLHDL 5.0 10/03/2023    Medications Reviewed Today     Reviewed by Brinda Lorain SQUIBB, RPH (Pharmacist) on  11/19/23 at 1423  Med List Status: <None>   Medication Order Taking? Sig Documenting Provider Last Dose Status Informant  atorvastatin  (LIPITOR) 40 MG tablet [id number]  Take 1 tablet (40 mg total) by mouth every evening. [name], NP  Active   carvedilol  (COREG ) 25 MG tablet [id number] Yes Take 1 tablet (25 mg total) by mouth 2 (two) times daily. Tobb, Kardie, DO  Active   isosorbide  mononitrate (IMDUR ) 60 MG 24 hr tablet [id number]  Take 1 tablet (60 mg total) by mouth daily. [name], NP  Active   levETIRAcetam  (KEPPRA  XR) 500 MG 24 hr tablet [id number] Yes Take 2 tablets (1,000 mg total) by mouth at bedtime. Whitfield Raisin, NP  Active   metFORMIN  (GLUCOPHAGE ) 500 MG tablet [id number] Yes Take 1 tablet (500 mg total) by mouth 2 (two) times daily with a meal. [name], NP  Active   valsartan  (DIOVAN ) 80 MG tablet [id number] Yes Take 1 tablet (80 mg total) by mouth daily. Tobb, Kardie, DO  Active   Med List Note Lucious Julian, CPhT 11/20/20 1142): SABRA              Assessment/Plan:   Diabetes: - Currently controlled; goal A1c <7%.  - Reviewed long term cardiovascular and renal outcomes of uncontrolled blood sugar. - Recommend to continue metformin  500 mg BID.   Hyperlipidemia/ASCVD Risk Reduction: - Currently uncontrolled with most recent LDL-C of 157 mg/dL above goal < 70 mg/dL given nonadherence to statin. Patient is a good candidate for high intensity statin with multiple ASCVD risk factors. He is agreeable to increasing today.  - Reviewed long term complications of uncontrolled cholesterol - Reviewed lifestyle recommendations to lower LDL-C including regular physical activity, 5-10% weight loss, eating a diet low in saturated fat, and increasing intake of fiber to at least 25 g per day. - Recommend to increase atorvastatin  to 40 mg daily    HFrEF and HTN: - Currently appropriately managed by cardiology. Patient has a diagnosis of hyperaldosteronism, but appears  spironolactone  was discontinued in 2022 after he was hospitalized with an AKI in the setting of dehydration. May be appropriate to retrial low dose MRA in the future if BP continues to be uncontrolled, but will defer to cardiology.  - Reviewed appropriate blood pressure monitoring technique and reviewed goal blood pressure - Reviewed dietary modifications including limiting salt intake - Recommend to continue carvedilol  25 mg BID, valsartan  80 mg daily, isosorbide  mononitrate 60 mg daily  - Patient has follow-up scheduled with cardiology, at which time he will be due for a BMP after starting valsartan   Written patient instructions provided. Patient verbalized understanding of treatment plan. Assisted in requesting refill of Keppra , and asked pharmacy to fill isosorbide  and higher dose of atorvastatin  today.   Follow Up Plan:  Pharmacist telephone 01/01/24 PCP clinic visit in 01/03/24   Lorain Brinda, PharmD  [facility] [phone]

## 2023-11-20 NOTE — Progress Notes (Signed)
 Cardiology Office Note:    Date:  11/20/2023   ID:  Steven Higgins, DOB March 18, 1970, MRN [id number]  PCP:  [name], NP  Cardiologist:  Dub Huntsman, DO  Electrophysiologist:  None   Referring MD: [name], NP    I am ok  History of Present Illness:    Steven Higgins is a 53 y.o. 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  During my first visit with him - he was hypertensive. I adjusted his medications and ordered an echo. He reports that he had been taking his medications. But his echo was not schedule due to insurance issues.   Past Medical History:  Diagnosis Date   CAD (coronary artery disease), native coronary artery 01/22/2017   coronary Ca score of 143 and mild nonobstructive plaque in the RCA that is tortuous with aneurysmal dilatation and minimal plaque in the LAD and LCx by coronary CTA 07/2016   Carotid artery stenosis 04/04/2016   1-39% bilateral by dopplers 03/2016   Diabetes mellitus (HCC)    Ectatic abdominal aorta    a. f/u due 2024.   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 EF 40-45% by echo 03/2016 presumed due to HTN - nuc with no ischemia, nonobstructive CAD by cor CT 07/2016.   Primary hyperaldosteronism    Renal artery stenosis 04/04/2016   By renal dopplers 03/2016 1-59% but not seen on CT abd 02/2017   Seizure disorder (HCC)    as sequela of small intracerebral bleed    No past surgical history on file.  Current  Medications: Current Meds  Medication Sig   carvedilol  (COREG ) 25 MG tablet Take 1 tablet (25 mg total) by mouth 2 (two) times daily.   levETIRAcetam  (KEPPRA  XR) 500 MG 24 hr tablet Take 2 tablets (1,000 mg total) by mouth at bedtime.   metFORMIN  (GLUCOPHAGE ) 500 MG tablet Take 1 tablet (500 mg total) by mouth 2 (two) times daily with a meal.   valsartan  (DIOVAN ) 80 MG tablet Take 1 tablet (80 mg total) by mouth daily.   [DISCONTINUED] atorvastatin  (LIPITOR) 20 MG tablet Take 1 tablet (20 mg total) by mouth every evening.   [DISCONTINUED] carvedilol  (COREG ) 12.5 MG tablet Take 1 tablet (12.5 mg total) by mouth 2 (two) times daily.   [DISCONTINUED] hydrALAZINE  (APRESOLINE ) 25 MG tablet Take 1.5 tablets (37.5 mg total) by mouth 3 (three) times daily.   [DISCONTINUED] isosorbide  mononitrate (IMDUR ) 60 MG 24 hr tablet Take 1 tablet (60 mg total) by mouth daily.     Allergies:   Bee pollen and Lisinopril    Social History   Socioeconomic History   Marital status: Single    Spouse name: Not on file   Number of children: Not on file   Years of education: Not on file   Highest education level: Not on file  Occupational History   Not on file  Tobacco Use   Smoking status: Every Day    Current packs/day: 0.25  Types: Cigarettes   Smokeless tobacco: Never   Tobacco comments:    07/30/16 smoke 3-4 cigarettes per day-not wearing patch  Vaping Use   Vaping status: Never Used  Substance and Sexual Activity   Alcohol use: No    Alcohol/week: 1.0 standard drink of alcohol    Types: 1 Cans of beer per week   Drug use: No   Sexual activity: Not Currently  Other Topics Concern   Not on file  Social History Narrative   Not on file   Social Drivers of Health   Financial Resource Strain: High Risk (02/13/2023)   Overall Financial Resource Strain (CARDIA)    Difficulty of Paying Living Expenses: Very hard  Food Insecurity: Food Insecurity Present (11/13/2023)   Hunger Vital Sign    Worried  About Running Out of Food in the Last Year: Sometimes true    Ran Out of Food in the Last Year: Sometimes true  Transportation Needs: Unmet Transportation Needs (11/13/2023)   PRAPARE - Administrator, Civil Service (Medical): Yes    Lack of Transportation (Non-Medical): Yes  Physical Activity: Unknown (02/13/2023)   Exercise Vital Sign    Days of Exercise per Week: Patient declined    Minutes of Exercise per Session: Not on file  Stress: Stress Concern Present (02/13/2023)   [employer] of Occupational Health - Occupational Stress Questionnaire    Feeling of Stress : To some extent  Social Connections: Unknown (02/13/2023)   Social Connection and Isolation Panel    Frequency of Communication with Friends and Family: More than three times a week    Frequency of Social Gatherings with Friends and Family: Once a week    Attends Religious Services: 1 to 4 times per year    Active Member of [employer] or Organizations: Not on file    Attends Banker Meetings: Not on file    Marital Status: Patient declined     Family History: The patient's family history includes Diabetes in his maternal grandmother and mother; Heart attack in his father; Heart disease in his father and mother; Hypertension in his father, maternal grandmother, mother, and paternal grandmother.  ROS:   Review of Systems  Constitution: Negative for decreased appetite, fever and weight gain.  HENT: Negative for congestion, ear discharge, hoarse voice and sore throat.   Eyes: Negative for discharge, redness, vision loss in right eye and visual halos.  Cardiovascular: Negative for chest pain, dyspnea on exertion, leg swelling, orthopnea and palpitations.  Respiratory: Negative for cough, hemoptysis, shortness of breath and snoring.   Endocrine: Negative for heat intolerance and polyphagia.  Hematologic/Lymphatic: Negative for bleeding problem. Does not bruise/bleed easily.  Skin: Negative for flushing,  nail changes, rash and suspicious lesions.  Musculoskeletal: Negative for arthritis, joint pain, muscle cramps, myalgias, neck pain and stiffness.  Gastrointestinal: Negative for abdominal pain, bowel incontinence, diarrhea and excessive appetite.  Genitourinary: Negative for decreased libido, genital sores and incomplete emptying.  Neurological: Negative for brief paralysis, focal weakness, headaches and loss of balance.  Psychiatric/Behavioral: Negative for altered mental status, depression and suicidal ideas.  Allergic/Immunologic: Negative for HIV exposure and persistent infections.    EKGs/Labs/Other Studies Reviewed:    The following studies were reviewed today:   EKG:  None today   Recent Labs: 10/03/2023: ALT 16; BUN 15; Creatinine, Ser 1.52; Hemoglobin 16.3; Platelets 280; Potassium 4.1; Sodium 136  Recent Lipid Panel    Component Value Date/Time   CHOL 258 (H) 10/03/2023 1405  TRIG 267 (H) 10/03/2023 1405   HDL 52 10/03/2023 1405   CHOLHDL 5.0 10/03/2023 1405   CHOLHDL 4.3 03/16/2016 0705   VLDL 16 03/16/2016 0705   LDLCALC 157 (H) 10/03/2023 1405    Physical Exam:    VS:  BP (!) 180/90   Pulse 84   Ht 6' 6 (1.981 m)   Wt 222 lb 12.8 oz (101.1 kg)   SpO2 94%   BMI 25.75 kg/m     Wt Readings from Last 3 Encounters:  11/15/23 222 lb 12.8 oz (101.1 kg)  11/05/23 227 lb 8 oz (103.2 kg)  10/03/23 224 lb (101.6 kg)     GEN: Well nourished, well developed in no acute distress HEENT: Normal NECK: No JVD; No carotid bruits LYMPHATICS: No lymphadenopathy CARDIAC: S1S2 noted,RRR, no murmurs, rubs, gallops RESPIRATORY:  Clear to auscultation without rales, wheezing or rhonchi  ABDOMEN: Soft, non-tender, non-distended, +bowel sounds, no guarding. EXTREMITIES: No edema, No cyanosis, no clubbing MUSCULOSKELETAL:  No deformity  SKIN: Warm and dry NEUROLOGIC:  Alert and oriented x 3, non-focal PSYCHIATRIC:  Normal affect, good insight  ASSESSMENT:    1.  Hypertensive heart and chronic kidney disease with heart failure and stage 1 through stage 4 chronic kidney disease, or chronic kidney disease (HCC)   2. HFrEF (heart failure with reduced ejection fraction) (HCC)   3. Coronary artery disease involving native coronary artery of native heart without angina pectoris   4. Stage 3a chronic kidney disease (HCC)     PLAN:    Hypertensive heart disease-he is hypertensive in the office today. Increase the coreg  to 25 mg twice a day, will add valsartan  80 mg daily.  He definitely still need a repeat echocardiogram in 2018 EF is 40 to 45%. Unfortunately there has been a problem with the insurance. I will reorder.  Clinically he does not appear to be volume overloaded this is good  Coronary artery disease-he does not complain of any anginal symptoms.  Will continue the patient on his aspirin  and atorvastatin .  CKD III -avoid nephrotoxins.  His most recent creatinine was 1.68  The patient is in agreement with the above plan. The patient left the office in stable condition.  The patient will follow up in   Medication Adjustments/Labs and Tests Ordered: Current medicines are reviewed at length with the patient today.  Concerns regarding medicines are outlined above.  No orders of the defined types were placed in this encounter.  Meds ordered this encounter  Medications   valsartan  (DIOVAN ) 80 MG tablet    Sig: Take 1 tablet (80 mg total) by mouth daily.    Dispense:  90 tablet    Refill:  3   carvedilol  (COREG ) 25 MG tablet    Sig: Take 1 tablet (25 mg total) by mouth 2 (two) times daily.    Dispense:  180 tablet    Refill:  3    Dose increase    Patient Instructions  Medication Instructions:  Your physician has recommended you make the following change in your medication:  STOP: Hydralazine   START: Valsartan  80 mg once daily INCREASE: Coreg  25 mg twice daily  *If you need a refill on your cardiac medications before your next appointment,  please call your pharmacy*  Testing/Procedures: Please schedule echo.   Follow-Up: At [facility], you and your health needs are our priority.  As part of our continuing mission to provide you with exceptional heart care, our providers are all part of one  team.  This team includes your primary Cardiologist (physician) and Advanced Practice Providers or APPs (Physician Assistants and Nurse Practitioners) who all work together to provide you with the care you need, when you need it.  Your next appointment:   4-6 week(s) doublebook   Provider:   Salah Nakamura, DO    Other Instructions:     Adopting a Healthy Lifestyle.  Know what a healthy weight is for you (roughly BMI <25) and aim to maintain this   Aim for 7+ servings of fruits and vegetables daily   65-80+ fluid ounces of water or unsweet tea for healthy kidneys   Limit to max 1 drink of alcohol per day; avoid smoking/tobacco   Limit animal fats in diet for cholesterol and heart health - choose grass fed whenever available   Avoid highly processed foods, and foods high in saturated/trans fats   Aim for low stress - take time to unwind and care for your mental health   Aim for 150 min of moderate intensity exercise weekly for heart health, and weights twice weekly for bone health   Aim for 7-9 hours of sleep daily   When it comes to diets, agreement about the perfect plan isnt easy to find, even among the experts. Experts at the [facility] [employer] developed an idea known as the Healthy Eating Plate. Just imagine a plate divided into logical, healthy portions.   The emphasis is on diet quality:   Load up on vegetables and fruits - one-half of your plate: Aim for color and variety, and remember that potatoes dont count.   Go for whole grains - one-quarter of your plate: Whole wheat, barley, wheat berries, quinoa, oats, brown rice, and foods made with them. If you want pasta, go with whole wheat  pasta.   Protein power - one-quarter of your plate: Fish, chicken, beans, and nuts are all healthy, versatile protein sources. Limit red meat.   The diet, however, does go beyond the plate, offering a few other suggestions.   Use healthy plant oils, such as olive, canola, soy, corn, sunflower and peanut. Check the labels, and avoid partially hydrogenated oil, which have unhealthy trans fats.   If youre thirsty, drink water. Coffee and tea are good in moderation, but skip sugary drinks and limit milk and dairy products to one or two daily servings.   The type of carbohydrate in the diet is more important than the amount. Some sources of carbohydrates, such as vegetables, fruits, whole grains, and beans-are healthier than others.   Finally, stay active  Signed, Dub Huntsman, DO  11/20/2023 1:32 PM    [facility]

## 2023-11-21 ENCOUNTER — Other Ambulatory Visit: Payer: Self-pay

## 2023-11-21 MED ORDER — LEVETIRACETAM ER 500 MG PO TB24
1000.0000 mg | ORAL_TABLET | Freq: Every day | ORAL | 0 refills | Status: DC
  Filled 2023-11-21: qty 60, 30d supply, fill #0

## 2023-12-03 ENCOUNTER — Other Ambulatory Visit: Payer: MEDICAID

## 2023-12-03 NOTE — Patient Instructions (Signed)
 Steven Higgins - I am sorry I was unable to reach you today for our scheduled appointment. I work with [name], NP and am calling to support your healthcare needs. Please contact me at [phone] at your earliest convenience. I look forward to speaking with you soon.   Thank you,   Laymon Doll, BSW [place]/VBCI - [facility] Social Worker [phone]

## 2023-12-10 ENCOUNTER — Other Ambulatory Visit: Payer: Self-pay | Admitting: Adult Health

## 2023-12-11 ENCOUNTER — Telehealth: Payer: Self-pay | Admitting: Cardiology

## 2023-12-11 ENCOUNTER — Other Ambulatory Visit: Payer: Self-pay

## 2023-12-11 NOTE — Telephone Encounter (Signed)
°*  STAT* If patient is at the pharmacy, call can be transferred to refill team.   1. Which medications need to be refilled? (please list name of each medication and dose if known)   levETIRAcetam  (KEPPRA  XR) 500 MG 24 hr tablet     2. Would you like to learn more about the convenience, safety, & potential cost savings by using the [facility]? No     3. Are you open to using the [facility] (Type [facility].  ). No    4. Which pharmacy/location (including street and city if local pharmacy) is medication to be sent to?[facility]    5. Do they need a 30 day or 90 day supply? 30 day    Pt would like to know if Dr would prescribe medication

## 2023-12-11 NOTE — Telephone Encounter (Signed)
 Spoke with patient and deferred him to neurology for this medication as they are managing it.

## 2023-12-17 ENCOUNTER — Encounter: Payer: Self-pay | Admitting: Cardiology

## 2023-12-17 ENCOUNTER — Ambulatory Visit: Payer: MEDICAID | Admitting: Cardiology

## 2023-12-17 ENCOUNTER — Other Ambulatory Visit: Payer: Self-pay

## 2023-12-17 VITALS — BP 180/84 | HR 74 | Ht 78.0 in | Wt 229.9 lb

## 2023-12-17 DIAGNOSIS — I420 Dilated cardiomyopathy: Secondary | ICD-10-CM | POA: Insufficient documentation

## 2023-12-17 DIAGNOSIS — I130 Hypertensive heart and chronic kidney disease with heart failure and stage 1 through stage 4 chronic kidney disease, or unspecified chronic kidney disease: Secondary | ICD-10-CM | POA: Diagnosis present

## 2023-12-17 MED ORDER — LEVETIRACETAM ER 500 MG PO TB24
1000.0000 mg | ORAL_TABLET | Freq: Every day | ORAL | 0 refills | Status: AC
  Filled 2023-12-17: qty 180, 90d supply, fill #0

## 2023-12-17 MED ORDER — VALSARTAN 160 MG PO TABS
160.0000 mg | ORAL_TABLET | Freq: Two times a day (BID) | ORAL | 3 refills | Status: AC
  Filled 2023-12-17: qty 180, 90d supply, fill #0

## 2023-12-17 NOTE — Patient Instructions (Signed)
 Medication Instructions:  Your physician has recommended you make the following change in your medication:  INCREASE: Valsartan  160 mg twice daily *If you need a refill on your cardiac medications before your next appointment, please call your pharmacy*   Follow-Up: At [facility], you and your health needs are our priority.  As part of our continuing mission to provide you with exceptional heart care, our providers are all part of one team.  This team includes your primary Cardiologist (physician) and Advanced Practice Providers or APPs (Physician Assistants and Nurse Practitioners) who all work together to provide you with the care you need, when you need it.  Your next appointment:   4-6 week(s)  Provider:   Kardie Tobb, DO     Other Instructions:

## 2023-12-17 NOTE — Telephone Encounter (Signed)
 Will prescribe 3 month supply but needs to be scheduled for follow up visit with either myself or MD

## 2023-12-19 NOTE — Progress Notes (Signed)
 Cardiology Office Note:    Date:  12/19/2023   ID:  Zigmond Allensworth, DOB [birth year], MRN [id number]  PCP:  [name], NP  Cardiologist:  Dub Huntsman, DO  Electrophysiologist:  None   Referring MD: [name], NP    I am ok  History of Present Illness:    Steven Higgins is a 53 y.o. 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  He is here for a follow up visit. He offers no cardiovascular concerns.   Past Medical History:  Diagnosis Date   CAD (coronary artery disease), native coronary artery 01/22/2017   coronary Ca score of 143 and mild nonobstructive plaque in the RCA that is tortuous with aneurysmal dilatation and minimal plaque in the LAD and LCx by coronary CTA 07/2016   Carotid artery stenosis 04/04/2016   1-39% bilateral by dopplers 03/2016   Diabetes mellitus (HCC)    Ectatic abdominal aorta    a. f/u due 2024.   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 EF 40-45% by echo 03/2016 presumed due to HTN - nuc with no ischemia, nonobstructive CAD by cor CT 07/2016.   Primary hyperaldosteronism    Renal artery stenosis 04/04/2016   By renal dopplers 03/2016 1-59% but not seen on CT abd 02/2017   Seizure disorder (HCC)    as sequela of small intracerebral bleed    No past surgical history on file.  Current Medications: Current Meds  Medication Sig   atorvastatin  (LIPITOR) 40 MG tablet Take 1 tablet (40 mg total) by mouth every evening.    carvedilol  (COREG ) 25 MG tablet Take 1 tablet (25 mg total) by mouth 2 (two) times daily.   isosorbide  mononitrate (IMDUR ) 60 MG 24 hr tablet Take 1 tablet (60 mg total) by mouth daily.   metFORMIN  (GLUCOPHAGE ) 500 MG tablet Take 1 tablet (500 mg total) by mouth 2 (two) times daily with a meal.   valsartan  (DIOVAN ) 160 MG tablet Take 1 tablet (160 mg total) by mouth 2 (two) times daily.   [DISCONTINUED] levETIRAcetam  (KEPPRA  XR) 500 MG 24 hr tablet Take 2 tablets (1,000 mg total) by mouth at bedtime.   [DISCONTINUED] valsartan  (DIOVAN ) 80 MG tablet Take 1 tablet (80 mg total) by mouth daily.     Allergies:   Bee pollen and Lisinopril    Social History   Socioeconomic History   Marital status: Single    Spouse name: Not on file   Number of children: Not on file   Years of education: Not on file   Highest education level: Not on file  Occupational History   Not on file  Tobacco Use   Smoking status: Every Day    Current packs/day: 0.25    Types: Cigarettes   Smokeless tobacco: Never   Tobacco comments:    07/30/16 smoke 3-4 cigarettes per day-not wearing patch  Vaping Use   Vaping status: Never Used  Substance and Sexual Activity   Alcohol use: No    Alcohol/week:  1.0 standard drink of alcohol    Types: 1 Cans of beer per week   Drug use: No   Sexual activity: Not Currently  Other Topics Concern   Not on file  Social History Narrative   Not on file   Social Drivers of Health   Tobacco Use: High Risk (12/17/2023)   Patient History    Smoking Tobacco Use: Every Day    Smokeless Tobacco Use: Never    Passive Exposure: Not on file  Financial Resource Strain: High Risk (02/13/2023)   Overall Financial Resource Strain (CARDIA)    Difficulty of Paying Living Expenses: Very hard  Food Insecurity: Food Insecurity Present (11/13/2023)   Epic    Worried About Programme Researcher, Broadcasting/film/video in the Last Year: Sometimes true    Ran Out of Food in the Last Year: Sometimes true  Transportation  Needs: Unmet Transportation Needs (11/13/2023)   Epic    Lack of Transportation (Medical): Yes    Lack of Transportation (Non-Medical): Yes  Physical Activity: Unknown (02/13/2023)   Exercise Vital Sign    Days of Exercise per Week: Patient declined    Minutes of Exercise per Session: Not on file  Stress: Stress Concern Present (02/13/2023)   [employer] of Occupational Health - Occupational Stress Questionnaire    Feeling of Stress : To some extent  Social Connections: Unknown (02/13/2023)   Social Connection and Isolation Panel    Frequency of Communication with Friends and Family: More than three times a week    Frequency of Social Gatherings with Friends and Family: Once a week    Attends Religious Services: 1 to 4 times per year    Active Member of [employer] or Organizations: Not on file    Attends Banker Meetings: Not on file    Marital Status: Patient declined  Depression (PHQ2-9): Low Risk (08/15/2022)   Depression (PHQ2-9)    PHQ-2 Score: 0  Alcohol Screen: Low Risk (02/13/2023)   Alcohol Screen    Last Alcohol Screening Score (AUDIT): 3  Housing: High Risk (11/13/2023)   Epic    Unable to Pay for Housing in the Last Year: Yes    Number of Times Moved in the Last Year: Not on file    Homeless in the Last Year: Yes  Utilities: Not At Risk (11/13/2023)   Epic    Threatened with loss of utilities: No  Health Literacy: Not on file     Family History: The patient's family history includes Diabetes in his maternal grandmother and mother; Heart attack in his father; Heart disease in his father and mother; Hypertension in his father, maternal grandmother, mother, and paternal grandmother.  ROS:   Review of Systems  Constitution: Negative for decreased appetite, fever and weight gain.  HENT: Negative for congestion, ear discharge, hoarse voice and sore throat.   Eyes: Negative for discharge, redness, vision loss in right eye and visual halos.  Cardiovascular:  Negative for chest pain, dyspnea on exertion, leg swelling, orthopnea and palpitations.  Respiratory: Negative for cough, hemoptysis, shortness of breath and snoring.   Endocrine: Negative for heat intolerance and polyphagia.  Hematologic/Lymphatic: Negative for bleeding problem. Does not bruise/bleed easily.  Skin: Negative for flushing, nail changes, rash and suspicious lesions.  Musculoskeletal: Negative for arthritis, joint pain, muscle cramps, myalgias, neck pain and stiffness.  Gastrointestinal: Negative for abdominal pain, bowel incontinence, diarrhea and excessive appetite.  Genitourinary: Negative for decreased libido, genital sores and incomplete emptying.  Neurological: Negative for brief  paralysis, focal weakness, headaches and loss of balance.  Psychiatric/Behavioral: Negative for altered mental status, depression and suicidal ideas.  Allergic/Immunologic: Negative for HIV exposure and persistent infections.    EKGs/Labs/Other Studies Reviewed:    The following studies were reviewed today:   EKG:  None today   Recent Labs: 10/03/2023: ALT 16; BUN 15; Creatinine, Ser 1.52; Hemoglobin 16.3; Platelets 280; Potassium 4.1; Sodium 136  Recent Lipid Panel    Component Value Date/Time   CHOL 258 (H) 10/03/2023 1405   TRIG 267 (H) 10/03/2023 1405   HDL 52 10/03/2023 1405   CHOLHDL 5.0 10/03/2023 1405   CHOLHDL 4.3 03/16/2016 0705   VLDL 16 03/16/2016 0705   LDLCALC 157 (H) 10/03/2023 1405    Physical Exam:    VS:  BP (!) 180/84 (BP Location: Left Arm, Patient Position: Sitting, Cuff Size: Normal)   Pulse 74   Ht 6' 6 (1.981 m)   Wt 229 lb 14.4 oz (104.3 kg)   SpO2 97%   BMI 26.57 kg/m     Wt Readings from Last 3 Encounters:  12/17/23 229 lb 14.4 oz (104.3 kg)  11/15/23 222 lb 12.8 oz (101.1 kg)  11/05/23 227 lb 8 oz (103.2 kg)     GEN: Well nourished, well developed in no acute distress HEENT: Normal NECK: No JVD; No carotid bruits LYMPHATICS: No  lymphadenopathy CARDIAC: S1S2 noted,RRR, no murmurs, rubs, gallops RESPIRATORY:  Clear to auscultation without rales, wheezing or rhonchi  ABDOMEN: Soft, non-tender, non-distended, +bowel sounds, no guarding. EXTREMITIES: No edema, No cyanosis, no clubbing MUSCULOSKELETAL:  No deformity  SKIN: Warm and dry NEUROLOGIC:  Alert and oriented x 3, non-focal PSYCHIATRIC:  Normal affect, good insight  ASSESSMENT:    1. Hypertensive heart and chronic kidney disease with heart failure and stage 1 through stage 4 chronic kidney disease, or chronic kidney disease (HCC)   2. DCM (dilated cardiomyopathy) (HCC)      PLAN:    Hypertensive heart disease- he is still hypertensive. He states that he takes his medications as prescribed. Will increase Valsartan  to 160 mg BID, continue current dose of coreg  25 mg BID    He definitely still need a repeat echocardiogram in 2018 EF is 40 to 45%. This is scheduled for 12/23.  Coronary artery disease-he does not complain of any anginal symptoms.  Will continue the patient on his aspirin  and atorvastatin .  CKD III -avoid nephrotoxins.    The patient is in agreement with the above plan. The patient left the office in stable condition.  The patient will follow up in   Medication Adjustments/Labs and Tests Ordered: Current medicines are reviewed at length with the patient today.  Concerns regarding medicines are outlined above.  No orders of the defined types were placed in this encounter.  Meds ordered this encounter  Medications   valsartan  (DIOVAN ) 160 MG tablet    Sig: Take 1 tablet (160 mg total) by mouth 2 (two) times daily.    Dispense:  180 tablet    Refill:  3    Patient Instructions  Medication Instructions:  Your physician has recommended you make the following change in your medication:  INCREASE: Valsartan  160 mg twice daily *If you need a refill on your cardiac medications before your next appointment, please call your  pharmacy*   Follow-Up: At [facility], you and your health needs are our priority.  As part of our continuing mission to provide you with exceptional heart care, our providers are all  part of one team.  This team includes your primary Cardiologist (physician) and Advanced Practice Providers or APPs (Physician Assistants and Nurse Practitioners) who all work together to provide you with the care you need, when you need it.  Your next appointment:   4-6 week(s)  Provider:   Ah Bott, DO     Other Instructions:     Adopting a Healthy Lifestyle.  Know what a healthy weight is for you (roughly BMI <25) and aim to maintain this   Aim for 7+ servings of fruits and vegetables daily   65-80+ fluid ounces of water or unsweet tea for healthy kidneys   Limit to max 1 drink of alcohol per day; avoid smoking/tobacco   Limit animal fats in diet for cholesterol and heart health - choose grass fed whenever available   Avoid highly processed foods, and foods high in saturated/trans fats   Aim for low stress - take time to unwind and care for your mental health   Aim for 150 min of moderate intensity exercise weekly for heart health, and weights twice weekly for bone health   Aim for 7-9 hours of sleep daily   When it comes to diets, agreement about the perfect plan isnt easy to find, even among the experts. Experts at the [facility] [employer] developed an idea known as the Healthy Eating Plate. Just imagine a plate divided into logical, healthy portions.   The emphasis is on diet quality:   Load up on vegetables and fruits - one-half of your plate: Aim for color and variety, and remember that potatoes dont count.   Go for whole grains - one-quarter of your plate: Whole wheat, barley, wheat berries, quinoa, oats, brown rice, and foods made with them. If you want pasta, go with whole wheat pasta.   Protein power - one-quarter of your plate: Fish, chicken, beans, and  nuts are all healthy, versatile protein sources. Limit red meat.   The diet, however, does go beyond the plate, offering a few other suggestions.   Use healthy plant oils, such as olive, canola, soy, corn, sunflower and peanut. Check the labels, and avoid partially hydrogenated oil, which have unhealthy trans fats.   If youre thirsty, drink water. Coffee and tea are good in moderation, but skip sugary drinks and limit milk and dairy products to one or two daily servings.   The type of carbohydrate in the diet is more important than the amount. Some sources of carbohydrates, such as vegetables, fruits, whole grains, and beans-are healthier than others.   Finally, stay active  Signed, Dub Huntsman, DO  12/19/2023 7:09 PM    [facility]

## 2023-12-23 ENCOUNTER — Other Ambulatory Visit: Payer: Self-pay

## 2023-12-24 ENCOUNTER — Ambulatory Visit (HOSPITAL_COMMUNITY): Payer: MEDICAID | Attending: Internal Medicine

## 2023-12-24 ENCOUNTER — Telehealth (HOSPITAL_COMMUNITY): Payer: Self-pay | Admitting: Cardiology

## 2023-12-24 NOTE — Telephone Encounter (Signed)
 Patient NO SHOWED echocardiogram 12/24/23. We will not reach out to reschedule due to NO SHOW RATE 19%. If patient calls back to reschedule we will reinstate the order.

## 2024-01-01 ENCOUNTER — Other Ambulatory Visit: Payer: Self-pay

## 2024-01-01 ENCOUNTER — Other Ambulatory Visit: Payer: MEDICAID

## 2024-01-01 DIAGNOSIS — I132 Hypertensive heart and chronic kidney disease with heart failure and with stage 5 chronic kidney disease, or end stage renal disease: Secondary | ICD-10-CM

## 2024-01-01 DIAGNOSIS — E785 Hyperlipidemia, unspecified: Secondary | ICD-10-CM

## 2024-01-01 DIAGNOSIS — Z720 Tobacco use: Secondary | ICD-10-CM

## 2024-01-01 DIAGNOSIS — N186 End stage renal disease: Secondary | ICD-10-CM

## 2024-01-01 MED ORDER — VARENICLINE TARTRATE (STARTER) 0.5 MG X 11 & 1 MG X 42 PO TBPK
ORAL_TABLET | ORAL | 0 refills | Status: AC
  Filled 2024-01-01: qty 53, 30d supply, fill #0

## 2024-01-01 NOTE — Progress Notes (Signed)
 "  01/01/2024 Name: Steven Higgins MRN: [id number] DOB: July 09, 1970  Chief Complaint  Patient presents with   Hypertension    Steven Higgins is a 53 y.o. year old male who presented for a telephone visit.   They were referred to the pharmacist by their PCP for assistance in managing hypertension and hyperlipidemia/cardiovascular risk reduction. Steven Higgins PMH includes secondary HTN due to hyperaldosteronism, CAD, CHF (EF 40-45% in 2018), T2DM, seizure disorder, tobacco use, HLD, CKD, hx of ICH due to malignant HTN.   Subjective: Patient was last seen by PCP, Steven Borer, Higgins, on 10/03/23. At last visit, patient described recent dizzy spells, racing HR, and elevated BP.  HR was 127 bpm. He was seen by Dr. Sheena that same day. He was instructed to increase carvedilol  to 12.5 mg BID. At f/u appt with Dr. Sheena on 11/15/23, BP was 180/90 mmHg. He was instructed to stop hydralazine , start valsartan  80 mg daily, and increase carvedilol  to 25 mg BID. At pharmacy call on 11/19/23, patient endorses taking medications as prescribed. Assisted in refilling isosorbide  and Keppra . Instructed to increase atorvastatin  to 40 mg daily. At cardiology f/u on 12/17/23, BP was 180/84 mmHg.Valsartan  was increased to 160 mg BID.  Today, patient reports doing ok. Confirms he made the changes instructed by Dr. Sheena. But, he does miss evening doses of carvedilol  and valsartan  occasionally. Home BP relatively unchanged since increasing medication. Patient does have caffeine and nicotine  during the day. He is homeless, difficult to control is diet. Aware that he needs to reschedule echo.   Care Team: Primary Care Provider: Borer Steven RAMAN, Higgins ; Next Scheduled Visit: 01/03/24 Cardiologist: Steven Tobb, DO; Next Scheduled Visit: 01/29/24  Medication Access/Adherence  Current Pharmacy:  [facility] [address], [address] [place] [address] Phone: [phone] Fax:  [phone]   Patient reports affordability concerns with their medications: Yes  - counseled on ability to use charge account at [facility] with [facility] insurance Patient reports access/transportation concerns to their pharmacy: No  Patient reports adherence concerns with their medications:  Yes  - hx of noncompliance with medications  Reports occasionally missing PM doses of medications (valsartan , carvedilol )   Diabetes:  Current medications: metformin  XR 500 mg BID   Hyperlipidemia/ASCVD Risk Reduction  Current lipid lowering medications: atorvastatin  20 mg daily  Antiplatelet regimen: none  ASCVD History: Coronary CTA in July 2018 - CAC of 143 (98th percentile), mild non-obstructive plaque in RCA, LAD, LCX Family History: Heart attack in his father; Heart disease in his father and mother  Risk Factors:   Heart Failure (EF 40-45% in 2018):  Current medications:  ACEi/ARB/ARNI: valsartan  160 mg BID (Entresto discontinued in 2022 due to AKI) SGLT2i: none Beta blocker: carvedilol  25 mg BID Mineralocorticoid Receptor Antagonist: none - spironolactone  discontinued in 2022 due to AKI Diuretic regimen: none  Current home blood pressure readings: 161/90 mmHg, SBP mostly 150-160s since starting valsartan  12/30/23: AM 185/126 mmHg, HR 127 (before medicine), 124/107 mmHg (after medicine), HR 82 12/31/23: AM 169/118 mmHg, HR 117, PM 157/11 mmHg, HR 121 01/01/24: AM 160/115 mmHg HR, 117, PM 163/118 mmHg  Current dietary habits: - Has 2 cups of coffee every day - Reports that he is smoking cigarettes, 10-12 per day (smoking the most at night).  Has tried nicotine  patch in the past but felt it made his cravings worse. Says he has been trying to quit for 7 years.  Patient denies volume overload signs or symptoms including  shortness of breath, lower extremity edema, increased use of pillows at night  Objective:  BP Readings from Last 3 Encounters:  12/17/23 (!) 180/84   11/15/23 (!) 180/90  11/05/23 (!) 160/90    Lab Results  Component Value Date   HGBA1C 6.3 (A) 02/14/2023   HGBA1C 6.2 (A) 08/15/2022   HGBA1C 6.0 (A) 05/03/2022       Latest Ref Rng & Units 10/03/2023    2:05 PM 10/29/2022    1:20 PM 08/15/2022   12:18 PM  BMP  Glucose 70 - 99 mg/dL 899  832  73   BUN 6 - 24 mg/dL 15  11  11    Creatinine 0.76 - 1.27 mg/dL 8.47  8.31  8.69   BUN/Creat Ratio 9 - 20 10   8    Sodium 134 - 144 mmol/L 136  137  137   Potassium 3.5 - 5.2 mmol/L 4.1  3.8  4.2   Chloride 96 - 106 mmol/L 100  96  101   CO2 20 - 29 mmol/L 22  13  23    Calcium  8.7 - 10.2 mg/dL 89.9  9.6  9.9     Lab Results  Component Value Date   CHOL 258 (H) 10/03/2023   HDL 52 10/03/2023   LDLCALC 157 (H) 10/03/2023   TRIG 267 (H) 10/03/2023   CHOLHDL 5.0 10/03/2023    Medications Reviewed Today     Reviewed by Steven Higgins, RPH-CPP (Pharmacist) on 01/01/24 at 1408  Med List Status: <None>   Medication Order Taking? Sig Documenting Provider Last Dose Status Informant  atorvastatin  (LIPITOR) 40 MG tablet [id number] Yes Take 1 tablet (40 mg total) by mouth every evening. Steven Steven RAMAN, Higgins  Active   carvedilol  (COREG ) 25 MG tablet [id number] Yes Take 1 tablet (25 mg total) by mouth 2 (two) times daily. Higgins, Kardie, DO  Active   isosorbide  mononitrate (IMDUR ) 60 MG 24 hr tablet [id number] Yes Take 1 tablet (60 mg total) by mouth daily. Steven Steven RAMAN, Higgins  Active   levETIRAcetam  (KEPPRA  XR) 500 MG 24 hr tablet [id number] Yes Take 2 tablets (1,000 mg total) by mouth at bedtime.PT NEEDS TO CALL OFFICE AND SCHEDULE FU. HASN'T BEEN SEEN IN LAST Steven Higgins, Steven Higgins  Active   metFORMIN  (GLUCOPHAGE ) 500 MG tablet [id number] Yes Take 1 tablet (500 mg total) by mouth 2 (two) times daily with a meal. Steven Steven RAMAN, Higgins  Active   valsartan  (DIOVAN ) 160 MG tablet [id number] Yes Take 1 tablet (160 mg total) by mouth 2 (two) times daily. Higgins, Kardie, DO  Active   Med List Note Steven Higgins, CPhT 11/20/20 1142): Steven Higgins              Assessment/Plan:   Diabetes: - Currently controlled; goal A1c <7%.  - Reviewed long term cardiovascular and renal outcomes of uncontrolled blood sugar. - Recommend to continue metformin  500 mg BID. - A1C due NOW   Hyperlipidemia/ASCVD Risk Reduction: - Currently uncontrolled with most recent LDL-C of 157 mg/dL above goal < 70 mg/dL given nonadherence to statin. Patient is a good candidate for high intensity statin with multiple ASCVD risk factors. Lipid panel has not been rechecked since increasing statin. Patient interested in starting medication for smoking cessation. - Reviewed long term complications of uncontrolled cholesterol - Reviewed lifestyle recommendations to lower LDL-C including regular physical activity, 5-10% weight loss, eating a diet low in saturated fat, and increasing intake of fiber to at least 25 g  per day. - Recommend to continue atorvastatin  40 mg daily  - Placed orders for repeat lipid panel today - Recommend to START varenicline  0.5 mg daily for 3 days, then 0.5 mg twice daily for 4 days, then 1 mg twice daily thereafter. Counseled to take with food to reduce risk of GI adverse effects. Also counseled to stop therapy and contact provider if change in mood.     HFrEF and HTN: - HTN currently uncontrolled with home and clinic BP consistently above goal < 130/80 mmHg. He is not symptomatic from a HF standpoint. Patient has a diagnosis of hyperaldosteronism, but appears spironolactone  was discontinued in 2022 after he was hospitalized with an AKI in the setting of dehydration. May be appropriate to retrial low dose MRA in the future if BP continues to be uncontrolled, but will defer to cardiology. Noted that patient has not had BMP since starting valsartan  in November. Will obtain at PCP appt on 01/03/24.  - Reviewed appropriate blood pressure monitoring technique and reviewed goal blood pressure - Reviewed dietary  modifications including limiting salt intake - Recommend to continue carvedilol  25 mg BID, valsartan  160 mg BID, isosorbide  mononitrate 60 mg daily  - Counseled on importance of adherence to taking BID medications as prescribed - Placed orders for BMP today   Patient verbalized understanding of treatment plan. Assisted in requesting refill of Keppra , and asked pharmacy to fill isosorbide  and higher dose of atorvastatin  today.    Follow Up Plan:  Pharmacist telephone 02/04/24 PCP clinic visit in 01/03/24   Lorain Baseman, PharmD [facility] [phone]   "

## 2024-01-03 ENCOUNTER — Other Ambulatory Visit: Payer: Self-pay

## 2024-01-03 ENCOUNTER — Encounter: Payer: Self-pay | Admitting: Nurse Practitioner

## 2024-01-03 ENCOUNTER — Ambulatory Visit (INDEPENDENT_AMBULATORY_CARE_PROVIDER_SITE_OTHER): Payer: MEDICAID | Admitting: Nurse Practitioner

## 2024-01-03 VITALS — BP 149/76 | HR 67 | Temp 96.7°F | Wt 227.8 lb

## 2024-01-03 DIAGNOSIS — N186 End stage renal disease: Secondary | ICD-10-CM | POA: Diagnosis not present

## 2024-01-03 DIAGNOSIS — E1169 Type 2 diabetes mellitus with other specified complication: Secondary | ICD-10-CM

## 2024-01-03 DIAGNOSIS — I132 Hypertensive heart and chronic kidney disease with heart failure and with stage 5 chronic kidney disease, or end stage renal disease: Secondary | ICD-10-CM | POA: Diagnosis not present

## 2024-01-03 DIAGNOSIS — E785 Hyperlipidemia, unspecified: Secondary | ICD-10-CM | POA: Diagnosis not present

## 2024-01-03 DIAGNOSIS — R569 Unspecified convulsions: Secondary | ICD-10-CM

## 2024-01-03 LAB — POCT GLYCOSYLATED HEMOGLOBIN (HGB A1C): Hemoglobin A1C: 6.6 % — AB (ref 4.0–5.6)

## 2024-01-03 NOTE — Progress Notes (Signed)
 "  Subjective   Patient ID: Steven Higgins, male    DOB: 07-04-1970, 54 y.o.   MRN: [id number]  Chief Complaint  Patient presents with   Diabetes    Discuss paperwork for Social Security.     Referring provider: Oley Steven RAMAN, NP  Steven Higgins is a 54 y.o. male with Past Medical History: 01/22/2017: CAD (coronary artery disease), native coronary artery     Comment:  coronary Ca score of 143 and mild nonobstructive plaque               in the RCA that is tortuous with aneurysmal dilatation               and minimal plaque in the LAD and LCx by coronary CTA               07/2016 04/04/2016: Carotid artery stenosis     Comment:  1-39% bilateral by dopplers 03/2016 No date: Diabetes mellitus (HCC) No date: Ectatic abdominal aorta     Comment:  a. f/u due 2024. 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 EF 40-45% by echo 03/2016 presumed due to HTN - nuc               with no ischemia, nonobstructive CAD by cor CT 07/2016. No date: Primary hyperaldosteronism 04/04/2016: Renal artery stenosis     Comment:  By renal dopplers 03/2016 1-59% but not seen on CT abd               02/2017 No date: Seizure disorder [facility])     Comment:  as sequela of small intracerebral bleed   HPI  Steven Higgins is a 54 y.o. year old male patient with a history of type 2 diabetes mellitus, hypertension, hyperlipidemia.  Hyperaldosteronism, seizures.   Hypertension:   Patient presents for a follow up. States that he checks blood pressure at home. States that blood pressures have been fluctuating. He is compliant with medications. Does need refills. states meds make him feel bad.  Blood pressure today in office was elevated.  Patient stated that he took his blood pressure medicine right before coming into the office and it may have  not had enough time to take effect.     Diabetes:   Patient states that he checks his blood sugar at home and it is usually within normal range. Compliant with medications. blood sugars in normal range. A1C today is 6.6.     Allergies[1]  Immunization History  Administered Date(s) Administered   Tdap 11/19/2020    Tobacco History: Tobacco Use History[2] Ready to quit: Not Answered Counseling given: Not Answered Tobacco comments: 07/30/16 smoke 3-4 cigarettes per day-not wearing patch   Outpatient Encounter Medications as of 01/03/2024  Medication Sig   atorvastatin  (LIPITOR) 40 MG tablet Take 1 tablet (40 mg total) by mouth every evening.   carvedilol  (COREG ) 25 MG tablet Take 1 tablet (25 mg total) by mouth 2 (two) times daily.   isosorbide  mononitrate (IMDUR ) 60 MG 24 hr tablet Take 1 tablet (60 mg total) by mouth daily.   levETIRAcetam  (KEPPRA  XR) 500 MG 24 hr tablet Take 2 tablets (1,000 mg total) by mouth at bedtime.PT NEEDS TO CALL OFFICE AND SCHEDULE FU. HASN'T BEEN SEEN IN LAST YR   metFORMIN  (GLUCOPHAGE )  500 MG tablet Take 1 tablet (500 mg total) by mouth 2 (two) times daily with a meal.   valsartan  (DIOVAN ) 160 MG tablet Take 1 tablet (160 mg total) by mouth 2 (two) times daily.   Varenicline  Tartrate, Starter, 0.5 MG X 11 & 1 MG X 42 TBPK Take 0.5 mg daily for 3 days, then 0.5 mg twice daily for 4 days, then 1 mg twice daily thereafter as directed in the pack. Take with food.   No facility-administered encounter medications on file as of 01/03/2024.    Review of Systems  Review of Systems  Constitutional: Negative.   HENT: Negative.    Cardiovascular: Negative.   Gastrointestinal: Negative.   Allergic/Immunologic: Negative.   Neurological: Negative.   Psychiatric/Behavioral: Negative.       Objective:   BP (!) 149/76 (BP Location: Left Arm, Patient Position: Sitting, Cuff Size: Normal)   Pulse 67   Temp (!) 96.7 F (35.9 C) (Temporal)   Wt 227 lb 12.8 oz  (103.3 kg)   SpO2 93%   BMI 26.32 kg/m   Wt Readings from Last 5 Encounters:  01/03/24 227 lb 12.8 oz (103.3 kg)  12/17/23 229 lb 14.4 oz (104.3 kg)  11/15/23 222 lb 12.8 oz (101.1 kg)  11/05/23 227 lb 8 oz (103.2 kg)  10/03/23 224 lb (101.6 kg)     Physical Exam Vitals and nursing note reviewed.  Constitutional:      General: He is not in acute distress.    Appearance: He is well-developed.  Cardiovascular:     Rate and Rhythm: Normal rate and regular rhythm.  Pulmonary:     Effort: Pulmonary effort is normal.     Breath sounds: Normal breath sounds.  Skin:    General: Skin is warm and dry.  Neurological:     Mental Status: He is alert and oriented to person, place, and time.       Assessment & Plan:   Type 2 diabetes mellitus with other specified complication, without long-term current use of insulin  (HCC) -     POCT glycosylated hemoglobin (Hb A1C)  Seizures (HCC) -     Ambulatory referral to Neurology     Return in about 3 months (around 04/02/2024).   Steven GORMAN Borer, NP 01/03/2024     [1]  Allergies Allergen Reactions   Bee Pollen     unknown   Lisinopril  Cough  [2]  Social History Tobacco Use  Smoking Status Every Day   Current packs/day: 0.25   Types: Cigarettes  Smokeless Tobacco Never  Tobacco Comments   07/30/16 smoke 3-4 cigarettes per day-not wearing patch   "

## 2024-01-04 LAB — BASIC METABOLIC PANEL WITH GFR
BUN/Creatinine Ratio: 11 (ref 9–20)
BUN: 16 mg/dL (ref 6–24)
CO2: 25 mmol/L (ref 20–29)
Calcium: 9.7 mg/dL (ref 8.7–10.2)
Chloride: 99 mmol/L (ref 96–106)
Creatinine, Ser: 1.46 mg/dL — ABNORMAL HIGH (ref 0.76–1.27)
Glucose: 104 mg/dL — ABNORMAL HIGH (ref 70–99)
Potassium: 4 mmol/L (ref 3.5–5.2)
Sodium: 139 mmol/L (ref 134–144)
eGFR: 58 mL/min/1.73 — ABNORMAL LOW

## 2024-01-04 LAB — LIPID PANEL
Chol/HDL Ratio: 2.7 ratio (ref 0.0–5.0)
Cholesterol, Total: 149 mg/dL (ref 100–199)
HDL: 56 mg/dL
LDL Chol Calc (NIH): 77 mg/dL (ref 0–99)
Triglycerides: 83 mg/dL (ref 0–149)
VLDL Cholesterol Cal: 16 mg/dL (ref 5–40)

## 2024-01-06 ENCOUNTER — Ambulatory Visit: Payer: Self-pay

## 2024-01-14 ENCOUNTER — Telehealth: Payer: Self-pay | Admitting: Pharmacy Technician

## 2024-01-14 ENCOUNTER — Other Ambulatory Visit: Payer: Self-pay

## 2024-01-14 NOTE — Progress Notes (Addendum)
 "  01/14/2024 Name: Steven Higgins MRN: [id number] DOB: 1970-12-20  Patient is appearing for a follow-up visit with the population health pharmacy technician. Last engaged with the clinical pharmacist to discuss hypertension on 01/01/2024. Contacted patient today to discuss diabetes, hypertension, medication adherence, medication access, and smoking cessation.   Plan from last clinical pharmacist appointment:  Diabetes: - Currently controlled; goal A1c <7%.  - Reviewed long term cardiovascular and renal outcomes of uncontrolled blood sugar. - Recommend to continue metformin  500 mg BID. - A1C due NOW Hyperlipidemia/ASCVD Risk Reduction: - Currently uncontrolled with most recent LDL-C of 157 mg/dL above goal < 70 mg/dL given nonadherence to statin. Patient is a good candidate for high intensity statin with multiple ASCVD risk factors. Lipid panel has not been rechecked since increasing statin. Patient interested in starting medication for smoking cessation. - Reviewed long term complications of uncontrolled cholesterol - Reviewed lifestyle recommendations to lower LDL-C including regular physical activity, 5-10% weight loss, eating a diet low in saturated fat, and increasing intake of fiber to at least 25 g per day. - Recommend to continue atorvastatin  40 mg daily  - Placed orders for repeat lipid panel today - Recommend to START varenicline  0.5 mg daily for 3 days, then 0.5 mg twice daily for 4 days, then 1 mg twice daily thereafter. Counseled to take with food to reduce risk of GI adverse effects. Also counseled to stop therapy and contact provider if change in mood.  HFrEF and HTN: - HTN currently uncontrolled with home and clinic BP consistently above goal < 130/80 mmHg. He is not symptomatic from a HF standpoint. Patient has a diagnosis of hyperaldosteronism, but appears spironolactone  was discontinued in 2022 after he was hospitalized with an AKI in the setting of dehydration. May be appropriate  to retrial low dose MRA in the future if BP continues to be uncontrolled, but will defer to cardiology. Noted that patient has not had BMP since starting valsartan  in November. Will obtain at PCP appt on 01/03/24.  - Reviewed appropriate blood pressure monitoring technique and reviewed goal blood pressure - Reviewed dietary modifications including limiting salt intake - Recommend to continue carvedilol  25 mg BID, valsartan  160 mg BID, isosorbide  mononitrate 60 mg daily  - Counseled on importance of adherence to taking BID medications as prescribed - Placed orders for BMP today -Patient verbalized understanding of treatment plan. Assisted in requesting refill of Keppra , and asked pharmacy to fill isosorbide  and higher dose of atorvastatin  today.  -Follow Up Plan:  Pharmacist telephone 02/04/24 PCP clinic visit in 01/03/24(copy/paste from last note)   Medication Adherence Barriers Identified:  Patient made recommended medication changes per plan: Yes patient informs he is taking his medications. He informs he takes Atorvastatin  and Isosorbide  daily. He also informs he takes Carvedilol , Valsartan  and Metformin  twice a day.  He informs he did pick up Chantix  and that it is working perfectly. When inquired how many cigarettes he is smoking daily he informs I'm not smoking at all. Access issues with any new medication or testing device: Yes Patient informs he needs a refill for Atorvastatin . Called [facility] and re ordered patient's Atorvastatin . Attempted to get 90 day supply filled but the CPhT informs the prescription was written for 30 at a time and he only had 60 tablets remaining on prescription so she could fill for 30 tablets. She was able to fill Metformin  for a 90 day supply. That medictaion was last filled on 10/2 for 180 tablets so it was due as  well. Most of his other medications were not due at this time. Carvedilol  last filled for 180 on 11/14, Isosorbide  for 90 on 11/20, Valsartan  for 80 on 12/16,  Chantix  on 1/2 and Keppra  on 12/22 for 180 tablets.  Patient is checking blood pressure readings as prescribed: Yes Patient informs he is checking his blood pressure daily. When inquired if he could provide me with some readings, he informs not at this time.' He informs the readings are coming down and the systolic number is running in the 130s-140s.He remembers one reading of 139/81 in the last week or so. Patient informs he called [facility] to reschedule his echo last week but no one has called him back. Encouraged patient to call them back at end of this week or early next week to get this scheduled. He informs he is to have it done prior to his appointment on 1/28 with Dr. Dub Huntsman. Again, encouraged him to call them back.   Medication Adherence Barriers Addressed/Actions Taken:  Reviewed medication changes per plan from last clinical pharmacist note Medication Access for Atorvastatin  refill (per patient this is needed) and Metformin  (this appears due as last filled on October 2 for 90 day supply) Will discuss medication access concerns with pharmacist Collaborated with Patient Advocate team regarding prior authorization Contacted pharmacy regarding refills Educated patient to contact pharmacy regarding refills prior to running out of medication.  Reviewed instructions for monitoring blood pressures at home and reminded patient to keep a written log to review with pharmacist Reminded patient of date/time of upcoming clinical pharmacist follow up and any upcoming PCP/specialists visits. Patient denies transportation barriers to the appointment. Yes  Next clinical pharmacist appointment is scheduled for: 02/04/2024   [facility] 01/21/2024 Called patient back in regard to echo appointment. HIPAA verified. Inquired if [facility] returned his message that he left about rescheduling his echo. He informs they had not returned his call. Provided phone number to patient to call to schedule his echo.  Patient informs he will call today.  Steven Higgins, CPhT [facility] Office: [phone] Email: [email or web address]  "

## 2024-01-17 ENCOUNTER — Other Ambulatory Visit: Payer: Self-pay

## 2024-01-21 ENCOUNTER — Telehealth: Payer: Self-pay | Admitting: Cardiology

## 2024-01-21 NOTE — Telephone Encounter (Signed)
 Pt would like to reschedule his Echo but isn't sure if the insurance would still cover the cost.

## 2024-01-29 ENCOUNTER — Other Ambulatory Visit: Payer: Self-pay

## 2024-01-29 ENCOUNTER — Ambulatory Visit: Payer: MEDICAID | Admitting: Cardiology

## 2024-01-29 DIAGNOSIS — I428 Other cardiomyopathies: Secondary | ICD-10-CM

## 2024-01-29 NOTE — Progress Notes (Signed)
New echo order placed

## 2024-02-04 ENCOUNTER — Other Ambulatory Visit: Payer: Self-pay

## 2024-02-04 ENCOUNTER — Telehealth: Payer: Self-pay

## 2024-02-04 NOTE — Progress Notes (Unsigned)
 "  02/04/2024 Name: Steven Higgins MRN: [id number] DOB: 1970/03/20  No chief complaint on file.   Steven Higgins is a 54 y.o. year old male who presented for a telephone visit.   They were referred to the pharmacist by their PCP for assistance in managing hypertension and hyperlipidemia/cardiovascular risk reduction. 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   Subjective: Patient was last seen by PCP, Bascom Borer, NP, on 01/03/24. BP was 149/76 mmHg, HR 67 bpm. BMP was stable after increasing dose of valsartan  to 160 mg BID. A1C was 6.6%. Patient cancelled cardiology f/u with Dr. Sheena on 01/29/24. He reported to Jill Simcox, CPhT, on 01/14/24, that he was taking Chantix  and not currently smoking. Reported home BP were improving with recent reading of 139/81 mmHg. He did rescheduled echo for 03/02/24.  Today, ***  10/03/23. At last visit, patient described recent dizzy spells, racing HR, and elevated BP.  HR was 127 bpm. He was seen by Dr. Sheena that same day. He was instructed to increase carvedilol  to 12.5 mg BID. At f/u appt with Dr. Sheena on 11/15/23, BP was 180/90 mmHg. He was instructed to stop hydralazine , start valsartan  80 mg daily, and increase carvedilol  to 25 mg BID. At pharmacy call on 11/19/23, patient endorses taking medications as prescribed. Assisted in refilling isosorbide  and Keppra . Instructed to increase atorvastatin  to 40 mg daily. At cardiology f/u on 12/17/23, BP was 180/84 mmHg.Valsartan  was increased to 160 mg BID.  Today, patient reports doing ok. Confirms he made the changes instructed by Dr. Sheena. But, he does miss evening doses of carvedilol  and valsartan  occasionally. Home BP relatively unchanged since increasing medication. Patient does have caffeine and nicotine  during the day. He is homeless, difficult to control is diet. Aware that he needs to reschedule  echo.   Care Team: Primary Care Provider: Borer Bascom RAMAN, NP ; Next Scheduled Visit: 01/03/24 Cardiologist: Kardie Tobb, DO; Next Scheduled Visit: 01/29/24  Medication Access/Adherence  Current Pharmacy:  [facility] [address], [address] [place] [address] Phone: [phone] Fax: [phone]   Patient reports affordability concerns with their medications: Yes  - counseled on ability to use charge account at [facility] with [facility] insurance Patient reports access/transportation concerns to their pharmacy: No  Patient reports adherence concerns with their medications:  Yes  - hx of noncompliance with medications  Reports occasionally missing PM doses of medications (valsartan , carvedilol )   Diabetes:  Current medications: metformin  XR 500 mg BID   Hyperlipidemia/ASCVD Risk Reduction  Current lipid lowering medications: atorvastatin  20 mg daily  Antiplatelet regimen: none  ASCVD History: Coronary CTA in July 2018 - CAC of 143 (98th percentile), mild non-obstructive plaque in RCA, LAD, LCX Family History: Heart attack in his father; Heart disease in his father and mother  Risk Factors:   Heart Failure (EF 40-45% in 2018):  Current medications:  ACEi/ARB/ARNI: valsartan  160 mg BID (Entresto discontinued in 2022 due to AKI) SGLT2i: none Beta blocker: carvedilol  25 mg BID Mineralocorticoid Receptor Antagonist: none - spironolactone  discontinued in 2022 due to AKI Diuretic regimen: none  Current home blood pressure readings: 161/90 mmHg, SBP mostly 150-160s since starting valsartan  12/30/23: AM 185/126 mmHg, HR 127 (before medicine), 124/107 mmHg (after medicine), HR 82 12/31/23: AM 169/118 mmHg, HR 117, PM 157/11 mmHg, HR 121 01/01/24: AM 160/115 mmHg HR,  117, PM 163/118 mmHg  Current dietary habits: - Has 2 cups of coffee every day - Reports that he is smoking cigarettes, 10-12 per day (smoking the most at  night).  Has tried nicotine  patch in the past but felt it made his cravings worse. Says he has been trying to quit for 7 years.  Patient denies volume overload signs or symptoms including shortness of breath, lower extremity edema, increased use of pillows at night  Objective:  BP Readings from Last 3 Encounters:  01/03/24 (!) 149/76  12/17/23 (!) 180/84  11/15/23 (!) 180/90    Lab Results  Component Value Date   HGBA1C 6.6 (A) 01/03/2024   HGBA1C 6.3 (A) 02/14/2023   HGBA1C 6.2 (A) 08/15/2022       Latest Ref Rng & Units 01/03/2024    1:33 PM 10/03/2023    2:05 PM 10/29/2022    1:20 PM  BMP  Glucose 70 - 99 mg/dL 895  899  832   BUN 6 - 24 mg/dL 16  15  11    Creatinine 0.76 - 1.27 mg/dL 8.53  8.47  8.31   BUN/Creat Ratio 9 - 20 11  10     Sodium 134 - 144 mmol/L 139  136  137   Potassium 3.5 - 5.2 mmol/L 4.0  4.1  3.8   Chloride 96 - 106 mmol/L 99  100  96   CO2 20 - 29 mmol/L 25  22  13    Calcium  8.7 - 10.2 mg/dL 9.7  89.9  9.6     Lab Results  Component Value Date   CHOL 149 01/03/2024   HDL 56 01/03/2024   LDLCALC 77 01/03/2024   TRIG 83 01/03/2024   CHOLHDL 2.7 01/03/2024    Medications Reviewed Today   Medications were not reviewed in this encounter       Assessment/Plan:   Diabetes: - Currently controlled; goal A1c <7%.  - Reviewed long term cardiovascular and renal outcomes of uncontrolled blood sugar. - Recommend to continue metformin  500 mg BID. - A1C due NOW   Hyperlipidemia/ASCVD Risk Reduction: - Currently uncontrolled with most recent LDL-C of 157 mg/dL above goal < 70 mg/dL given nonadherence to statin. Patient is a good candidate for high intensity statin with multiple ASCVD risk factors. Lipid panel has not been rechecked since increasing statin. Patient interested in starting medication for smoking cessation. - Reviewed long term complications of uncontrolled cholesterol - Reviewed lifestyle recommendations to lower LDL-C including  regular physical activity, 5-10% weight loss, eating a diet low in saturated fat, and increasing intake of fiber to at least 25 g per day. - Recommend to continue atorvastatin  40 mg daily  - Placed orders for repeat lipid panel today - Recommend to START varenicline  0.5 mg daily for 3 days, then 0.5 mg twice daily for 4 days, then 1 mg twice daily thereafter. Counseled to take with food to reduce risk of GI adverse effects. Also counseled to stop therapy and contact provider if change in mood.     HFrEF and HTN: - HTN currently uncontrolled with home and clinic BP consistently above goal < 130/80 mmHg. He is not symptomatic from a HF standpoint. Patient has a diagnosis of hyperaldosteronism, but appears spironolactone  was discontinued in 2022 after he was hospitalized with an AKI in the setting of dehydration. May be appropriate to retrial low dose MRA in the future if BP continues to be uncontrolled, but will defer to cardiology. Noted that patient has not had  BMP since starting valsartan  in November. Will obtain at PCP appt on 01/03/24.  - Reviewed appropriate blood pressure monitoring technique and reviewed goal blood pressure - Reviewed dietary modifications including limiting salt intake - Recommend to continue carvedilol  25 mg BID, valsartan  160 mg BID, isosorbide  mononitrate 60 mg daily  - Counseled on importance of adherence to taking BID medications as prescribed - Placed orders for BMP today   Patient verbalized understanding of treatment plan. Assisted in requesting refill of Keppra , and asked pharmacy to fill isosorbide  and higher dose of atorvastatin  today.    Follow Up Plan:  Pharmacist telephone 02/04/24 PCP clinic visit in 01/03/24   Lorain Baseman, PharmD [facility] [phone]   "

## 2024-02-04 NOTE — Telephone Encounter (Signed)
 Attempted to contact patient for scheduled appointment for medication management. Call cannot be completed as dialed. Unable to leave VM. Phone may be disconnected. Will attempt to reschedule.  Lorain Baseman, PharmD [facility] [phone]

## 2024-03-02 ENCOUNTER — Ambulatory Visit (HOSPITAL_COMMUNITY): Payer: MEDICAID

## 2024-04-02 ENCOUNTER — Ambulatory Visit: Payer: Self-pay | Admitting: Nurse Practitioner

## 2024-04-17 ENCOUNTER — Ambulatory Visit: Payer: MEDICAID | Admitting: Cardiology
